# Patient Record
Sex: Female | Born: 1970
Health system: Southern US, Community
[De-identification: ages and names within clinical notes are randomized; demographics above are authoritative.]

## PROBLEM LIST (undated history)

## (undated) DIAGNOSIS — F329 Major depressive disorder, single episode, unspecified: Secondary | ICD-10-CM

## (undated) DIAGNOSIS — M1990 Unspecified osteoarthritis, unspecified site: Secondary | ICD-10-CM

## (undated) DIAGNOSIS — I959 Hypotension, unspecified: Secondary | ICD-10-CM

## (undated) DIAGNOSIS — M797 Fibromyalgia: Secondary | ICD-10-CM

## (undated) DIAGNOSIS — G43909 Migraine, unspecified, not intractable, without status migrainosus: Secondary | ICD-10-CM

## (undated) DIAGNOSIS — F32A Depression, unspecified: Secondary | ICD-10-CM

## (undated) DIAGNOSIS — D259 Leiomyoma of uterus, unspecified: Secondary | ICD-10-CM

## (undated) DIAGNOSIS — K589 Irritable bowel syndrome without diarrhea: Secondary | ICD-10-CM

## (undated) DIAGNOSIS — J351 Hypertrophy of tonsils: Secondary | ICD-10-CM

## (undated) DIAGNOSIS — J45909 Unspecified asthma, uncomplicated: Secondary | ICD-10-CM

## (undated) DIAGNOSIS — F419 Anxiety disorder, unspecified: Secondary | ICD-10-CM

## (undated) DIAGNOSIS — F319 Bipolar disorder, unspecified: Secondary | ICD-10-CM

## (undated) DIAGNOSIS — D649 Anemia, unspecified: Secondary | ICD-10-CM

## (undated) DIAGNOSIS — F4310 Post-traumatic stress disorder, unspecified: Secondary | ICD-10-CM

## (undated) DIAGNOSIS — G4730 Sleep apnea, unspecified: Secondary | ICD-10-CM

## (undated) DIAGNOSIS — K219 Gastro-esophageal reflux disease without esophagitis: Secondary | ICD-10-CM

## (undated) DIAGNOSIS — M109 Gout, unspecified: Secondary | ICD-10-CM

## (undated) HISTORY — DX: Fibromyalgia: M79.7

## (undated) HISTORY — PX: ENDOMETRIAL ABLATION: SHX621

## (undated) HISTORY — PX: TUBAL LIGATION: SHX77

## (undated) HISTORY — DX: Gout, unspecified: M10.9

## (undated) HISTORY — DX: Unspecified osteoarthritis, unspecified site: M19.90

## (undated) HISTORY — DX: Unspecified asthma, uncomplicated: J45.909

---

## 2000-12-05 ENCOUNTER — Other Ambulatory Visit: Admission: RE | Admit: 2000-12-05 | Discharge: 2000-12-05 | Payer: Self-pay | Admitting: Obstetrics and Gynecology

## 2001-02-05 ENCOUNTER — Ambulatory Visit (HOSPITAL_COMMUNITY): Admission: RE | Admit: 2001-02-05 | Discharge: 2001-02-05 | Payer: Self-pay | Admitting: Obstetrics and Gynecology

## 2001-02-05 ENCOUNTER — Encounter: Payer: Self-pay | Admitting: Obstetrics and Gynecology

## 2001-03-01 ENCOUNTER — Ambulatory Visit (HOSPITAL_COMMUNITY): Admission: RE | Admit: 2001-03-01 | Discharge: 2001-03-01 | Payer: Self-pay | Admitting: Obstetrics and Gynecology

## 2001-04-06 ENCOUNTER — Ambulatory Visit (HOSPITAL_COMMUNITY): Admission: AD | Admit: 2001-04-06 | Discharge: 2001-04-06 | Payer: Self-pay | Admitting: Obstetrics and Gynecology

## 2001-04-19 ENCOUNTER — Ambulatory Visit (HOSPITAL_COMMUNITY): Admission: AD | Admit: 2001-04-19 | Discharge: 2001-04-19 | Payer: Self-pay | Admitting: Obstetrics and Gynecology

## 2001-04-27 ENCOUNTER — Inpatient Hospital Stay (HOSPITAL_COMMUNITY): Admission: RE | Admit: 2001-04-27 | Discharge: 2001-04-29 | Payer: Self-pay | Admitting: Obstetrics and Gynecology

## 2001-06-11 ENCOUNTER — Ambulatory Visit (HOSPITAL_COMMUNITY): Admission: RE | Admit: 2001-06-11 | Discharge: 2001-06-11 | Payer: Self-pay | Admitting: Obstetrics and Gynecology

## 2001-08-28 ENCOUNTER — Encounter (HOSPITAL_COMMUNITY): Admission: RE | Admit: 2001-08-28 | Discharge: 2001-08-28 | Payer: Self-pay | Admitting: Preventative Medicine

## 2001-08-28 ENCOUNTER — Encounter (HOSPITAL_COMMUNITY): Admission: RE | Admit: 2001-08-28 | Discharge: 2001-09-27 | Payer: Self-pay | Admitting: Preventative Medicine

## 2001-11-29 ENCOUNTER — Inpatient Hospital Stay (HOSPITAL_COMMUNITY): Admission: EM | Admit: 2001-11-29 | Discharge: 2001-12-07 | Payer: Self-pay | Admitting: Psychiatry

## 2005-07-13 ENCOUNTER — Ambulatory Visit (HOSPITAL_COMMUNITY): Admission: RE | Admit: 2005-07-13 | Discharge: 2005-07-13 | Payer: Self-pay | Admitting: Obstetrics and Gynecology

## 2005-09-09 ENCOUNTER — Ambulatory Visit (HOSPITAL_COMMUNITY): Admission: RE | Admit: 2005-09-09 | Discharge: 2005-09-09 | Payer: Self-pay | Admitting: Obstetrics and Gynecology

## 2009-04-30 ENCOUNTER — Other Ambulatory Visit: Admission: RE | Admit: 2009-04-30 | Discharge: 2009-04-30 | Payer: Self-pay | Admitting: Obstetrics and Gynecology

## 2010-12-03 NOTE — Op Note (Signed)
[facility]  Patient:    [name], [name] Visit Number: [id number] MRN: [id number]          Service Type: OBS Location: 4 A417 01 Attending Physician:  Tilda Burrow Dictated by:   Christin Bach, M.D. Proc. Date: 04/27/01 Admit Date:  04/27/2001 Discharge Date: 04/29/2001                             Operative Report  DELIVERY NOTE  DATE OF DELIVERY:  9:15 p.m.  DESCRIPTION OF PROCEDURE:  Ms. Ord progressed nicely reaching completely dilated at approximately 9 p.m.  A brief second stage of less than 15 minutes resulted in spontaneous vertex vaginal delivery.  The infant started second stage in the right occipitoposterior position and rotated spontaneously to right occipitoanterior as the baby descending with the patient pushing with excellent effort while placing legs in the McRoberts flexed position.  She delivered over an intact perineum a healthy female infant, Apgars 8/8, -1 for color and tone at 5 minutes.  The baby required a little extra oxygen due to transient cyanosis and decreased tone.  The baby responded to tactile stimulation with adequate response.  Mother and infant did well.  Placenta delivered easily.  Tomasa Blase presentation.  Cord blood gases were obtained and are pending at the time of this dictation. Dictated by:   Christin Bach, M.D. Attending Physician:  Tilda Burrow DD:  04/27/01 TD:  04/29/01 Job: [id number]

## 2010-12-03 NOTE — Discharge Summary (Signed)
[facility]  Patient:    [name], [name] Visit Number: [id number] MRN: [id number]          Service Type: OBS Location: 4 A417 01 Attending Physician:  Tilda Burrow Dictated by:   Christin Bach, M.D. Admit Date:  04/27/2001 Discharge Date: 04/29/2001                             Discharge Summary  ADMITTING DIAGNOSIS:  Pregnancy [redacted] weeks gestation, latent phase labor.  DISCHARGE DIAGNOSIS:  Pregnancy 38 weeks, delivered.  PROCEDURE:  Spontaneous vertex vaginal delivery.  Intact perineum, 7 pounds 0.2 ounce female infant, Apgars 7 and 9.  DISCHARGE MEDICATIONS: 1. Motrin 800 mg 1 p.o. q.8h. as needed for pain. 2. Future contraception.  PLAN:  Postpartum tubal ligation planned.  HISTORY OF PRESENT ILLNESS:  This 40 year old female gravida 4, para 2, AB 1, due July 03, 2001, was admitted on April 27, 2001, with regular uterine contractions of mild nature every 3 minutes.  Cervix was 2 cm 20% soft, posterior, easily stretchable.  The patient was admitted for labor management and Pitocin augmentation of labor.  HOSPITAL COURSE:  The patient was admitted and progressed nicely through labor reaching completely dilated at shortly before 9 a.m. and delivered at 9:15 a.m. over an intact perineum, delivered a 7 pound 0.2 ounce baby with little effort.  The patient had only been in the hospital for only 3 hours.  She was admitted at 6 p.m.  POSTPARTUM COURSE:  Uneventful with patient staying 2 days, breast feeding. Was discharged for follow up in 4 weeks for postpartum tubal ligation. Dictated by:   Christin Bach, M.D. Attending Physician:  Tilda Burrow DD:  06/03/01 TD:  06/03/01 Job: [id number]

## 2010-12-03 NOTE — Op Note (Signed)
[facility]  Patient:    [name], [name] Visit Number: [id number] MRN: [id number]          Service Type: OBS Location: 4 A417 01 Attending Physician:  Tilda Burrow Dictated by:   Christin Bach, M.D. Admit Date:  04/27/2001 Discharge Date: 04/29/2001                             Operative Report  PREOPERATIVE DIAGNOSES:  Elective sterilization.  POSTOPERATIVE DIAGNOSES:  Elective sterilization.  PROCEDURE:  Laparoscopic tubal sterilization with Falope rings.  SURGEON:  Christin Bach, M.D.  ASSISTANT:  None.  ANESTHESIA:  General.  COMPLICATIONS:  None.  FINDINGS:  Normal-appearing tubes and ovaries bilaterally.  Video camera used for procedure to allow visualization by students in the room.  INDICATION:  Elective permanent sterilization.  DETAILS OF PROCEDURE:  The patient was taken to the operating room, prepped and draped for a combined abdominal and vaginal procedure, with Hulka tenaculum attached to the cervix for uterine manipulation. Bladder in-and-out catheterization. An infraumbilical, 1 cm vertical incision, as well as a transverse suprapubic 1 cm incision. Veress needle was used to introduce pneumoperitoneum through the umbilical incision with the pneumoperitoneum easily introduced under 10 mmHg of pressure. Introduction of the Veress needle was done, carefully elevating the abdominal wall and orienting the needle toward the pelvis.  The laparoscopic trocar was then carefully introduced into the abdomen using a similar technique, and the laparoscope was used to visualize normal pelvic anatomy with no evidence of bleeding or trauma. The suprapubic trocar was introduced under direct visualization, and then attention was directed to the left fallopian tube, which was identified up to its fimbriated end, elevated and a mid-segment loop of the tube was drawn up into the Falope ring applier, Marcaine 0.25% applied to the  surface of the tube and the Falope ring applied, inspected, and found to be in satisfactory position. The opposite tube was then treated in a similar fashion. The mesosalpinx beneath the Falope ring on each side was then infiltrated with approximately 3 cc of Marcaine 0.25%, using a transabdominal approach with a 22-gauge spinal needle.  Then, the laparoscopic equipment was removed after instilling 200 cc of saline into the abdomen and deflating the abdomen. Subcuticular 4-0 Dexon was used to close the skin incisions and Steri-Strips was placed on the skin surface. Sponge and needle counts were correct. The patient tolerated the procedure well, was awakened, and went to the recovery room in good condition. Dictated by:   Christin Bach, M.D. Attending Physician:  Tilda Burrow DD:  06/11/01 TD:  06/11/01 Job: [id number]

## 2010-12-03 NOTE — H&P (Signed)
[facility]  Patient:    [name], [name] Visit Number: [id number] MRN: [id number]          Service Type: OBS Location: 4 A417 01 Attending Physician:  Tilda Burrow Dictated by:   Christin Bach, M.D. Admit Date:  04/27/2001 Discharge Date: 04/29/2001                           History and Physical  PREOPERATIVE DIAGNOSES:  Elective sterilization.  HISTORY OF PRESENT ILLNESS:  This 40 year old female gravida 4, para 3, AB1 recently status post vaginal delivery April 27, 2001 is admitted at this time for elective permanent sterilization by Falope ring application.  The patient understands the failure rates of the requested procedure is 1 in 100. The patient has been abstinent since delivery.  Confirms again her desire for permanent sterilization.  She has signed appropriate medicaid sterilization forms May 02, 2001 and affirms that her attitude remains unchanged. Technical aspects of the procedure have been reviewed with 1 in 100 failure rate quoted.  PAST MEDICAL HISTORY:  History of HPV, history of HSV (not currently active), asthma in the past.  PAST SURGICAL HISTORY:  Negative.  ALLERGIES:  PENICILLIN causes rash.  PHYSICAL EXAMINATION  VITAL SIGNS:  Height 5 feet 9 inches, weight 195 pounds, blood pressure 100/60.  HEENT:  Pupils are equal, round and reactive.  Extraocular movements are intact.  NECK:  Supple.  Trachea midline.  CHEST:  Clear to auscultation.  ABDOMEN:  Nontender.  PELVIC:  External genitalia:  Multiparous, well supported.  Vaginal examination normal, good length, normal postpartum lochia.  Atrophic vaginal tissues as the patient is breast-feeding.  Cervix:  Multiparous.  Uterus: Mobile, deep in pelvis, involuting nicely, upper limits normal size for postpartum state.  Adnexa:  Negative for masses.  LABORATORIES:  Wet prep negative.  GC, chlamydia pending.  ASSESSMENT:  Desire for elective permanent  sterilization.  PLAN:  Falope ring application June 11, 2001. Dictated by:   Christin Bach, M.D. Attending Physician:  Tilda Burrow DD:  06/05/01 TD:  06/05/01 Job: [id number]

## 2010-12-03 NOTE — Op Note (Signed)
[name], [name]           ACCOUNT NO.:  [id number]   MEDICAL RECORD NO.:  [id number]          PATIENT TYPE:  AMB   LOCATION:  DAY                           FACILITY:  [facility]   PHYSICIAN:  Tilda Burrow, M.D. DATE OF BIRTH:  January 25, 1971   DATE OF PROCEDURE:  09/09/2005  DATE OF DISCHARGE:                                 OPERATIVE REPORT   PREOPERATIVE DIAGNOSIS:  Menometrorrhagia.   POSTOPERATIVE DIAGNOSIS:  Menometrorrhagia.   PROCEDURE:  Hysteroscopy, endometrial ablation.   SURGEON:  Tilda Burrow, M.D.   ASSISTANT:  None.   ANESTHESIA:  General with laryngeal mask airway.   COMPLICATIONS:  None.   FINDINGS:  Retroverted uterus sounding to 9 cm. Thin atrophic endometrial  cavity.   DETAILS OF PROCEDURE:  The patient was taken to the operating room, prepped  and draped for a vaginal procedure. In and out catheterization of the  bladder removed 100+ cc of urine. Anterior cervical lip was grasped with a  single-toothed tenaculum. Cervix sounded in the retroverted position to 9  cm, dilated to 25 [place]. The hysteroscope was then introduced, visualizing  the tubal ostial as photographed in photos 1 and 2. The contours of the  uterine cavity were quite smooth with no tissue warranting curettage. We  then proceeded with endometrial ablation. The ablation consisted of placing  the balloon at a depth of 9 cm, inflating it to approximately 15 cc of fluid  volume. The uterus had soft, boggy texture and had to have several  continuations of fluid, as it filled initially about 10 cc of fluid volume  and then gradually would soften. At this time, we were able to stabilize her  to approximately 150 mmHg and proceed with endometrial ablation. The 8-  minute thermal ablation sequence was completed after the water reached the  appropriate 87 degrees Fahrenheit temperature. At the completion of the  procedure, the amount of fluid was completely recovered. Paracervical block  had been applied at the start of the procedure with Marcaine  with epinephrine. The patient allowed to go to the recovery room in good  condition. Sponge and needle counts correct.   Discharge medications include Tylox x20 tablets and Motrin 800 mg x20  tablets.      Tilda Burrow, M.D.  Electronically Signed     JVF/MEDQ  D:  09/09/2005  T:  09/09/2005  Job:  [id number]   cc:   [facility]

## 2010-12-03 NOTE — H&P (Signed)
NAMEKRISTENE, [name]           ACCOUNT NO.:  [id number]   MEDICAL RECORD NO.:  [id number]          PATIENT TYPE:  AMB   LOCATION:  DAY                           FACILITY:  [facility]   PHYSICIAN:  Tilda Burrow, M.D. DATE OF BIRTH:  09/10/1970   DATE OF ADMISSION:  09/09/2005  DATE OF DISCHARGE:  LH                                HISTORY & PHYSICAL   ADMISSION DIAGNOSIS:  Menorrhagia requesting endometrial ablation.   HISTORY OF PRESENT ILLNESS:  This 40 year old female is admitted at this  time for endometrial ablation.  [name] has been followed through our office  and requested endometrial ablation due to heavy menses which she finds  debilitating.  She has been seen in our office complaining of irregular  cycles.  She is status post tubal ligation and she requires oral  contraceptives to control menses.  She wishes to get off these oral  contraceptives due to perception of increased breast discomfort while on  oral contraceptives.  She bleeds up to 16 days with tampons and pads both  required when off oral contraceptives.  She has a history of small uterine  fibroids in the past.   PAST MEDICAL HISTORY:  Anxiety disorder and bipolar type 2, managed by Dr.  Omelia Blackwater.  She currently feels normal.   MEDICATIONS:  1.  Geodon.  2.  Xanax 3 mg per day.  3.  Lamictal 200 mg per day.  4.  Zonegran.   SOCIAL HISTORY:  She is not working.  She is in the school system learning  to speak Spanish.  She has reviewed the endometrial ablation technique using  video tapes and brochures.  Vaginal ultrasound has been performed in our  office showing a 9.2 cm long uterus with a 9 mm, thick, endometrial stripe  during the follicular phase.   PHYSICAL EXAMINATION:  PELVIC:  She has left adnexal ovaries that are 3.3 x  2.7 cm.  Right adnexa with smaller ovaries with no masses.  There is no  gross fibroid deformity of the uterus.   IMPRESSION:  Menorrhagia warranting endometrial ablation.   PLAN:  Hysteroscopy with endometrial ablation to be performed on February  23, at noon.      Tilda Burrow, M.D.  Electronically Signed     JVF/MEDQ  D:  09/06/2005  T:  09/06/2005  Job:  [id number]   cc:   [facility]   Omelia Blackwater, M.D.

## 2010-12-03 NOTE — Discharge Summary (Signed)
[facility]  Patient:    [name], [name] Visit Number: [id number] MRN: [id number]          Service Type: PSY Location: 300 0302 01 Attending Physician:  Jeanice Lim Dictated by:   Reymundo Poll Dub Mikes, M.D. Admit Date:  11/29/2001 Discharge Date: 12/07/2001                             Discharge Summary  CHIEF COMPLAINT AND PRESENT ILLNESS:  This was the first admission to [facility] for this 40 year old female voluntarily admitted.  Two children by her husband.  Separated from the husband 10 months prior to this admission.  No support.  She found out that he has been supporting another child at least 1-2 years.  History of depression with suicidal attempts.  Not treated.  On Effexor since Nov 15, 2001.  Tolerating okay but not effective enough.  Homicidal thoughts over the husband.  Decreased concentration, cannot stop thinking about him, increased tearfulness.  Denies suicidal ideation.  PAST PSYCHIATRIC HISTORY:  Medications by her OB.  Celexa while pregnant. Prozac up to 40 mg, did good, Effexor, worked, could not start.  ALCOHOL/DRUG HISTORY:  Denies the use or abuse of any substances.  MEDICAL HISTORY:  The patient has bouts of asthma, active herpes II.  MEDICATIONS:  Prenatal vitamins, Effexor XR 75 mg daily.  PHYSICAL EXAMINATION:  Performed and failed to show any acute findings.  MENTAL STATUS EXAMINATION:  Healthy, African-American female, fully alert. Speech normal, no pressure, articulates spontaneous.  Mood depressed, angry. Affect depressed, angry.  Homicidal ideation towards her husband.  No suicidal ideation.  No auditory or visual hallucinations.  Cognition well-preserved.  ADMISSION DIAGNOSES: Axis I:    Major depression, recurrent, severe. Axis II:   No diagnosis. Axis III:  1. Breast-feeding mother.            2. Herpes II. Axis IV:   Moderate. Axis V:    Global Assessment of Functioning upon  admission 30; highest Global            Assessment of Functioning in the last year 66-70.  LABORATORY DATA:  CBC was within normal limits.  Blood chemistries were within normal limits.  Thyroid profile was within normal limits.  Drug screen was negative for substances of abuse.  HOSPITAL COURSE:  She was admitted and started intensive individual and group psychotherapy.  She was kept on Effexor 75 mg per day and Effexor was increased to 112.5 mg per day.  She was given Ambien for sleep.  She started working in intensive individual and group psychotherapy.  She was working on the grief of losing this relationship, worked on self and self-esteem, on her cognitive distortions, dealing with the feelings toward the husband. Everytime she thought about the situation with her husband, she became very upset.  Tried to get in touch with the husband but he was not available. Slowly, she started accepting the fact that the relationship was pretty much over, started grieving the loss.  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  By Dec 06, 2001, she had decided that she had to let go of the husband, suddenly realized that she had to move on.  On Dec 07, 2001, she claimed that she had done a lot of work and grown up, matured.  While she was in the hospital, she did some grief work, Pharmacologist, challenged her cognitive distortions.  No suicidal  ideation.  No homicidal ideation.  Willing and motivated to pursue outpatient treatment.  DISCHARGE DIAGNOSES: Axis I:    Major depression, recurrent. Axis II:   No diagnosis. Axis III:  1. Breast-feeding mother.            2. Herpes II. Axis IV:   Moderate. Axis V:    Global Assessment of Functioning upon discharge 55-60.  DISCHARGE MEDICATIONS: 1. Effexor XR 112.5 mg daily. 2. Valtrex 500 mg every 12 hours. 3. Ativan 0.5 mg every six hours as needed for anxiety. 4. Ambien as needed for sleep.  FOLLOW-UP:  Dr. Milford Cage and Dr. ________ in the _______  clinic. Dictated by:   Reymundo Poll Dub Mikes, M.D. Attending Physician:  Jeanice Lim DD:  01/09/02 TD:  01/10/02 Job: [id number]

## 2011-12-20 ENCOUNTER — Other Ambulatory Visit: Payer: Self-pay | Admitting: Obstetrics and Gynecology

## 2011-12-20 DIAGNOSIS — Z139 Encounter for screening, unspecified: Secondary | ICD-10-CM

## 2012-01-03 ENCOUNTER — Ambulatory Visit (HOSPITAL_COMMUNITY)
Admission: RE | Admit: 2012-01-03 | Discharge: 2012-01-03 | Disposition: A | Discharge status: Home / Self Care | Payer: Medicare Other | Source: Ambulatory Visit | Attending: Obstetrics and Gynecology | Admitting: Obstetrics and Gynecology

## 2012-01-03 DIAGNOSIS — Z1231 Encounter for screening mammogram for malignant neoplasm of breast: Secondary | ICD-10-CM | POA: Insufficient documentation

## 2012-01-03 DIAGNOSIS — Z139 Encounter for screening, unspecified: Secondary | ICD-10-CM

## 2012-11-26 ENCOUNTER — Other Ambulatory Visit: Payer: Self-pay | Admitting: Obstetrics and Gynecology

## 2012-11-26 DIAGNOSIS — Z139 Encounter for screening, unspecified: Secondary | ICD-10-CM

## 2013-01-07 ENCOUNTER — Ambulatory Visit (HOSPITAL_COMMUNITY)
Admission: RE | Admit: 2013-01-07 | Discharge: 2013-01-07 | Disposition: A | Discharge status: Home / Self Care | Payer: Medicare Other | Source: Ambulatory Visit | Attending: Obstetrics and Gynecology | Admitting: Obstetrics and Gynecology

## 2013-01-07 DIAGNOSIS — Z1231 Encounter for screening mammogram for malignant neoplasm of breast: Secondary | ICD-10-CM | POA: Insufficient documentation

## 2013-01-07 DIAGNOSIS — Z139 Encounter for screening, unspecified: Secondary | ICD-10-CM

## 2013-10-05 ENCOUNTER — Emergency Department (HOSPITAL_COMMUNITY)
Admission: EM | Admit: 2013-10-05 | Discharge: 2013-10-06 | Disposition: A | Discharge status: Other Acute Inpatient Hospital | Payer: Medicare Other | Attending: Emergency Medicine | Admitting: Emergency Medicine

## 2013-10-05 ENCOUNTER — Encounter (HOSPITAL_COMMUNITY): Payer: Self-pay | Admitting: Emergency Medicine

## 2013-10-05 DIAGNOSIS — Z8719 Personal history of other diseases of the digestive system: Secondary | ICD-10-CM | POA: Insufficient documentation

## 2013-10-05 DIAGNOSIS — R443 Hallucinations, unspecified: Secondary | ICD-10-CM | POA: Insufficient documentation

## 2013-10-05 DIAGNOSIS — F911 Conduct disorder, childhood-onset type: Secondary | ICD-10-CM | POA: Insufficient documentation

## 2013-10-05 DIAGNOSIS — F329 Major depressive disorder, single episode, unspecified: Secondary | ICD-10-CM

## 2013-10-05 DIAGNOSIS — R4589 Other symptoms and signs involving emotional state: Secondary | ICD-10-CM | POA: Insufficient documentation

## 2013-10-05 DIAGNOSIS — F32A Depression, unspecified: Secondary | ICD-10-CM

## 2013-10-05 DIAGNOSIS — F411 Generalized anxiety disorder: Secondary | ICD-10-CM | POA: Insufficient documentation

## 2013-10-05 DIAGNOSIS — H5316 Psychophysical visual disturbances: Secondary | ICD-10-CM | POA: Insufficient documentation

## 2013-10-05 DIAGNOSIS — Z79899 Other long term (current) drug therapy: Secondary | ICD-10-CM | POA: Insufficient documentation

## 2013-10-05 DIAGNOSIS — R0602 Shortness of breath: Secondary | ICD-10-CM | POA: Insufficient documentation

## 2013-10-05 DIAGNOSIS — IMO0002 Reserved for concepts with insufficient information to code with codable children: Secondary | ICD-10-CM | POA: Insufficient documentation

## 2013-10-05 DIAGNOSIS — Z3202 Encounter for pregnancy test, result negative: Secondary | ICD-10-CM | POA: Insufficient documentation

## 2013-10-05 DIAGNOSIS — G43909 Migraine, unspecified, not intractable, without status migrainosus: Secondary | ICD-10-CM | POA: Insufficient documentation

## 2013-10-05 DIAGNOSIS — F430 Acute stress reaction: Secondary | ICD-10-CM | POA: Insufficient documentation

## 2013-10-05 DIAGNOSIS — Z880 Allergy status to penicillin: Secondary | ICD-10-CM | POA: Insufficient documentation

## 2013-10-05 DIAGNOSIS — F319 Bipolar disorder, unspecified: Secondary | ICD-10-CM | POA: Insufficient documentation

## 2013-10-05 DIAGNOSIS — Z8742 Personal history of other diseases of the female genital tract: Secondary | ICD-10-CM | POA: Insufficient documentation

## 2013-10-05 DIAGNOSIS — G479 Sleep disorder, unspecified: Secondary | ICD-10-CM | POA: Insufficient documentation

## 2013-10-05 DIAGNOSIS — Z862 Personal history of diseases of the blood and blood-forming organs and certain disorders involving the immune mechanism: Secondary | ICD-10-CM | POA: Insufficient documentation

## 2013-10-05 HISTORY — DX: Depression, unspecified: F32.A

## 2013-10-05 HISTORY — DX: Anemia, unspecified: D64.9

## 2013-10-05 HISTORY — DX: Hypotension, unspecified: I95.9

## 2013-10-05 HISTORY — DX: Irritable bowel syndrome, unspecified: K58.9

## 2013-10-05 HISTORY — DX: Migraine, unspecified, not intractable, without status migrainosus: G43.909

## 2013-10-05 HISTORY — DX: Anxiety disorder, unspecified: F41.9

## 2013-10-05 HISTORY — DX: Bipolar disorder, unspecified: F31.9

## 2013-10-05 HISTORY — DX: Major depressive disorder, single episode, unspecified: F32.9

## 2013-10-05 HISTORY — DX: Leiomyoma of uterus, unspecified: D25.9

## 2013-10-05 LAB — CBC WITH DIFFERENTIAL/PLATELET
Basophils Absolute: 0 10*3/uL (ref 0.0–0.1)
Basophils Relative: 0 % (ref 0–1)
Eosinophils Absolute: 0 10*3/uL (ref 0.0–0.7)
Eosinophils Relative: 1 % (ref 0–5)
HEMATOCRIT: 43.3 % (ref 36.0–46.0)
Hemoglobin: 14.4 g/dL (ref 12.0–15.0)
LYMPHS PCT: 36 % (ref 12–46)
Lymphs Abs: 2.4 10*3/uL (ref 0.7–4.0)
MCH: 30.7 pg (ref 26.0–34.0)
MCHC: 33.3 g/dL (ref 30.0–36.0)
MCV: 92.3 fL (ref 78.0–100.0)
MONO ABS: 0.5 10*3/uL (ref 0.1–1.0)
MONOS PCT: 7 % (ref 3–12)
NEUTROS ABS: 3.7 10*3/uL (ref 1.7–7.7)
Neutrophils Relative %: 56 % (ref 43–77)
Platelets: 177 10*3/uL (ref 150–400)
RBC: 4.69 MIL/uL (ref 3.87–5.11)
RDW: 14.3 % (ref 11.5–15.5)
WBC: 6.6 10*3/uL (ref 4.0–10.5)

## 2013-10-05 LAB — PREGNANCY, URINE: Preg Test, Ur: NEGATIVE

## 2013-10-05 LAB — RAPID URINE DRUG SCREEN, HOSP PERFORMED
AMPHETAMINES: NOT DETECTED
BARBITURATES: NOT DETECTED
BENZODIAZEPINES: POSITIVE — AB
Cocaine: NOT DETECTED
Opiates: NOT DETECTED
Tetrahydrocannabinol: NOT DETECTED

## 2013-10-05 LAB — BASIC METABOLIC PANEL
BUN: 11 mg/dL (ref 6–23)
CHLORIDE: 106 meq/L (ref 96–112)
CO2: 25 meq/L (ref 19–32)
Calcium: 9.3 mg/dL (ref 8.4–10.5)
Creatinine, Ser: 0.99 mg/dL (ref 0.50–1.10)
GFR calc Af Amer: 81 mL/min — ABNORMAL LOW (ref >90)
GFR calc non Af Amer: 70 mL/min — ABNORMAL LOW (ref >90)
GLUCOSE: 93 mg/dL (ref 70–99)
Potassium: 4.3 mEq/L (ref 3.7–5.3)
Sodium: 141 mEq/L (ref 137–147)

## 2013-10-05 LAB — ETHANOL: Alcohol, Ethyl (B): <11 mg/dL (ref 0–11)

## 2013-10-05 MED ORDER — LORAZEPAM 1 MG PO TABS
1.0000 mg | ORAL_TABLET | Freq: Once | ORAL | Status: AC
  Administered 2013-10-05: 1 mg via ORAL
  Filled 2013-10-05: qty 1

## 2013-10-05 NOTE — ED Notes (Signed)
Mother taking all pt belongings home including a ring, clothes, and purse.

## 2013-10-05 NOTE — ED Notes (Signed)
Pt complaining of anxiety. Dr. Dina Rich made aware.

## 2013-10-05 NOTE — ED Notes (Signed)
Pt has decreased anxiety since ativan. Pt requesting regular PM medication. MD made aware

## 2013-10-05 NOTE — ED Provider Notes (Addendum)
CSN: [id number]     Arrival date & time 10/05/13  1501 History  This chart was scribed for Jordan Hacker, MD by Zettie Pho, ED Scribe. This patient was seen in room APA16A/APA16A and the patient's care was started at 4:52 PM.    Chief Complaint  Patient presents with  . V70.1   The history is provided by the patient. No language interpreter was used.   HPI Comments: [name] is a 43 y.o. Female with a history of bipolar disorder, depression, anxiety, migraines who presents to the Emergency Department requesting a psychiatric evaluation for a "nervous breakdown" secondary to environmental stressors (i.e. Financial, children, etc.). Patient states that she has felt angry and frustrated lately, especially at her children, but denies suicidal or homicidal ideations. She reports that she recently had an episode of mania that she states lasted about 2 weeks. Patient also reports that she will intermittently not sleep for several days at a time, then sleep excessively. She reports some associated auditory and visual hallucinations that she states only presents during times of sleep deprivation and are paranoid in nature. She reports some associated shortness of breath secondary to her anxiety. She reports that she has not been able to follow up with her psychiatrist (Dr. Bernita Raisin) due to complications with her health insurance. Patient reports that she takes Zonisamide, Alprazolam, zolpridem tartrate, and Prestique daily, which she states she has been compliant with. She denies any other drug or alcohol use.  Patient also has a history of IBS, hypotension, anemia, and uterine fibroid.  Past Medical History  Diagnosis Date  . IBS (irritable bowel syndrome)   . Bipolar disorder   . Depression   . Anxiety   . Migraine   . Hypotension   . Anemia   . Uterine fibroid    Past Surgical History  Procedure Laterality Date  . Endometrial ablation    . Tubal ligation     No family  history on file. History  Substance Use Topics  . Smoking status: Never Smoker   . Smokeless tobacco: Not on file  . Alcohol Use: No   OB History   Grav Para Term Preterm Abortions TAB SAB Ect Mult Living                 Review of Systems  Constitutional: Negative for fever.  Respiratory: Negative for cough, chest tightness and shortness of breath.   Cardiovascular: Negative for chest pain.  Gastrointestinal: Negative for nausea, vomiting and abdominal pain.  Genitourinary: Negative for dysuria.  Musculoskeletal: Negative for back pain.  Skin: Negative for wound.  Neurological: Negative for headaches.  Psychiatric/Behavioral: Positive for hallucinations, sleep disturbance and agitation. Negative for suicidal ideas and confusion. The patient is nervous/anxious.   All other systems reviewed and are negative.      Allergies  Penicillins and Sweet potato  Home Medications   Current Outpatient Rx  Name  Route  Sig  Dispense  Refill  . ABILIFY MAINTENA 400 MG SUSR   Intramuscular   Inject 400 mg into the muscle every 21 ( twenty-one) days.         Marland Kitchen alprazolam (XANAX) 2 MG tablet   Oral   Take 2 mg by mouth 3 (three) times daily as needed.         Marland Kitchen amantadine (SYMMETREL) 100 MG capsule   Oral   Take 100 mg by mouth daily.         . fluticasone (FLONASE) 50  MCG/ACT nasal spray   Each Nare   Place 1-2 sprays into both nostrils daily as needed.         Marland Kitchen HYDROcodone-acetaminophen (NORCO/VICODIN) 5-325 MG per tablet   Oral   Take 1 tablet by mouth every 8 (eight) hours as needed. For pain         . omeprazole (PRILOSEC) 20 MG capsule   Oral   Take 20 mg by mouth 2 (two) times daily.         Marland Kitchen PRISTIQ 50 MG 24 hr tablet   Oral   Take 50 mg by mouth daily.         . propranolol (INDERAL) 40 MG tablet   Oral   Take 40 mg by mouth 2 (two) times daily.         . SUMAtriptan (IMITREX) 50 MG tablet   Oral   Take 50 mg by mouth as needed.          . traZODone (DESYREL) 50 MG tablet   Oral   Take 50-100 mg by mouth at bedtime.         . ziprasidone (GEODON) 80 MG capsule   Oral   Take 80 mg by mouth 2 (two) times daily.         Marland Kitchen zolpidem (AMBIEN) 10 MG tablet   Oral   Take 10 mg by mouth at bedtime as needed. For sleep         . zonisamide (ZONEGRAN) 100 MG capsule   Oral   Take 100 mg by mouth 3 (three) times daily.           Triage Vitals: BP 127/63  Pulse 86  Temp(Src) 97.7 F (36.5 C) (Oral)  Resp 20  SpO2 100%  Physical Exam  Nursing note and vitals reviewed. Constitutional: She is oriented to person, place, and time. No distress.  Anxious, tearful  HENT:  Head: Normocephalic and atraumatic.  Eyes: Pupils are equal, round, and reactive to light.  Neck: Neck supple.  Cardiovascular: Normal rate, regular rhythm and normal heart sounds.   No murmur heard. Pulmonary/Chest: Effort normal and breath sounds normal. No respiratory distress. She has no wheezes.  Abdominal: Soft. Bowel sounds are normal. There is no tenderness. There is no rebound.  Neurological: She is alert and oriented to person, place, and time.  Skin: Skin is warm and dry.  Psychiatric:  Flat affect, appropriate insight    ED Course  Procedures (including critical care time)  DIAGNOSTIC STUDIES: Oxygen Saturation is 100% on room air, normal by my interpretation.    COORDINATION OF CARE: 5:00 PM- Ordered blood labs (CBC, BMP, ethanol) and UA. Discussed treatment plan with patient at bedside and patient verbalized agreement.     Labs Review Labs Reviewed  BASIC METABOLIC PANEL - Abnormal; Notable for the following:    GFR calc non Af Amer 70 (*)    GFR calc Af Amer 81 (*)    All other components within normal limits  URINE RAPID DRUG SCREEN (HOSP PERFORMED) - Abnormal; Notable for the following:    Benzodiazepines POSITIVE (*)    All other components within normal limits  CBC WITH DIFFERENTIAL  ETHANOL  PREGNANCY, URINE    Imaging Review No results found.   EKG Interpretation None      MDM   Final diagnoses:  None   Patient presents with increasing agitation, restlessness, sleeplessness. Patient reports that she feels like she did when she required hospitalization several years ago  for bipolar disorder. She's not able to see her outpatient psychiatrist. She's tearful on exam. She appears to have insight. No active HI or SI at this time. Requesting further evaluation. Will send for screening psych labs. TTS to evaluate.  I personally performed the services described in this documentation, which was scribed in my presence. The recorded information has been reviewed and is accurate.     Jordan Hacker, MD 10/05/13 2012  Patient meets inpatient criteria.  Awaiting placement.  Jordan Hacker, MD 10/05/13 2115
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
## 2013-10-05 NOTE — BH Assessment (Signed)
Received a call for tele-assessment. Spoke with Dr. Dina Rich who stated that pt has a history of bipolar and a recent manic episode. Pt reported she has not been sleeping. Pt is asking for help. No SI/HI reported. Pt reported that the last time she was like this she needed to be hospitalized. Pt has a primary psychiatrist but is no longer able to see him due to her insurance coverage. Tele-assessment will be initiated.

## 2013-10-05 NOTE — ED Notes (Signed)
Telepsych completed.  

## 2013-10-05 NOTE — ED Notes (Signed)
Pt presents to er for further evaluation of "nervous breakdown", pt states that she has had to change doctors, had a change in medications. Feels as if she is losing her support system, has "alot on her" due to finances, dealing with her children, when asked about any thoughts of SI or HI, pt's reply is "I feel anger and have been having a lot of anger and frustration, but would never hurt anyone or myself" pt tearful in triage, admits to problems with her sleep, states that she will have auditory hallucinations when she does not sleep for days at a time,

## 2013-10-06 MED ORDER — ZIPRASIDONE HCL 80 MG PO CAPS
80.0000 mg | ORAL_CAPSULE | Freq: Three times a day (TID) | ORAL | Status: DC
  Filled 2013-10-06 (×5): qty 1

## 2013-10-06 MED ORDER — ZONISAMIDE 100 MG PO CAPS
100.0000 mg | ORAL_CAPSULE | Freq: Three times a day (TID) | ORAL | Status: DC
  Administered 2013-10-06: 100 mg via ORAL
  Filled 2013-10-06 (×8): qty 1

## 2013-10-06 MED ORDER — VENLAFAXINE HCL ER 75 MG PO CP24
ORAL_CAPSULE | ORAL | Status: AC
  Filled 2013-10-06: qty 1

## 2013-10-06 MED ORDER — ZONISAMIDE 100 MG PO CAPS
100.0000 mg | ORAL_CAPSULE | Freq: Once | ORAL | Status: DC
  Filled 2013-10-06: qty 1

## 2013-10-06 MED ORDER — AMANTADINE HCL 100 MG PO CAPS
ORAL_CAPSULE | ORAL | Status: AC
  Filled 2013-10-06: qty 1

## 2013-10-06 MED ORDER — ZIPRASIDONE HCL 80 MG PO CAPS
80.0000 mg | ORAL_CAPSULE | Freq: Two times a day (BID) | ORAL | Status: DC
  Administered 2013-10-06 (×2): 80 mg via ORAL
  Filled 2013-10-06 (×6): qty 1

## 2013-10-06 MED ORDER — ZIPRASIDONE MESYLATE 20 MG IM SOLR: 80.0000 mg | Freq: Two times a day (BID) | INTRAMUSCULAR | Status: DC

## 2013-10-06 MED ORDER — ZIPRASIDONE HCL 80 MG PO CAPS
ORAL_CAPSULE | ORAL | Status: AC
  Filled 2013-10-06: qty 1

## 2013-10-06 MED ORDER — ZOLPIDEM TARTRATE 5 MG PO TABS
10.0000 mg | ORAL_TABLET | Freq: Every evening | ORAL | Status: DC | PRN
  Administered 2013-10-06: 10 mg via ORAL
  Filled 2013-10-06: qty 2

## 2013-10-06 MED ORDER — AMANTADINE HCL 100 MG PO CAPS
100.0000 mg | ORAL_CAPSULE | Freq: Every day | ORAL | Status: DC
  Administered 2013-10-06: 100 mg via ORAL
  Filled 2013-10-06 (×3): qty 1

## 2013-10-06 MED ORDER — VENLAFAXINE HCL ER 75 MG PO CP24
75.0000 mg | ORAL_CAPSULE | Freq: Every day | ORAL | Status: DC
  Filled 2013-10-06 (×2): qty 1

## 2013-10-06 NOTE — ED Notes (Addendum)
Patient allowed to visit with family prior transfer to [facility].

## 2013-10-06 NOTE — Progress Notes (Signed)
MHT initiated psychiatric placement at the following hospitals with bed availability:  1)[facility]-faxed referral 2)[facility]-faxed referral 3)[facility]-faxed referral 4)[facility]-faxed referral 5)[facility]-faxed referral 6)[facility]-faxed referral 7)[facility]-faxed referral.  Has been reviewed with Dr.Chafeur.  If under IVC, callback in AM with bed placement.  Wyvonnia Dusky, MHT/NS

## 2013-10-06 NOTE — ED Notes (Signed)
Received call from [facility], states patient is accepted at [facility] pending IVC paperwork. Per [facility], [facility] requires patient to be IVC.

## 2013-10-06 NOTE — ED Notes (Signed)
Patient medications received from [facility]. Patient now sleeping at this time. Will administer medications when patient awakens.

## 2013-10-06 NOTE — ED Notes (Signed)
Completed IVC paperwork, faxed to clerk of court and to [facility].

## 2013-10-06 NOTE — ED Notes (Signed)
Patient requesting night time medication. Checked with Dr. Dina Rich regarding placing orders. MD stated would place orders for patient's medication as soon as possible. Patient notified and complaining of increasing anxiety, MD made aware.

## 2013-10-06 NOTE — ED Notes (Signed)
Patient left ED at this time with [facility]. No distress upon departure.

## 2013-10-06 NOTE — ED Notes (Signed)
Patient sleeping in bed. Equal chest rise and fall noted.

## 2013-10-06 NOTE — ED Notes (Signed)
Report given to Myrna Blazer, Therapist, sports at [facility] to transport patient to facility.

## 2013-10-06 NOTE — Progress Notes (Signed)
Placed follow-up call to [facility] regarding pt status, spoke with RN, states that pt has not been run by MD as of yet but will call TTS back once reviewed.   Rick Duff Disposition MHT

## 2013-10-06 NOTE — Progress Notes (Signed)
Per Vicente Serene pt has been accepted to [facility] by Dr. Ruthell Rummage pending IVC.  Report number is [phone].  Information was given to pt's nurse Tiffany at [place].   Rick Duff Disposition MHT

## 2013-11-07 ENCOUNTER — Encounter (HOSPITAL_COMMUNITY): Payer: Self-pay | Admitting: Psychiatry

## 2013-11-07 ENCOUNTER — Ambulatory Visit (INDEPENDENT_AMBULATORY_CARE_PROVIDER_SITE_OTHER): Payer: Medicare Other | Admitting: Psychiatry

## 2013-11-07 VITALS — BP 124/80 | Ht 68.0 in | Wt 219.0 lb

## 2013-11-07 DIAGNOSIS — F3160 Bipolar disorder, current episode mixed, unspecified: Secondary | ICD-10-CM

## 2013-11-07 DIAGNOSIS — F3162 Bipolar disorder, current episode mixed, moderate: Secondary | ICD-10-CM

## 2013-11-07 DIAGNOSIS — F4310 Post-traumatic stress disorder, unspecified: Secondary | ICD-10-CM

## 2013-11-07 MED ORDER — ALPRAZOLAM 2 MG PO TABS: 2.0000 mg | ORAL_TABLET | Freq: Three times a day (TID) | ORAL | Status: DC | PRN

## 2013-11-07 MED ORDER — ZOLPIDEM TARTRATE 10 MG PO TABS: 10.0000 mg | ORAL_TABLET | Freq: Every evening | ORAL | Status: DC | PRN

## 2013-11-07 MED ORDER — OXCARBAZEPINE 300 MG PO TABS: 300.0000 mg | ORAL_TABLET | Freq: Two times a day (BID) | ORAL | Status: DC

## 2013-11-07 MED ORDER — PRAZOSIN HCL 5 MG PO CAPS: 5.0000 mg | ORAL_CAPSULE | Freq: Every day | ORAL | Status: DC

## 2013-11-07 MED ORDER — AMANTADINE HCL 100 MG PO CAPS: 100.0000 mg | ORAL_CAPSULE | Freq: Every day | ORAL | Status: DC

## 2013-11-07 MED ORDER — ZIPRASIDONE HCL 80 MG PO CAPS: 80.0000 mg | ORAL_CAPSULE | Freq: Every day | ORAL | Status: DC
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
## 2013-11-11 ENCOUNTER — Ambulatory Visit (HOSPITAL_COMMUNITY): Payer: Medicare Other | Admitting: Psychology

## 2013-11-11 ENCOUNTER — Telehealth (HOSPITAL_COMMUNITY): Payer: Self-pay | Admitting: *Deleted

## 2013-11-11 NOTE — Telephone Encounter (Signed)
Cut minipress back to 1 mg, limit use of ambien and xanax

## 2013-11-13 ENCOUNTER — Other Ambulatory Visit (HOSPITAL_COMMUNITY): Payer: Self-pay | Admitting: Psychiatry

## 2013-11-13 ENCOUNTER — Telehealth (HOSPITAL_COMMUNITY): Payer: Self-pay | Admitting: *Deleted

## 2013-11-13 MED ORDER — PRAZOSIN HCL 1 MG PO CAPS: 1.0000 mg | ORAL_CAPSULE | Freq: Every day | ORAL | Status: DC

## 2013-11-13 NOTE — Telephone Encounter (Signed)
Reminded to cut down Prazosin to 1 mg at night

## 2013-11-25 ENCOUNTER — Ambulatory Visit (HOSPITAL_COMMUNITY): Payer: Self-pay | Admitting: Psychology

## 2013-12-05 ENCOUNTER — Ambulatory Visit (HOSPITAL_COMMUNITY): Payer: Self-pay | Admitting: Psychiatry

## 2013-12-10 ENCOUNTER — Ambulatory Visit (HOSPITAL_COMMUNITY): Payer: Self-pay | Admitting: Psychiatry

## 2013-12-26 ENCOUNTER — Other Ambulatory Visit (HOSPITAL_COMMUNITY): Payer: Self-pay | Admitting: Family Medicine

## 2013-12-26 DIAGNOSIS — Z1231 Encounter for screening mammogram for malignant neoplasm of breast: Secondary | ICD-10-CM

## 2014-01-09 ENCOUNTER — Ambulatory Visit (HOSPITAL_COMMUNITY)
Admission: RE | Admit: 2014-01-09 | Discharge: 2014-01-09 | Disposition: A | Discharge status: Home / Self Care | Payer: Medicare Other | Source: Ambulatory Visit | Attending: Family Medicine | Admitting: Family Medicine

## 2014-01-09 DIAGNOSIS — Z1231 Encounter for screening mammogram for malignant neoplasm of breast: Secondary | ICD-10-CM

## 2014-02-19 ENCOUNTER — Other Ambulatory Visit (HOSPITAL_COMMUNITY): Payer: Self-pay | Admitting: Psychiatry

## 2014-03-03 ENCOUNTER — Telehealth (HOSPITAL_COMMUNITY): Payer: Self-pay | Admitting: *Deleted

## 2014-03-03 NOTE — Telephone Encounter (Signed)
agree

## 2014-03-03 NOTE — Telephone Encounter (Signed)
lm for pt to call office to make an appt. Pt pharmacy [facility] is requesting medications. number provided. Called pharmacy to inform them pt need an appt before meds (Prazosin 1 mg) can be refilled due to her not being seen since 11-07-13 and canceling and no showing her last two appts. Pharmacy agreed.

## 2014-12-10 ENCOUNTER — Other Ambulatory Visit (HOSPITAL_COMMUNITY): Payer: Self-pay | Admitting: Family Medicine

## 2014-12-10 DIAGNOSIS — Z1231 Encounter for screening mammogram for malignant neoplasm of breast: Secondary | ICD-10-CM

## 2015-01-12 ENCOUNTER — Other Ambulatory Visit (HOSPITAL_COMMUNITY): Payer: Self-pay | Admitting: Family Medicine

## 2015-01-12 ENCOUNTER — Ambulatory Visit (HOSPITAL_COMMUNITY)
Admission: RE | Admit: 2015-01-12 | Discharge: 2015-01-12 | Disposition: A | Discharge status: Home / Self Care | Payer: Medicare Other | Source: Ambulatory Visit | Attending: Family Medicine | Admitting: Family Medicine

## 2015-01-12 DIAGNOSIS — Z1231 Encounter for screening mammogram for malignant neoplasm of breast: Secondary | ICD-10-CM

## 2015-10-21 ENCOUNTER — Other Ambulatory Visit (HOSPITAL_COMMUNITY): Payer: Self-pay | Admitting: Family Medicine

## 2015-12-11 ENCOUNTER — Other Ambulatory Visit (HOSPITAL_COMMUNITY): Payer: Self-pay | Admitting: Family Medicine

## 2015-12-11 DIAGNOSIS — Z1231 Encounter for screening mammogram for malignant neoplasm of breast: Secondary | ICD-10-CM

## 2016-01-04 ENCOUNTER — Ambulatory Visit: Payer: Self-pay | Admitting: Obstetrics and Gynecology

## 2016-01-15 ENCOUNTER — Ambulatory Visit (HOSPITAL_COMMUNITY): Payer: Self-pay

## 2016-01-20 ENCOUNTER — Ambulatory Visit: Payer: Self-pay | Admitting: Obstetrics and Gynecology

## 2016-03-02 ENCOUNTER — Ambulatory Visit (HOSPITAL_COMMUNITY)
Admission: RE | Admit: 2016-03-02 | Discharge: 2016-03-02 | Disposition: A | Discharge status: Home / Self Care | Payer: Medicare Other | Source: Ambulatory Visit | Attending: Family Medicine | Admitting: Family Medicine

## 2016-03-02 DIAGNOSIS — Z1231 Encounter for screening mammogram for malignant neoplasm of breast: Secondary | ICD-10-CM | POA: Insufficient documentation

## 2016-04-25 ENCOUNTER — Ambulatory Visit: Payer: Self-pay | Admitting: Obstetrics and Gynecology

## 2016-09-05 ENCOUNTER — Ambulatory Visit (INDEPENDENT_AMBULATORY_CARE_PROVIDER_SITE_OTHER): Payer: Medicare Other | Admitting: Obstetrics and Gynecology

## 2016-09-05 ENCOUNTER — Encounter: Payer: Self-pay | Admitting: Obstetrics and Gynecology

## 2016-09-05 ENCOUNTER — Ambulatory Visit: Payer: Self-pay | Admitting: Obstetrics and Gynecology

## 2016-09-05 VITALS — BP 128/84 | HR 75 | Ht 67.5 in | Wt 210.0 lb

## 2016-09-05 DIAGNOSIS — N9489 Other specified conditions associated with female genital organs and menstrual cycle: Secondary | ICD-10-CM | POA: Insufficient documentation

## 2016-09-05 DIAGNOSIS — N644 Mastodynia: Secondary | ICD-10-CM

## 2016-09-05 DIAGNOSIS — R252 Cramp and spasm: Secondary | ICD-10-CM | POA: Diagnosis not present

## 2016-09-05 DIAGNOSIS — R635 Abnormal weight gain: Secondary | ICD-10-CM

## 2016-09-05 NOTE — Progress Notes (Signed)
Patient ID: [name], female   DOB: [birth year], 46 y.o.   MRN: [id number]    [facility] Visit  @DATE @            Patient name: [name] MRN [id number]  Date of birth: 1970-09-26  CC & HPI:   Chief Complaint  Patient presents with  . Dysmenorrhea    severe cramps; wt gain; brown dc     [name] is a 46 y.o. female presenting today for bilateral breast tenderness, which is worse at the nipple, for the last year. Pt also reports lower abdominal cramping, unusual weight gain (201 to 210 within 2 weeks), intermittent brown vaginal discharge, and notes that her breasts have gotten smaller. Pt states she has not been sexually active in 4-5 years. She states she has not had a period since her ablation.   Pt also complains of nocturia x2-3 and urgency after consuming fluids.   ROS:  ROS +b/l breast tenderness  +brown vaginal discharge  +lower abdominal cramping  +unusual weight gain  +nocturia, urgency   Pertinent History Reviewed:   Reviewed: Significant for uterine fibroids, endometrial ablation, tubal ligation  Medical         Past Medical History:  Diagnosis Date  . Anemia   . Anxiety   . Bipolar disorder ([place])   . Depression   . Hypotension   . IBS (irritable bowel syndrome)   . Migraine   . Uterine fibroid                               Surgical Hx:    Past Surgical History:  Procedure Laterality Date  . ENDOMETRIAL ABLATION    . TUBAL LIGATION     Medications: Reviewed & Updated - see associated section                       Current Outpatient Prescriptions:  .  alprazolam (XANAX) 2 MG tablet, Take 1 tablet (2 mg total) by mouth 3 (three) times daily as needed., Disp: 90 tablet, Rfl: 2 .  amantadine (SYMMETREL) 100 MG capsule, Take 1 capsule (100 mg total) by mouth at bedtime., Disp: 30 capsule, Rfl: 2 .  buPROPion (WELLBUTRIN XL) 150 MG 24 hr tablet, Take 150 mg by mouth daily., Disp: , Rfl:  .  fluticasone (FLONASE) 50  MCG/ACT nasal spray, Place 1-2 sprays into both nostrils daily as needed., Disp: , Rfl:  .  gabapentin (NEURONTIN) 300 MG capsule, Take 300 mg by mouth 3 (three) times daily., Disp: , Rfl:  .  omeprazole (PRILOSEC) 20 MG capsule, Take 20 mg by mouth 2 (two) times daily., Disp: , Rfl:  .  Oxcarbazepine (TRILEPTAL) 300 MG tablet, Take 1 tablet (300 mg total) by mouth 2 (two) times daily., Disp: 60 tablet, Rfl: 2 .  prazosin (MINIPRESS) 5 MG capsule, Take 1 capsule (5 mg total) by mouth at bedtime., Disp: 30 capsule, Rfl: 2 .  ziprasidone (GEODON) 80 MG capsule, Take 1 capsule (80 mg total) by mouth at bedtime., Disp: 30 capsule, Rfl: 2 .  zolpidem (AMBIEN) 10 MG tablet, Take 1 tablet (10 mg total) by mouth at bedtime as needed. For sleep, Disp: 30 tablet, Rfl: 2 .  HYDROcodone-acetaminophen (NORCO/VICODIN) 5-325 MG per tablet, Take 1-2 tablets by mouth every 8 (eight) hours as needed for moderate pain (migraine pain). For pain, Disp: , Rfl:  .  SUMAtriptan (IMITREX) 50 MG tablet, Take 50 mg by mouth as needed for migraine or headache. , Disp: , Rfl:  .  zonisamide (ZONEGRAN) 100 MG capsule, Take 100 mg by mouth 3 (three) times daily., Disp: , Rfl:    Social History: Reviewed -  reports that she has never smoked. She has never used smokeless tobacco.  Objective Findings:  Vitals: Blood pressure 128/84, pulse 75, height 5' 7.5" (1.715 m), weight 210 lb (95.3 kg).  Physical Examination: General appearance - alert, well appearing, and in no distress Mental status - alert, oriented to person, place, and time Breasts- even tissue, good skin tone, good tissue support  Abdomen - soft, nontender, nondistended, no masses or organomegaly Pelvic -  VULVA: normal appearing vulva with no masses, tenderness or lesions,  VAGINA: normal appearing vagina with normal color and discharge, no lesions,  CERVIX: normal appearing cervix without discharge or lesions, Cervix well supported  UTERUS: uterus is normal  size, shape, consistency, well supported, anteflexed, mild cramping to contact, not enlarged  ADNEXA: normal adnexa in size, nontender and no masses  Assessment & Plan:   A:  1. Pelvic cramping 2. Light bleeding s/p ablation  3. ? Dysmenorrhea  4 urinary urgency 5.  P:  1. Obtain transvaginal and pelvic US, f/u for results in 1 week    By signing my name below, I, Hansel Feinstein, attest that this documentation has been prepared under the direction and in the presence of [name], MD. Electronically Signed: Hansel Feinstein, ED Scribe. 09/05/16. 4:23 PM.  I personally performed the services described in this documentation, which was SCRIBED in my presence. The recorded information has been reviewed and considered accurate. It has been edited as necessary during review. [name], MD

## 2016-09-09 ENCOUNTER — Other Ambulatory Visit: Payer: Self-pay | Admitting: Obstetrics and Gynecology

## 2016-09-09 DIAGNOSIS — N9489 Other specified conditions associated with female genital organs and menstrual cycle: Secondary | ICD-10-CM

## 2016-09-12 ENCOUNTER — Ambulatory Visit (INDEPENDENT_AMBULATORY_CARE_PROVIDER_SITE_OTHER): Payer: Medicare Other

## 2016-09-12 DIAGNOSIS — R102 Pelvic and perineal pain: Secondary | ICD-10-CM | POA: Diagnosis not present

## 2016-09-12 DIAGNOSIS — N854 Malposition of uterus: Secondary | ICD-10-CM | POA: Diagnosis not present

## 2016-09-12 DIAGNOSIS — N9489 Other specified conditions associated with female genital organs and menstrual cycle: Secondary | ICD-10-CM

## 2016-09-12 NOTE — Progress Notes (Signed)
PELVIC US TA/TV: Heterogenous anteverted uterus,no measurable fibroids seen,normal ov's bilat,no free fluid,EEC 3 mm,bilat pelvic pain during ultrasound,ovaries appear mobile

## 2016-09-21 ENCOUNTER — Ambulatory Visit: Payer: Medicare Other | Admitting: Obstetrics and Gynecology

## 2016-09-28 ENCOUNTER — Ambulatory Visit: Payer: Medicare Other | Admitting: Obstetrics and Gynecology

## 2016-09-30 ENCOUNTER — Encounter: Payer: Self-pay | Admitting: Obstetrics and Gynecology

## 2016-09-30 ENCOUNTER — Ambulatory Visit (INDEPENDENT_AMBULATORY_CARE_PROVIDER_SITE_OTHER): Payer: Medicare Other | Admitting: Obstetrics and Gynecology

## 2016-09-30 VITALS — BP 120/80 | HR 80 | Wt 209.0 lb

## 2016-09-30 DIAGNOSIS — N9489 Other specified conditions associated with female genital organs and menstrual cycle: Secondary | ICD-10-CM

## 2016-09-30 MED ORDER — MEDROXYPROGESTERONE ACETATE 10 MG PO TABS: 10.0000 mg | ORAL_TABLET | Freq: Every day | ORAL | 3 refills | Status: DC

## 2016-09-30 NOTE — Progress Notes (Signed)
[facility] Visit  09/30/2016       Patient name: [name] MRN [id number]  Date of birth: 1970/12/09  CC & HPI:  [name] is a 46 y.o. female presenting today to follow up on [place] results. She was last seen on 09/05/16 for abdominal pain and dysmenorrhea.   ROS:  ROS Otherwise negative for acute change except as noted in the HPI.  Pertinent History Reviewed:   Reviewed: Significant for uterine fibroid and anemia.  Medical         Past Medical History:  Diagnosis Date  . Anemia   . Anxiety   . Bipolar disorder ([place])   . Depression   . Hypotension   . IBS (irritable bowel syndrome)   . Migraine   . Uterine fibroid                               Surgical Hx:    Past Surgical History:  Procedure Laterality Date  . ENDOMETRIAL ABLATION    . TUBAL LIGATION     Medications: Reviewed & Updated - see associated section                       Current Outpatient Prescriptions:  .  alprazolam (XANAX) 2 MG tablet, Take 1 tablet (2 mg total) by mouth 3 (three) times daily as needed., Disp: 90 tablet, Rfl: 2 .  buPROPion (WELLBUTRIN XL) 150 MG 24 hr tablet, Take 150 mg by mouth daily., Disp: , Rfl:  .  gabapentin (NEURONTIN) 300 MG capsule, Take 300 mg by mouth 3 (three) times daily., Disp: , Rfl:  .  Oxcarbazepine (TRILEPTAL) 300 MG tablet, Take 1 tablet (300 mg total) by mouth 2 (two) times daily., Disp: 60 tablet, Rfl: 2 .  prazosin (MINIPRESS) 5 MG capsule, Take 1 capsule (5 mg total) by mouth at bedtime., Disp: 30 capsule, Rfl: 2 .  zolpidem (AMBIEN) 10 MG tablet, Take 1 tablet (10 mg total) by mouth at bedtime as needed. For sleep, Disp: 30 tablet, Rfl: 2 .  amantadine (SYMMETREL) 100 MG capsule, Take 1 capsule (100 mg total) by mouth at bedtime. (Patient not taking: Reported on 09/30/2016), Disp: 30 capsule, Rfl: 2 .  fluticasone (FLONASE) 50 MCG/ACT nasal spray, Place 1-2 sprays into both nostrils daily as needed., Disp: , Rfl:  .   HYDROcodone-acetaminophen (NORCO/VICODIN) 5-325 MG per tablet, Take 1-2 tablets by mouth every 8 (eight) hours as needed for moderate pain (migraine pain). For pain, Disp: , Rfl:  .  omeprazole (PRILOSEC) 20 MG capsule, Take 20 mg by mouth 2 (two) times daily., Disp: , Rfl:    Social History: Reviewed -  reports that she has never smoked. She has never used smokeless tobacco.  Objective Findings:  Vitals: Blood pressure 120/80, pulse 80, weight 209 lb (94.8 kg).  Physical Examination: General appearance - alert, well appearing, and in no distress Mental status - alert, oriented to person, place, and time Pelvic - examination not indicated  The provider spent over 15 minutes with the visit, including pre visit review, documentation, with >than 50% spent in counseling and coordination of care.   Assessment & Plan:   A:  1. AUB s/p endometrial ablation, no evidence of endometrial thickening s/p ablation  P:  1. Provera x 14 q 3 months for 1 year  2. Follow up in 1 year or PRN  By signing my name below, I, Evelene Croon, attest that this documentation has been prepared under the direction and in the presence of [name], MD . Electronically Signed: Evelene Croon, Scribe. 09/30/2016. 1:43 PM. I personally performed the services described in this documentation, which was SCRIBED in my presence. The recorded information has been reviewed and considered accurate. It has been edited as necessary during review. [name], MD

## 2016-10-31 ENCOUNTER — Encounter: Payer: Self-pay | Admitting: Family Medicine

## 2016-10-31 ENCOUNTER — Ambulatory Visit (INDEPENDENT_AMBULATORY_CARE_PROVIDER_SITE_OTHER): Payer: Medicare Other | Admitting: Family Medicine

## 2016-10-31 DIAGNOSIS — E669 Obesity, unspecified: Secondary | ICD-10-CM

## 2016-10-31 NOTE — Progress Notes (Signed)
Medical Nutrition Therapy:  Appt start time: 1430 end time:  3016. PCP Idelle Jo, MD  Assessment:  Primary concerns today: Weight management.   Brynnleigh fell 2 months ago, and hurt her back.  Following that, she started to have extreme bilateral heel pain, for which she saw orthopedist Dr. Berline Lopes, who prescribed Naproxen 2, and told her he does not want her to exercise on her feet.  She is concerned the medication may promote weight gain. Last year her psychiatrist had her remove her scale at home b/c she had starting to obsess over her weight, weighing 6 or 7 X day.    Lilit experiences some specific cravings at times, usually sweets or carb-based.  For example, she was obsessed with cheesecake, and she gained 12 lb in a couple weeks when she was eating about 3 cheesecakes per day.  She has a history of bipolar disorder, which also affects her eating at times when she is not well controlled.  She is currently quite stable.  She lives with her two sons, ages 74 and 90, and keeps her 1-YO grandson 4-5 days a week, from 6 AM to 7 PM.    Learning Readiness: Change in progress  Usual eating pattern includes 2 meals and 2 snacks per day. Frequent foods and beverages include water, diet sweet tea, [place], chicken, sweet potatoes, rice, grits, eggs, veg's (usually canned).  Avoided foods include pork, beef, most fish & seafood.   Usual physical activity includes none currently.  24-hr recall: (Up at 8:30 AM) B ( AM)-   --- Snk ( AM)-   water L ( PM)-  water Snk ( PM)-  --- D (4:30PM)-  1 1/4c spagh with grnd [place], 1/2 c swt pot's w/ pineapple&walnuts, 1/2 c green beans, 1 slc bread, 1/2 oz cheese Snk ( PM)-  1 c decaf diet tea Typical day? Yes.    Progress Towards Goal(s):  In progress.   Nutritional Diagnosis:  NB-1.1 Food and nutrition-related knowledge deficit As related to weight management.  As evidenced by dietary pattern not conducive to effective weight management.     Intervention:  Nutrition education.  Handouts given during visit include:  AVS  Demonstrated degree of understanding via:  Teach Back  Barriers to learning/adherence to lifestyle change: Patient reports she has had this restrictive eating pattern for many years, and does not tolerate food early in the day.    Monitoring/Evaluation:  Dietary intake, exercise, and body weight in 4 week(s).

## 2016-10-31 NOTE — Patient Instructions (Addendum)
Goals: 1. Eat at least 3 REAL meals and 1-2 snacks per day.  Aim for no more than 5 hours between eating.  Eat breakfast within one hour of getting up.   - A REAL meal includes at least some protein, some starch, and vegetables and/or fruit.    - Start small with the meals you don't typically eat.   2. Obtain twice as many veg's as protein or carbohydrate foods for both lunch and dinner.  - Vegetables can be fresh, frozen, or canned.  Try to keep some on hand at all times.   3. Drink at least 48 oz of water per day.    This was VISIT ONE OF THREE ALLOWED BY [facility].    Sports Medicine referral:  [place]:  [phone].

## 2016-11-28 ENCOUNTER — Encounter: Payer: Self-pay | Admitting: Family Medicine

## 2016-11-28 ENCOUNTER — Ambulatory Visit (INDEPENDENT_AMBULATORY_CARE_PROVIDER_SITE_OTHER): Payer: Medicare Other | Admitting: Family Medicine

## 2016-11-28 DIAGNOSIS — Z7189 Other specified counseling: Secondary | ICD-10-CM | POA: Diagnosis not present

## 2016-11-28 DIAGNOSIS — E669 Obesity, unspecified: Secondary | ICD-10-CM

## 2016-11-28 NOTE — Progress Notes (Signed)
Medical Nutrition Therapy:  Appt start time: 1330 end time:  1430. PCP Idelle Jo, MD  Assessment:  Primary concerns today: Weight management.   [name] continues to have pretty severe bilateral heel pain, which limits exercise capability.  Wants to find out what treatments insurance will cover before she pursues sports med appt.  She walked 3 days in a row following last nutr appt, but foot pain was too bad after that to continue exercising at all.   Avoided foods include pork, beef, most fish & seafood (disliked); allergic to nuts; lactose-intolerant.    [name] has insomnia often, including about a week of mania each spring, when she rarely sleeps.  Usual sleep time is 11 PM to ~5 AM.    For past 2 1/2 weeks, [name] has been following a diet plan she got from a chiropractor 3 yrs ago (see below).  When I asked her perception of how it is working for her, she said she thinks it is good, despite her weight being up 3 lb since her last appt 4 wks ago.  Not sure how she evaluates this, but we talked about how this food plan can be used with the dietary goals previously suggested for her.    Had a discussion today about the importance of sleep to [name]'s overall health and to her weight management.    24-hr recall:  (Up at 6:30 AM; had not gone to bed till 4 AM b/c of family celebrating) B ( AM)-  --- Snk ( AM)-  --- L ( PM)-  --- Snk ( PM)-  ---   Martin Majestic to a cookout at 3 PM: D (3 PM)-  2 [place] burgers, 1 bun, 1 [place] hot dog, 1 bun, spoonful cheese, bbq sauce, tsp slaw, 12 oz soda Snk (8 PM)-  1 slc [place] wrap on string cheese, water Typical day? No.  For past 2 1/2 weeks, other than yesterday, has been following diet from chiropractor: B-  1/2 c dry quick-cooking oatmeal, 1 tbsp raisins, 1 tsp honey, water, SOMETIMES 4 oz fat-free milk or coffee w/  Splenda OR 2 egg whites (6 tbsp), 2 slc [place] bacon, 1 slc toast, 1 tsp low-fat marg, tomato Snk-  1 slc [place] wrap on string cheese  OR low-sugar yogurt (95 kcal), water  L-  2 slc [place] bacon, spinach, tomato, 1 sandw thin, SOMETIMES 2 chx tenders, veg's, 1/2 c potatoes, water Snk-  1 slc [place] wrap on string cheese OR low-sugar yogurt (95 kcal), water  D-  4 oz lean meat or fish, veg's, sug-free, fat-free vinaigrette, 1 sweet potato, 1/2 tsp butter, water Snk -  1/2 c cottage chse OR 1 slc [place] wrap on string cheese  Progress Towards Goal(s):  In progress.   Nutritional Diagnosis:  Some progress on  NB-1.1 Food and nutrition-related knowledge deficit As related to weight management.  As evidenced by more consistently eating throughout the day, including more "real" foods.    Intervention:  Nutrition education.  Handouts given during visit include:  AVS  Demonstrated degree of understanding via:  Teach Back  Barriers to learning/adherence to lifestyle change: Patient reports she has had this restrictive eating pattern for many years, and does not tolerate food early in the day.    Monitoring/Evaluation:  Dietary intake, exercise, and body weight in 10 week(s).  [employer] will cover only one more MNT appt per calendar year.

## 2016-11-28 NOTE — Patient Instructions (Addendum)
-   Keep a few Lactaid pills in your purse.    - Consistent sleeping and eating times are really important for managing your weight.  Do what you can to get as much sleep as possible, with consistent bedtimes and a consistent bedtime routine.  Talk to your son about NO NOISE if he remains up late at night.    Goals remain the same:  1. Eat at least 3 REAL meals and 1-2 snacks per day.  Aim for no more than 5 hours between eating.  Eat breakfast within one hour of getting up.              - A REAL meal includes at least some protein, some starch, and     vegetables and/or fruit.               - Start small with the meals you don't typically eat.   2. Obtain twice as many veg's as protein or carbohydrate foods for both lunch and dinner.             - Vegetables can be fresh, frozen, or canned.  Try to keep some on hand   at all times.   3. Drink at least 48 oz of water per day.    - Talk to the kids about what foods you need to keep on hand for you specifically.   - TASTE PREFERENCES ARE LEARNED.  This means it will get easier to choose foods you know are good for you if you are exposed to them enough.    - Try roasting or stir-frying vegetables with a small amount of olive oil instead of steaming.  Also, try adding a new vegetable to something well liked and familiar, such as soup or pizza or chili.

## 2017-01-19 ENCOUNTER — Other Ambulatory Visit (HOSPITAL_COMMUNITY): Payer: Self-pay | Admitting: Family Medicine

## 2017-01-19 DIAGNOSIS — Z1231 Encounter for screening mammogram for malignant neoplasm of breast: Secondary | ICD-10-CM

## 2017-02-06 ENCOUNTER — Ambulatory Visit: Payer: Self-pay | Admitting: Family Medicine

## 2017-03-06 ENCOUNTER — Ambulatory Visit (HOSPITAL_COMMUNITY)
Admission: RE | Admit: 2017-03-06 | Discharge: 2017-03-06 | Disposition: A | Discharge status: Home / Self Care | Payer: Medicare Other | Source: Ambulatory Visit | Attending: Family Medicine | Admitting: Family Medicine

## 2017-03-06 DIAGNOSIS — Z1231 Encounter for screening mammogram for malignant neoplasm of breast: Secondary | ICD-10-CM | POA: Insufficient documentation

## 2017-03-08 ENCOUNTER — Other Ambulatory Visit (HOSPITAL_COMMUNITY): Payer: Self-pay | Admitting: Family Medicine

## 2017-03-08 DIAGNOSIS — N6310 Unspecified lump in the right breast, unspecified quadrant: Secondary | ICD-10-CM

## 2017-03-08 DIAGNOSIS — R928 Other abnormal and inconclusive findings on diagnostic imaging of breast: Secondary | ICD-10-CM

## 2017-03-14 ENCOUNTER — Ambulatory Visit (HOSPITAL_COMMUNITY)
Admission: RE | Admit: 2017-03-14 | Discharge: 2017-03-14 | Disposition: A | Discharge status: Home / Self Care | Payer: Medicare Other | Source: Ambulatory Visit | Attending: Family Medicine | Admitting: Family Medicine

## 2017-03-14 ENCOUNTER — Encounter (HOSPITAL_COMMUNITY): Payer: Self-pay

## 2017-03-14 ENCOUNTER — Ambulatory Visit (HOSPITAL_COMMUNITY): Payer: Self-pay

## 2017-03-14 DIAGNOSIS — R928 Other abnormal and inconclusive findings on diagnostic imaging of breast: Secondary | ICD-10-CM | POA: Diagnosis not present

## 2017-03-14 DIAGNOSIS — N6310 Unspecified lump in the right breast, unspecified quadrant: Secondary | ICD-10-CM

## 2017-12-21 ENCOUNTER — Ambulatory Visit (INDEPENDENT_AMBULATORY_CARE_PROVIDER_SITE_OTHER): Payer: Medicare Other | Admitting: Otolaryngology

## 2017-12-21 DIAGNOSIS — J3501 Chronic tonsillitis: Secondary | ICD-10-CM | POA: Diagnosis not present

## 2017-12-21 DIAGNOSIS — K219 Gastro-esophageal reflux disease without esophagitis: Secondary | ICD-10-CM

## 2017-12-21 DIAGNOSIS — R490 Dysphonia: Secondary | ICD-10-CM

## 2017-12-25 ENCOUNTER — Other Ambulatory Visit: Payer: Self-pay | Admitting: Otolaryngology

## 2018-01-01 ENCOUNTER — Other Ambulatory Visit (HOSPITAL_COMMUNITY): Payer: Self-pay | Admitting: Internal Medicine

## 2018-01-01 DIAGNOSIS — Z1231 Encounter for screening mammogram for malignant neoplasm of breast: Secondary | ICD-10-CM

## 2018-02-07 ENCOUNTER — Encounter (HOSPITAL_BASED_OUTPATIENT_CLINIC_OR_DEPARTMENT_OTHER): Payer: Self-pay | Admitting: *Deleted

## 2018-02-07 ENCOUNTER — Other Ambulatory Visit: Payer: Self-pay

## 2018-02-13 ENCOUNTER — Ambulatory Visit (HOSPITAL_BASED_OUTPATIENT_CLINIC_OR_DEPARTMENT_OTHER)
Admission: RE | Admit: 2018-02-13 | Discharge: 2018-02-13 | Disposition: A | Discharge status: Home / Self Care | Payer: Medicare Other | Source: Ambulatory Visit | Attending: Otolaryngology | Admitting: Otolaryngology

## 2018-02-13 ENCOUNTER — Encounter (HOSPITAL_BASED_OUTPATIENT_CLINIC_OR_DEPARTMENT_OTHER)
Admission: RE | Disposition: A | Discharge status: Home / Self Care | Payer: Self-pay | Source: Ambulatory Visit | Attending: Otolaryngology

## 2018-02-13 ENCOUNTER — Other Ambulatory Visit: Payer: Self-pay

## 2018-02-13 ENCOUNTER — Ambulatory Visit (HOSPITAL_BASED_OUTPATIENT_CLINIC_OR_DEPARTMENT_OTHER): Payer: Medicare Other | Admitting: Anesthesiology

## 2018-02-13 DIAGNOSIS — J45909 Unspecified asthma, uncomplicated: Secondary | ICD-10-CM | POA: Diagnosis not present

## 2018-02-13 DIAGNOSIS — K219 Gastro-esophageal reflux disease without esophagitis: Secondary | ICD-10-CM | POA: Diagnosis not present

## 2018-02-13 DIAGNOSIS — M810 Age-related osteoporosis without current pathological fracture: Secondary | ICD-10-CM | POA: Diagnosis not present

## 2018-02-13 DIAGNOSIS — J3503 Chronic tonsillitis and adenoiditis: Secondary | ICD-10-CM

## 2018-02-13 DIAGNOSIS — J029 Acute pharyngitis, unspecified: Secondary | ICD-10-CM | POA: Insufficient documentation

## 2018-02-13 DIAGNOSIS — Z79899 Other long term (current) drug therapy: Secondary | ICD-10-CM | POA: Insufficient documentation

## 2018-02-13 DIAGNOSIS — F329 Major depressive disorder, single episode, unspecified: Secondary | ICD-10-CM | POA: Insufficient documentation

## 2018-02-13 DIAGNOSIS — J351 Hypertrophy of tonsils: Secondary | ICD-10-CM | POA: Insufficient documentation

## 2018-02-13 DIAGNOSIS — F419 Anxiety disorder, unspecified: Secondary | ICD-10-CM | POA: Insufficient documentation

## 2018-02-13 DIAGNOSIS — J3501 Chronic tonsillitis: Secondary | ICD-10-CM | POA: Diagnosis present

## 2018-02-13 DIAGNOSIS — J353 Hypertrophy of tonsils with hypertrophy of adenoids: Secondary | ICD-10-CM

## 2018-02-13 HISTORY — DX: Hypertrophy of tonsils: J35.1

## 2018-02-13 HISTORY — DX: Post-traumatic stress disorder, unspecified: F43.10

## 2018-02-13 HISTORY — PX: TONSILLECTOMY AND ADENOIDECTOMY: SHX28

## 2018-02-13 HISTORY — DX: Sleep apnea, unspecified: G47.30

## 2018-02-13 HISTORY — DX: Gastro-esophageal reflux disease without esophagitis: K21.9

## 2018-02-13 SURGERY — TONSILLECTOMY AND ADENOIDECTOMY
Anesthesia: General | Site: Throat

## 2018-02-13 MED ORDER — LACTATED RINGERS IV SOLN
INTRAVENOUS | Status: DC
  Administered 2018-02-13 (×2): via INTRAVENOUS

## 2018-02-13 MED ORDER — FENTANYL CITRATE (PF) 100 MCG/2ML IJ SOLN
INTRAMUSCULAR | Status: AC
  Filled 2018-02-13: qty 2

## 2018-02-13 MED ORDER — DEXAMETHASONE SODIUM PHOSPHATE 4 MG/ML IJ SOLN
INTRAMUSCULAR | Status: DC | PRN
  Administered 2018-02-13: 10 mg via INTRAVENOUS

## 2018-02-13 MED ORDER — ROCURONIUM BROMIDE 10 MG/ML (PF) SYRINGE
PREFILLED_SYRINGE | INTRAVENOUS | Status: AC
  Filled 2018-02-13: qty 10

## 2018-02-13 MED ORDER — FENTANYL CITRATE (PF) 100 MCG/2ML IJ SOLN
50.0000 ug | INTRAMUSCULAR | Status: DC | PRN
  Administered 2018-02-13: 50 ug via INTRAVENOUS
  Administered 2018-02-13: 100 ug via INTRAVENOUS

## 2018-02-13 MED ORDER — ONDANSETRON HCL 4 MG/2ML IJ SOLN
INTRAMUSCULAR | Status: AC
  Filled 2018-02-13: qty 2

## 2018-02-13 MED ORDER — OXYCODONE HCL 5 MG PO TABS: 5.0000 mg | ORAL_TABLET | Freq: Once | ORAL | Status: DC | PRN

## 2018-02-13 MED ORDER — SODIUM CHLORIDE 0.9 % IR SOLN
Status: DC | PRN
  Administered 2018-02-13: 1

## 2018-02-13 MED ORDER — ONDANSETRON HCL 4 MG/2ML IJ SOLN
INTRAMUSCULAR | Status: DC | PRN
  Administered 2018-02-13: 4 mg via INTRAVENOUS

## 2018-02-13 MED ORDER — ONDANSETRON HCL 4 MG/2ML IJ SOLN: 4.0000 mg | Freq: Four times a day (QID) | INTRAMUSCULAR | Status: DC | PRN

## 2018-02-13 MED ORDER — OXYCODONE HCL 5 MG/5ML PO SOLN: 5.0000 mg | Freq: Once | ORAL | Status: DC | PRN

## 2018-02-13 MED ORDER — DEXAMETHASONE SODIUM PHOSPHATE 10 MG/ML IJ SOLN
INTRAMUSCULAR | Status: AC
  Filled 2018-02-13: qty 1

## 2018-02-13 MED ORDER — SUGAMMADEX SODIUM 200 MG/2ML IV SOLN
INTRAVENOUS | Status: AC
  Filled 2018-02-13: qty 2

## 2018-02-13 MED ORDER — FENTANYL CITRATE (PF) 100 MCG/2ML IJ SOLN
25.0000 ug | INTRAMUSCULAR | Status: DC | PRN
  Administered 2018-02-13: 50 ug via INTRAVENOUS

## 2018-02-13 MED ORDER — PROPOFOL 10 MG/ML IV BOLUS
INTRAVENOUS | Status: AC
  Filled 2018-02-13: qty 40

## 2018-02-13 MED ORDER — SUCCINYLCHOLINE CHLORIDE 20 MG/ML IJ SOLN
INTRAMUSCULAR | Status: DC | PRN
  Administered 2018-02-13: 100 mg via INTRAVENOUS

## 2018-02-13 MED ORDER — MIDAZOLAM HCL 2 MG/2ML IJ SOLN: 1.0000 mg | INTRAMUSCULAR | Status: DC | PRN

## 2018-02-13 MED ORDER — AZITHROMYCIN 200 MG/5ML PO SUSR: 500.0000 mg | Freq: Every day | ORAL | 0 refills | Status: AC

## 2018-02-13 MED ORDER — SCOPOLAMINE 1 MG/3DAYS TD PT72: 1.0000 | MEDICATED_PATCH | Freq: Once | TRANSDERMAL | Status: DC | PRN

## 2018-02-13 MED ORDER — LIDOCAINE HCL (CARDIAC) PF 100 MG/5ML IV SOSY
PREFILLED_SYRINGE | INTRAVENOUS | Status: DC | PRN
  Administered 2018-02-13: 100 mg via INTRAVENOUS

## 2018-02-13 MED ORDER — LIDOCAINE HCL (CARDIAC) PF 100 MG/5ML IV SOSY
PREFILLED_SYRINGE | INTRAVENOUS | Status: AC
  Filled 2018-02-13: qty 5

## 2018-02-13 MED ORDER — OXYCODONE HCL 5 MG/5ML PO SOLN: 5.0000 mg | ORAL | 0 refills | Status: DC | PRN

## 2018-02-13 MED ORDER — PROPOFOL 10 MG/ML IV BOLUS
INTRAVENOUS | Status: DC | PRN
  Administered 2018-02-13: 200 mg via INTRAVENOUS

## 2018-02-13 SURGICAL SUPPLY — 34 items
BANDAGE COBAN STERILE 2 (GAUZE/BANDAGES/DRESSINGS) IMPLANT
CANISTER SUCT 1200ML W/VALVE (MISCELLANEOUS) ×4 IMPLANT
CATH ROBINSON RED A/P 10FR (CATHETERS) IMPLANT
CATH ROBINSON RED A/P 14FR (CATHETERS) ×3 IMPLANT
COAGULATOR SUCT 6 FR SWTCH (ELECTROSURGICAL)
COAGULATOR SUCT SWTCH 10FR 6 (ELECTROSURGICAL) IMPLANT
COVER BACK TABLE 60X90IN (DRAPES) ×4 IMPLANT
COVER MAYO STAND STRL (DRAPES) ×4 IMPLANT
ELECT REM PT RETURN 9FT ADLT (ELECTROSURGICAL) ×4
ELECT REM PT RETURN 9FT PED (ELECTROSURGICAL)
ELECTRODE REM PT RETRN 9FT PED (ELECTROSURGICAL) IMPLANT
ELECTRODE REM PT RTRN 9FT ADLT (ELECTROSURGICAL) ×1 IMPLANT
GAUZE SPONGE 4X4 12PLY STRL LF (GAUZE/BANDAGES/DRESSINGS) ×4 IMPLANT
GLOVE BIO SURGEON STRL SZ7.5 (GLOVE) ×4 IMPLANT
GLOVE BIOGEL PI IND STRL 7.0 (GLOVE) ×1 IMPLANT
GLOVE BIOGEL PI INDICATOR 7.0 (GLOVE) ×2
GLOVE ECLIPSE 6.5 STRL STRAW (GLOVE) ×3 IMPLANT
GOWN STRL REUS W/ TWL LRG LVL3 (GOWN DISPOSABLE) ×4 IMPLANT
GOWN STRL REUS W/TWL LRG LVL3 (GOWN DISPOSABLE) ×8
IV NS 500ML (IV SOLUTION) ×4
IV NS 500ML BAXH (IV SOLUTION) ×2 IMPLANT
MARKER SKIN DUAL TIP RULER LAB (MISCELLANEOUS) IMPLANT
NS IRRIG 1000ML POUR BTL (IV SOLUTION) ×3 IMPLANT
SHEET MEDIUM DRAPE 40X70 STRL (DRAPES) ×4 IMPLANT
SOLUTION BUTLER CLEAR DIP (MISCELLANEOUS) ×4 IMPLANT
SPONGE TONSIL TAPE 1 RFD (DISPOSABLE) IMPLANT
SPONGE TONSIL TAPE 1.25 RFD (DISPOSABLE) IMPLANT
SYR BULB 3OZ (MISCELLANEOUS) IMPLANT
TOWEL GREEN STERILE FF (TOWEL DISPOSABLE) ×4 IMPLANT
TUBE CONNECTING 20'X1/4 (TUBING) ×1
TUBE CONNECTING 20X1/4 (TUBING) ×3 IMPLANT
TUBE SALEM SUMP 12R W/ARV (TUBING) IMPLANT
TUBE SALEM SUMP 16 FR W/ARV (TUBING) ×3 IMPLANT
WAND COBLATOR 70 EVAC XTRA (SURGICAL WAND) IMPLANT

## 2018-02-13 NOTE — Anesthesia Preprocedure Evaluation (Signed)
Anesthesia Evaluation  Patient identified by MRN, date of birth, ID band Patient awake    Reviewed: Allergy & Precautions, H&P , NPO status , Patient's Chart, lab work & pertinent test results  Airway Mallampati: II   Neck ROM: full    Dental   Pulmonary sleep apnea ,    breath sounds clear to auscultation       Cardiovascular negative cardio ROS   Rhythm:regular Rate:Normal     Neuro/Psych  Headaches, PSYCHIATRIC DISORDERS Anxiety Depression Bipolar Disorder    GI/Hepatic GERD  ,  Endo/Other  obese  Renal/GU      Musculoskeletal   Abdominal   Peds  Hematology   Anesthesia Other Findings   Reproductive/Obstetrics                             Anesthesia Physical Anesthesia Plan  ASA: II  Anesthesia Plan: General   Post-op Pain Management:    Induction: Intravenous  PONV Risk Score and Plan: 3 and Ondansetron, Dexamethasone, Midazolam and Treatment may vary due to age or medical condition  Airway Management Planned: Oral ETT  Additional Equipment:   Intra-op Plan:   Post-operative Plan: Extubation in OR  Informed Consent: I have reviewed the patients History and Physical, chart, labs and discussed the procedure including the risks, benefits and alternatives for the proposed anesthesia with the patient or authorized representative who has indicated his/her understanding and acceptance.     Plan Discussed with: CRNA, Anesthesiologist and Surgeon  Anesthesia Plan Comments:         Anesthesia Quick Evaluation

## 2018-02-13 NOTE — Transfer of Care (Signed)
Immediate Anesthesia Transfer of Care Note  Patient: [name]  Procedure(s) Performed: TONSILLECTOMY AND ADENOIDECTOMY (N/A Throat)  Patient Location: PACU  Anesthesia Type:General  Level of Consciousness: awake, alert  and oriented  Airway & Oxygen Therapy: Patient Spontanous Breathing and Patient connected to face mask oxygen  Post-op Assessment: Report given to RN and Post -op Vital signs reviewed and stable  Post vital signs: Reviewed and stable  Last Vitals:  Vitals Value Taken Time  BP    Temp    Pulse 90 02/13/2018  8:34 AM  Resp 20 02/13/2018  8:34 AM  SpO2 99 % 02/13/2018  8:34 AM  Vitals shown include unvalidated device data.  Last Pain:  Vitals:   02/13/18 0646  TempSrc: Oral         Complications: No apparent anesthesia complications

## 2018-02-13 NOTE — Op Note (Signed)
DATE OF PROCEDURE:  02/13/2018                              OPERATIVE REPORT  SURGEON:  Leta Baptist, MD  PREOPERATIVE DIAGNOSES: 1. Tonsillar hypertrophy. 2. Chronic tonsillitis and pharyngitis  POSTOPERATIVE DIAGNOSES: 1. Adenotonsillar hypertrophy. 2. Chronic tonsillitis and pharyngitis  PROCEDURE PERFORMED:  Adenotonsillectomy.  ANESTHESIA:  General endotracheal tube anesthesia.  COMPLICATIONS:  None.  ESTIMATED BLOOD LOSS:  Minimal.  INDICATION FOR PROCEDURE:  [name] is a 47 y.o. female with a history of chronic tonsillitis/pharyngitis and halitosis.  According to the patient, she has been experiencing chronic throat discomfort with halitosis for several years. The patient continues to be symptomatic despite medical treatments. On examination, the patient was noted to have bilateral cryptic tonsils, with numerous tonsilloliths. Based on the above findings, the decision was made for the patient to undergo the adenotonsillectomy procedure. Likelihood of success in reducing symptoms was also discussed.  The risks, benefits, alternatives, and details of the procedure were discussed with the patient.  Questions were invited and answered.  Informed consent was obtained.  DESCRIPTION:  The patient was taken to the operating room and placed supine on the operating table.  General endotracheal tube anesthesia was administered by the anesthesiologist.  The patient was positioned and prepped and draped in a standard fashion for adenotonsillectomy.  A Crowe-Davis mouth gag was inserted into the oral cavity for exposure. 2+ cryptic tonsils were noted bilaterally.  No bifidity was noted.  Indirect mirror examination of the nasopharynx revealed mild adenoid hypertrophy. The adenoid was ablated with the Coblator device. Hemostasis was achieved with the Coblator device.  The right tonsil was then grasped with a straight Allis clamp and retracted medially.  It was resected free from the underlying  pharyngeal constrictor muscles with the Coblator device.  The same procedure was repeated on the left side without exception.  The surgical sites were copiously irrigated.  The mouth gag was removed.  The care of the patient was turned over to the anesthesiologist.  The patient was awakened from anesthesia without difficulty.  The patient was extubated and transferred to the recovery room in good condition.  OPERATIVE FINDINGS:  Adenotonsillar hypertrophy.  SPECIMEN:  Bilateral tonsils  FOLLOWUP CARE:  The patient will be discharged home once awake and alert.  She will be placed on azithromycin for 3 days, and oxycodone 5-63ml po q 4 hours for postop pain control.   The patient will follow up in my office in approximately 2 weeks.  Ronie Fleeger W Artemis Loyal 02/13/2018 8:37 AM

## 2018-02-13 NOTE — Discharge Instructions (Addendum)
[facility] Postoperative Instructions for Tonsillectomy & Adenoidectomy (T&A) Activity Restrict activity at home for the first two days, resting as much as possible. Light indoor activity is best. You may usually return to school or work within a week but void strenuous activity and sports for two weeks. Sleep with your head elevated on 2-3 pillows for 3-4 days to help decrease swelling. Diet Due to tissue swelling and throat discomfort, you may have little desire to drink for several days. However fluids are very important to prevent dehydration. You will find that non-acidic juices, soups, popsicles, Jell-O, custard, puddings, and any soft or mashed foods taken in small quantities can be swallowed fairly easily. Try to increase your fluid and food intake as the discomfort subsides. It is recommended that a child receive 1-1/2 quarts of fluid in a 24-hour period. Adult require twice this amount.  Discomfort Your sore throat may be relieved by applying an ice collar to your neck and/or by taking Tylenol. You may experience an earache, which is due to referred pain from the throat. Referred ear pain is commonly felt at night when trying to rest.  Bleeding                        Although rare, there is risk of having some bleeding during the first 2 weeks after having a T&A. This usually happens between days 7-10 postoperatively. If you or your child should have any bleeding, try to remain calm. We recommend sitting up quietly in a chair and gently spitting out the blood into a bowl. For adults, gargling gently with ice water may help. If the bleeding does not stop after a short time (5 minutes), is more than 1 teaspoonful, or if you become worried, please call our office at [phone] or go directly to the nearest hospital emergency room. Do not eat or drink anything prior to going to the hospital as you may need to be taken to the operating room in order to control the bleeding. GENERAL  CONSIDERATIONS 1. Brush your teeth regularly. Avoid mouthwashes and gargles for three weeks. You may gargle gently with warm salt-water as necessary or spray with Chloraseptic. You may make salt-water by placing 2 teaspoons of table salt into a quart of fresh water. Warm the salt-water in a microwave to a luke warm temperature.  2. Avoid exposure to colds and upper respiratory infections if possible.  3. If you look into a mirror or into your child's mouth, you will see white-gray patches in the back of the throat. This is normal after having a T&A and is like a scab that forms on the skin after an abrasion. It will disappear once the back of the throat heals completely. However, it may cause a noticeable odor; this too will disappear with time. Again, warm salt-water gargles may be used to help keep the throat clean and promote healing.  4. You may notice a temporary change in voice quality, such as a higher pitched voice or a nasal sound, until healing is complete. This may last for 1-2 weeks and should resolve.  5. Do not take or give you child any medications that we have not prescribed or recommended.  6. Snoring may occur, especially at night, for the first week after a T&A. It is due to swelling of the soft palate and will usually resolve.  Please call our office at [phone] if you have any questions.       Post Anesthesia Home Care Instructions  Activity: Get plenty of rest for the remainder of the day. A responsible individual must stay with you for 24 hours following the procedure.  For the next 24 hours, DO NOT: -Drive a car -Operate machinery -Drink alcoholic beverages -Take any medication unless instructed by your physician -Make any legal decisions or sign important papers.  Meals: Start with liquid foods such as gelatin or soup. Progress to regular foods as tolerated. Avoid greasy, spicy, heavy foods. If nausea and/or vomiting occur, drink only clear liquids until the nausea  and/or vomiting subsides. Call your physician if vomiting continues.  Special Instructions/Symptoms: Your throat may feel dry or sore from the anesthesia or the breathing tube placed in your throat during surgery. If this causes discomfort, gargle with warm salt water. The discomfort should disappear within 24 hours.  If you had a scopolamine patch placed behind your ear for the management of post- operative nausea and/or vomiting:  1. The medication in the patch is effective for 72 hours, after which it should be removed.  Wrap patch in a tissue and discard in the trash. Wash hands thoroughly with soap and water. 2. You may remove the patch earlier than 72 hours if you experience unpleasant side effects which may include dry mouth, dizziness or visual disturbances. 3. Avoid touching the patch. Wash your hands with soap and water after contact with the patch.     

## 2018-02-13 NOTE — H&P (Signed)
[id number] tonsillitis, tonsil stones  HPI: The patient is a 47 y/o female who presents today with complaints of recurrent tonsillitis, tonsil stones, and hoarseness. The patient is seen in consultation requested by [facility]. The patient has noted issues with her tonsils for most of her life. She continues to have frequent sore throat as an adult with multiple tonsil stones. The stones cause halitosis and throat pain. The patient also complains of hoarseness for the past several years. She has reflux symptoms and recently started taking omeprazole 20 mg BID. She clears her throat often with occasional cough. The patient has no history of tobacco use. Previous ENT surgery is denied.   The patient's review of systems (constitutional, eyes, ENT, cardiovascular, respiratory, GI, musculoskeletal, skin, neurologic, psychiatric, endocrine, hematologic, allergic) is noted in the ROS questionnaire.  It is reviewed with the patient.   Family health history: Diabetes, heart disease, hearing loss, problems with anesthesia.   Major events: Endometrial ablation, tubal ligation.   Ongoing medical problems: Asthma, reflux, ulcers, nausea, night sweats, arthritis, osteoporosis, headache, migraine, depression, anxiety, anemia, allergies, autoimmune disorder.   Social history: The patient is single.  She denies the use of tobacco, alcohol, or illegal drugs.  Exam: General: Communicates without difficulty, well nourished, no acute distress. Head: Normocephalic, no evidence injury, no tenderness, facial buttresses intact without stepoff. Eyes: PERRL, EOMI.  No scleral icterus, conjunctivae clear. Ears: External auditory canals clear bilaterally.  There is no edema or erythema.  Tympanic membrane is within normal limits bilaterally. Nose: Normal skin and external support.  Anterior rhinoscopy reveals healthy pink mucosa over the septum and turbinates.  No lesions or polyps were seen. Oral cavity: Lips  without lesions, oral mucosa moist, no masses or lesions seen. Tonsils 1+, cryptic. Indirect  mirror laryngoscopy could not be tolerated. Pharynx: Clear, no erythema. Neck: Supple, full range of motion, no lymphadenopathy, no masses palpable. Salivary: Parotid and submandibular glands without mass. Neuro:  CN 2-12 grossly intact. Gait normal. Vestibular: No nystagmus at any point of gaze.   Procedure:  Flexible Fiberoptic Laryngoscopy -- Risks, benefits, and alternatives of flexible endoscopy were explained to the patient.  Specific mention was made of the risk of throat numbness with difficulty swallowing, possible bleeding from the nose and mouth, and pain from the procedure.  The patient gave oral consent to proceed.  The nasal cavities were decongested and anesthetised with a combination of oxymetazoline and 4% lidocaine solution.  The flexible scope was inserted into the right nasal cavity and advanced towards the nasopharynx.  Visualized mucosa over the turbinates and septum were normal.  The nasopharynx was clear.  Oropharyngeal walls were symmetric and mobile without lesion, mass, or edema.  Hypopharynx was also without  lesion or edema.  Larynx was mobile without lesions.  No lesions or asymmetry in the supraglottic larynx.  Arytenoid mucosa was edematous with slight erythema.  True vocal folds were pale yellow and edematous but without mass or lesion.  Base of tongue was within normal limits. The patient tolerated the procedure well.   Assessment  1. The patient's hoarseness is likely a result of her laryngopharyngeal reflux.  Moderate posterior laryngeal edema is noted today.  No other suspicious mass or lesion is noted on today's fiberoptic laryngoscopy exam. 2. History of recurrent tonsillitis with frequent tonsilloliths and associated halitosis.   Plan 1. The physical exam and laryngoscopy findings are reviewed with the patient.  2. Information on laryngopharyngeal reflux is discussed with  the patient. Behavior  modifications that could improve the symptoms are also discussed..  3. Continue with omeprazole bid 4. The treatment options include continuing conservative observation versus tonsillectomy.   The risks, benefits, alternatives, and details of the procedure are reviewed with the patient.  Questions are invited and answered.  5. The patient is interested in proceeding with the procedure.  We will schedule the procedure in accordance with the family schedule.

## 2018-02-13 NOTE — Anesthesia Procedure Notes (Signed)
Procedure Name: Intubation Performed by: Verita Lamb, CRNA Pre-anesthesia Checklist: Patient identified, Emergency Drugs available, Suction available, Patient being monitored and Timeout performed Patient Re-evaluated:Patient Re-evaluated prior to induction Oxygen Delivery Method: Circle system utilized Preoxygenation: Pre-oxygenation with 100% oxygen Induction Type: IV induction and Rapid sequence Ventilation: Mask ventilation without difficulty Laryngoscope Size: Mac and 3 Grade View: Grade I Tube type: Oral Tube size: 6.5 mm Number of attempts: 1 Airway Equipment and Method: Stylet Placement Confirmation: ETT inserted through vocal cords under direct vision,  positive ETCO2,  CO2 detector and breath sounds checked- equal and bilateral Secured at: 22 cm Tube secured with: Tape Dental Injury: Teeth and Oropharynx as per pre-operative assessment

## 2018-02-13 NOTE — Anesthesia Postprocedure Evaluation (Signed)
Anesthesia Post Note  Patient: [name]  Procedure(s) Performed: TONSILLECTOMY AND ADENOIDECTOMY (N/A Throat)     Patient location during evaluation: PACU Anesthesia Type: General Level of consciousness: awake and alert Pain management: pain level controlled Vital Signs Assessment: post-procedure vital signs reviewed and stable Respiratory status: spontaneous breathing, nonlabored ventilation, respiratory function stable and patient connected to nasal cannula oxygen Cardiovascular status: blood pressure returned to baseline and stable Postop Assessment: no apparent nausea or vomiting Anesthetic complications: no    Last Vitals:  Vitals:   02/13/18 1000 02/13/18 1030  BP: 120/87 130/90  Pulse: 69 63  Resp: 14 16  Temp:  (!) 36.3 C  SpO2: 99% 99%    Last Pain:  Vitals:   02/13/18 1030  TempSrc:   PainSc: 5                  Devera Englander S

## 2018-02-14 ENCOUNTER — Encounter (HOSPITAL_BASED_OUTPATIENT_CLINIC_OR_DEPARTMENT_OTHER): Payer: Self-pay | Admitting: Otolaryngology

## 2018-02-26 ENCOUNTER — Ambulatory Visit (INDEPENDENT_AMBULATORY_CARE_PROVIDER_SITE_OTHER): Payer: Medicare Other | Admitting: Otolaryngology

## 2018-03-16 ENCOUNTER — Ambulatory Visit (HOSPITAL_COMMUNITY): Payer: Self-pay

## 2018-03-21 ENCOUNTER — Other Ambulatory Visit (HOSPITAL_COMMUNITY): Payer: Self-pay | Admitting: Internal Medicine

## 2018-03-21 DIAGNOSIS — Z1231 Encounter for screening mammogram for malignant neoplasm of breast: Secondary | ICD-10-CM

## 2018-03-28 ENCOUNTER — Ambulatory Visit (HOSPITAL_COMMUNITY): Payer: Self-pay

## 2018-04-06 ENCOUNTER — Ambulatory Visit (HOSPITAL_COMMUNITY)
Admission: RE | Admit: 2018-04-06 | Discharge: 2018-04-06 | Disposition: A | Discharge status: Home / Self Care | Payer: Medicare Other | Source: Ambulatory Visit | Attending: Internal Medicine | Admitting: Internal Medicine

## 2018-04-06 DIAGNOSIS — Z1231 Encounter for screening mammogram for malignant neoplasm of breast: Secondary | ICD-10-CM | POA: Insufficient documentation

## 2019-03-29 ENCOUNTER — Other Ambulatory Visit: Payer: Self-pay | Admitting: Obstetrics and Gynecology

## 2019-03-29 ENCOUNTER — Encounter: Payer: Self-pay | Admitting: Obstetrics and Gynecology

## 2019-03-29 ENCOUNTER — Ambulatory Visit (INDEPENDENT_AMBULATORY_CARE_PROVIDER_SITE_OTHER): Payer: Medicare Other | Admitting: Obstetrics and Gynecology

## 2019-03-29 ENCOUNTER — Other Ambulatory Visit: Payer: Self-pay

## 2019-03-29 VITALS — BP 121/75 | HR 72 | Ht 67.5 in | Wt 236.2 lb

## 2019-03-29 DIAGNOSIS — N939 Abnormal uterine and vaginal bleeding, unspecified: Secondary | ICD-10-CM

## 2019-03-29 NOTE — Progress Notes (Signed)
Patient ID: [name], female   DOB: [birth year], 48 y.o.   MRN: [id number]   [facility] Visit  @DATE @            Patient name: [name] MRN [id number]  Date of birth: 09/15/1970  CC & HPI:  [name] is a 48 y.o. female presenting today for vaginal bleeding w/ cramping and clots. Had light spotting 2 years ago after ablation. Gets cramps if she is around her daughter or other women who are on their cycle. For the past 2 months has had some bleeding, either light pink or old dark blod is starting to feel frustrated and talked to therapist about it and was told to f/u with GYN. Tried megace but was scared about side effects, since having breast issues. No fhx of breast cancer but has personal abnormalities with mammo testing.  ROS:  ROS +vaginal clots +cramping +abnormal mammo testing -fever All systems are negative except as noted in the HPI and PMH.   Pertinent History Reviewed:   Reviewed: Significant for  Medical         Past Medical History:  Diagnosis Date  . Anemia   . Anxiety   . Arthritis    orthopedic  . Bipolar disorder ([place])   . Depression   . Fibromyalgia   . GERD (gastroesophageal reflux disease)   . Hypotension   . IBS (irritable bowel syndrome)   . Migraine   . PTSD (post-traumatic stress disorder)    on prazosin  . Sleep apnea   . Tonsillar hypertrophy   . Uterine fibroid                               Surgical Hx:    Past Surgical History:  Procedure Laterality Date  . ENDOMETRIAL ABLATION    . TONSILLECTOMY AND ADENOIDECTOMY N/A 02/13/2018   Procedure: TONSILLECTOMY AND ADENOIDECTOMY;  Surgeon: Leta Baptist, MD;  Location: [facility];  Service: ENT;  Laterality: N/A;  . TUBAL LIGATION     Medications: Reviewed & Updated - see associated section                       Current Outpatient Medications:  .  alprazolam (XANAX) 2 MG tablet, Take 1 tablet (2 mg total) by mouth 3 (three) times daily as needed.,  Disp: 90 tablet, Rfl: 2 .  Ascorbic Acid (VITAMIN C PO), Take by mouth daily., Disp: , Rfl:  .  gabapentin (NEURONTIN) 300 MG capsule, Take 600 mg by mouth at bedtime. , Disp: , Rfl:  .  hydrOXYzine (ATARAX/VISTARIL) 50 MG tablet, Take 50 mg by mouth 2 (two) times daily., Disp: , Rfl:  .  Multiple Vitamins-Minerals (ZINC PO), Take by mouth daily., Disp: , Rfl:  .  omeprazole (PRILOSEC) 20 MG capsule, Take 20 mg by mouth 2 (two) times daily., Disp: , Rfl:  .  Oxcarbazepine (TRILEPTAL) 300 MG tablet, Take 1 tablet (300 mg total) by mouth 2 (two) times daily. (Patient taking differently: Take 600 mg by mouth 2 (two) times daily. ), Disp: 60 tablet, Rfl: 2 .  PREGABALIN PO, Take by mouth every morning., Disp: , Rfl:  .  spironolactone (ALDACTONE) 25 MG tablet, Take 25 mg by mouth as needed. , Disp: , Rfl:    Social History: Reviewed -  reports that she has never smoked. She has never used smokeless tobacco.  Objective Findings:  Vitals: Blood pressure 121/75, pulse 72, height 5' 7.5" (1.715 m), weight 236 lb 3.2 oz (107.1 kg).  PHYSICAL EXAMINATION General appearance - alert, well appearing, and in no distress Mental status - alert, oriented to person, place, and time, normal mood, behavior, speech, dress, motor activity, and thought processes, affect appropriate to mood  PELVIC Patient given informed consent, signed copy in the chart, time out was performed. Appropriate time out taken. . The patient was placed in the lithotomy position and the cervix brought into view with sterile speculum.  Portio of cervix cleansed x 2 with betadine swabs.  A tenaculum was placed in the anterior lip of the cervix.  The uterus was sounded for depth of 6 cm. A pipelle was introduced to into the uterus, suction created,  and scanty endometrial sample was obtained. All equipment was removed and accounted for.  The patient tolerated the procedure well.   Patient given post procedure instructions. The patient will  return in 2 weeks for results.  Assessment & Plan:   A:  1.  AUB status post endometrial ablation 2. Endo bx done  P:  1. Call with results    By signing my name below, I, Samul Dada, attest that this documentation has been prepared under the direction and in the presence of [name], MD. Electronically Signed: [place]. 03/29/19. 11:34 AM.  I personally performed the services described in this documentation, which was SCRIBED in my presence. The recorded information has been reviewed and considered accurate. It has been edited as necessary during review. [name], MD

## 2019-04-03 ENCOUNTER — Telehealth: Payer: Self-pay | Admitting: *Deleted

## 2019-04-03 NOTE — Telephone Encounter (Signed)
Called patient with biopsy results.  MB full. Unable to leave message.

## 2019-04-04 ENCOUNTER — Telehealth: Payer: Self-pay | Admitting: *Deleted

## 2019-04-04 NOTE — Telephone Encounter (Signed)
Patient informed biopsy was benign.  Verbalized gratitude but is still concerned and wants to know why she is still cramping and bleeding every 2 weeks.  Wants to know what she needs to do now. Please advise.

## 2019-04-05 ENCOUNTER — Ambulatory Visit: Payer: Medicare Other | Admitting: Obstetrics and Gynecology

## 2019-04-22 ENCOUNTER — Telehealth: Payer: Self-pay | Admitting: *Deleted

## 2019-04-22 NOTE — Telephone Encounter (Signed)
Pt continues to have cramping and spotting. Advised she needs an appt. Pt voiced understanding and call was  transferred to front desk for appt. [name]

## 2019-04-22 NOTE — Telephone Encounter (Signed)
Patient states she had a biopsy done with Dr Glo Herring that was negative however, she is still experiencing cramping and spotting and would like to discuss any next steps.

## 2019-05-03 ENCOUNTER — Telehealth: Payer: Self-pay | Admitting: Obstetrics and Gynecology

## 2019-05-03 NOTE — Telephone Encounter (Signed)

## 2019-05-03 NOTE — Telephone Encounter (Signed)
I suspect it's time to suppress with Megace 40 mg daily and see if this bleeding can be controlled. I will send in a Rx if pt is interested.

## 2019-05-06 ENCOUNTER — Ambulatory Visit (INDEPENDENT_AMBULATORY_CARE_PROVIDER_SITE_OTHER): Payer: Medicare Other | Admitting: Obstetrics and Gynecology

## 2019-05-06 ENCOUNTER — Encounter: Payer: Self-pay | Admitting: Obstetrics and Gynecology

## 2019-05-06 ENCOUNTER — Other Ambulatory Visit: Payer: Self-pay

## 2019-05-06 VITALS — BP 114/76 | HR 70 | Ht 67.5 in | Wt 237.2 lb

## 2019-05-06 DIAGNOSIS — N9489 Other specified conditions associated with female genital organs and menstrual cycle: Secondary | ICD-10-CM | POA: Diagnosis not present

## 2019-05-06 MED ORDER — NORETHINDRONE 0.35 MG PO TABS: 1.0000 | ORAL_TABLET | Freq: Every day | ORAL | 11 refills | Status: DC

## 2019-05-06 NOTE — Progress Notes (Signed)
Patient ID: [name], female   DOB: 1971/04/24, 48 y.o.   MRN: [id number]    [facility] Visit  @DATE @            Patient name: [name] MRN [id number]  Date of birth: 06-06-1971  CC & HPI:  [name] is a 48 y.o. female presenting today for f/u for AUB s/p ablation. Now has to wear medical alert necklace due to falls. Doesn't want megace, is concerned anout side effects affecting her breast due to hx  abnormal benign mammograms. She has a mammo scheduled soon.   ROS:  ROS +AUB  +cramping -fever -nausea -vomiting  Pertinent History Reviewed:   Reviewed: Significant for seizures,  Medical         Past Medical History:  Diagnosis Date  . Anemia   . Anxiety   . Arthritis    orthopedic  . Bipolar disorder ([place])   . Depression   . Fibromyalgia   . GERD (gastroesophageal reflux disease)   . Hypotension   . IBS (irritable bowel syndrome)   . Migraine   . PTSD (post-traumatic stress disorder)    on prazosin  . Sleep apnea   . Tonsillar hypertrophy   . Uterine fibroid                               Surgical Hx:    Past Surgical History:  Procedure Laterality Date  . ENDOMETRIAL ABLATION    . TONSILLECTOMY AND ADENOIDECTOMY N/A 02/13/2018   Procedure: TONSILLECTOMY AND ADENOIDECTOMY;  Surgeon: Leta Baptist, MD;  Location: [facility];  Service: ENT;  Laterality: N/A;  . TUBAL LIGATION     Medications: Reviewed & Updated - see associated section                       Current Outpatient Medications:  .  alprazolam (XANAX) 2 MG tablet, Take 1 tablet (2 mg total) by mouth 3 (three) times daily as needed., Disp: 90 tablet, Rfl: 2 .  Ascorbic Acid (VITAMIN C PO), Take by mouth daily., Disp: , Rfl:  .  B Complex-C-E-Zn (B COMPLEX-C-E-ZINC) tablet, Take 1 tablet by mouth daily., Disp: , Rfl:  .  gabapentin (NEURONTIN) 300 MG capsule, Take 600 mg by mouth at bedtime. , Disp: , Rfl:  .  hydrOXYzine (ATARAX/VISTARIL) 50 MG tablet, Take  50 mg by mouth 2 (two) times daily., Disp: , Rfl:  .  omeprazole (PRILOSEC) 20 MG capsule, Take 20 mg by mouth 2 (two) times daily., Disp: , Rfl:  .  Oxcarbazepine (TRILEPTAL) 300 MG tablet, Take 1 tablet (300 mg total) by mouth 2 (two) times daily. (Patient taking differently: Take 600 mg by mouth 2 (two) times daily. ), Disp: 60 tablet, Rfl: 2 .  PREGABALIN PO, Take by mouth every morning., Disp: , Rfl:  .  spironolactone (ALDACTONE) 25 MG tablet, Take 25 mg by mouth as needed. , Disp: , Rfl:  .  vitamin B-12 (CYANOCOBALAMIN) 100 MCG tablet, Take 100 mcg by mouth daily., Disp: , Rfl:  .  Multiple Vitamins-Minerals (ZINC PO), Take by mouth daily., Disp: , Rfl:    Social History: Reviewed -  reports that she has never smoked. She has never used smokeless tobacco.  Objective Findings:  Vitals: Height 5' 7.5" (1.715 m), weight 237 lb 3.2 oz (107.6 kg), last menstrual period 05/01/2019.  PHYSICAL EXAMINATION General  appearance - alert, well appearing, and in no distress Mental status - alert, oriented to person, place, and time, normal mood, behavior, speech, dress, motor activity, and thought processes, affect appropriate to mood  PELVIC Deferred  Assessment & Plan:   A:  1. AUB 2. Abdominal pain 3. Hx of abnormal mammo testing 4.   P:  Rx Northendrone )  1. T mg daily 2. Bleeding on northendrone quit for 5 days the re-continue medication    By signing my name below, I, Samul Dada, attest that this documentation has been prepared under the direction and in the presence of [name], MD. Electronically Signed: [place]. 05/06/19. 4:14 PM.  I personally performed the services described in this documentation, which was SCRIBED in my presence. The recorded information has been reviewed and considered accurate. It has been edited as necessary during review. [name], MD

## 2019-06-04 ENCOUNTER — Other Ambulatory Visit (HOSPITAL_COMMUNITY): Payer: Self-pay | Admitting: Internal Medicine

## 2019-06-04 DIAGNOSIS — N644 Mastodynia: Secondary | ICD-10-CM

## 2019-06-06 ENCOUNTER — Telehealth: Payer: Self-pay | Admitting: Obstetrics and Gynecology

## 2019-06-06 NOTE — Telephone Encounter (Signed)

## 2019-06-07 ENCOUNTER — Ambulatory Visit: Payer: Medicare Other | Admitting: Obstetrics and Gynecology

## 2019-07-18 ENCOUNTER — Telehealth: Payer: Self-pay | Admitting: *Deleted

## 2019-07-18 NOTE — Telephone Encounter (Signed)
Patient left message had visit with PCP and they recommended follow up with GYN. Patient is having bleeding issues and cramping. Patient would like to talk about ablation with Dr. Glo Herring.

## 2019-07-22 DIAGNOSIS — I959 Hypotension, unspecified: Secondary | ICD-10-CM | POA: Diagnosis not present

## 2019-07-22 DIAGNOSIS — Z880 Allergy status to penicillin: Secondary | ICD-10-CM | POA: Diagnosis not present

## 2019-07-22 DIAGNOSIS — R58 Hemorrhage, not elsewhere classified: Secondary | ICD-10-CM | POA: Diagnosis not present

## 2019-07-22 DIAGNOSIS — W268XXA Contact with other sharp object(s), not elsewhere classified, initial encounter: Secondary | ICD-10-CM | POA: Diagnosis not present

## 2019-07-22 DIAGNOSIS — I10 Essential (primary) hypertension: Secondary | ICD-10-CM | POA: Diagnosis not present

## 2019-07-22 DIAGNOSIS — S91311A Laceration without foreign body, right foot, initial encounter: Secondary | ICD-10-CM | POA: Diagnosis not present

## 2019-07-22 DIAGNOSIS — Z79899 Other long term (current) drug therapy: Secondary | ICD-10-CM | POA: Diagnosis not present

## 2019-07-22 DIAGNOSIS — W19XXXA Unspecified fall, initial encounter: Secondary | ICD-10-CM | POA: Diagnosis not present

## 2019-07-22 DIAGNOSIS — S81811A Laceration without foreign body, right lower leg, initial encounter: Secondary | ICD-10-CM | POA: Diagnosis not present

## 2019-07-22 DIAGNOSIS — R52 Pain, unspecified: Secondary | ICD-10-CM | POA: Diagnosis not present

## 2019-07-22 DIAGNOSIS — S81812A Laceration without foreign body, left lower leg, initial encounter: Secondary | ICD-10-CM | POA: Diagnosis not present

## 2019-07-24 DIAGNOSIS — S81811D Laceration without foreign body, right lower leg, subsequent encounter: Secondary | ICD-10-CM | POA: Diagnosis not present

## 2019-07-24 DIAGNOSIS — X58XXXD Exposure to other specified factors, subsequent encounter: Secondary | ICD-10-CM | POA: Diagnosis not present

## 2019-07-24 DIAGNOSIS — S81812D Laceration without foreign body, left lower leg, subsequent encounter: Secondary | ICD-10-CM | POA: Diagnosis not present

## 2019-08-02 DIAGNOSIS — M542 Cervicalgia: Secondary | ICD-10-CM | POA: Diagnosis not present

## 2019-08-02 DIAGNOSIS — S91019A Laceration without foreign body, unspecified ankle, initial encounter: Secondary | ICD-10-CM | POA: Diagnosis not present

## 2019-08-02 DIAGNOSIS — Z789 Other specified health status: Secondary | ICD-10-CM | POA: Diagnosis not present

## 2019-08-02 DIAGNOSIS — G4700 Insomnia, unspecified: Secondary | ICD-10-CM | POA: Diagnosis not present

## 2019-08-02 DIAGNOSIS — Z299 Encounter for prophylactic measures, unspecified: Secondary | ICD-10-CM | POA: Diagnosis not present

## 2019-08-06 DIAGNOSIS — Z299 Encounter for prophylactic measures, unspecified: Secondary | ICD-10-CM | POA: Diagnosis not present

## 2019-08-06 DIAGNOSIS — Z789 Other specified health status: Secondary | ICD-10-CM | POA: Diagnosis not present

## 2019-08-06 DIAGNOSIS — Z4889 Encounter for other specified surgical aftercare: Secondary | ICD-10-CM | POA: Diagnosis not present

## 2019-08-06 DIAGNOSIS — Z713 Dietary counseling and surveillance: Secondary | ICD-10-CM | POA: Diagnosis not present

## 2019-09-24 DIAGNOSIS — M791 Myalgia, unspecified site: Secondary | ICD-10-CM | POA: Diagnosis not present

## 2019-09-24 DIAGNOSIS — M542 Cervicalgia: Secondary | ICD-10-CM | POA: Diagnosis not present

## 2019-09-24 DIAGNOSIS — G43111 Migraine with aura, intractable, with status migrainosus: Secondary | ICD-10-CM | POA: Diagnosis not present

## 2019-09-24 DIAGNOSIS — G43719 Chronic migraine without aura, intractable, without status migrainosus: Secondary | ICD-10-CM | POA: Diagnosis not present

## 2019-10-02 ENCOUNTER — Telehealth: Payer: Self-pay | Admitting: Obstetrics and Gynecology

## 2019-10-02 NOTE — Telephone Encounter (Signed)

## 2019-10-03 ENCOUNTER — Ambulatory Visit: Payer: Medicare Other | Admitting: Obstetrics and Gynecology

## 2019-10-14 DIAGNOSIS — Z299 Encounter for prophylactic measures, unspecified: Secondary | ICD-10-CM | POA: Diagnosis not present

## 2019-10-14 DIAGNOSIS — R7309 Other abnormal glucose: Secondary | ICD-10-CM | POA: Diagnosis not present

## 2019-10-28 DIAGNOSIS — M797 Fibromyalgia: Secondary | ICD-10-CM | POA: Diagnosis not present

## 2019-10-28 DIAGNOSIS — Z299 Encounter for prophylactic measures, unspecified: Secondary | ICD-10-CM | POA: Diagnosis not present

## 2019-10-28 DIAGNOSIS — Z789 Other specified health status: Secondary | ICD-10-CM | POA: Diagnosis not present

## 2019-10-28 DIAGNOSIS — M7050 Other bursitis of knee, unspecified knee: Secondary | ICD-10-CM | POA: Diagnosis not present

## 2019-10-28 DIAGNOSIS — M62838 Other muscle spasm: Secondary | ICD-10-CM | POA: Diagnosis not present

## 2019-10-28 DIAGNOSIS — M25549 Pain in joints of unspecified hand: Secondary | ICD-10-CM | POA: Diagnosis not present

## 2019-10-28 DIAGNOSIS — E559 Vitamin D deficiency, unspecified: Secondary | ICD-10-CM | POA: Diagnosis not present

## 2019-11-11 DIAGNOSIS — M25561 Pain in right knee: Secondary | ICD-10-CM | POA: Diagnosis not present

## 2019-11-11 DIAGNOSIS — M791 Myalgia, unspecified site: Secondary | ICD-10-CM | POA: Diagnosis not present

## 2019-11-11 DIAGNOSIS — Z299 Encounter for prophylactic measures, unspecified: Secondary | ICD-10-CM | POA: Diagnosis not present

## 2019-11-11 DIAGNOSIS — M7050 Other bursitis of knee, unspecified knee: Secondary | ICD-10-CM | POA: Diagnosis not present

## 2019-11-11 DIAGNOSIS — Z789 Other specified health status: Secondary | ICD-10-CM | POA: Diagnosis not present

## 2019-11-22 DIAGNOSIS — M797 Fibromyalgia: Secondary | ICD-10-CM | POA: Diagnosis not present

## 2019-11-22 DIAGNOSIS — R5383 Other fatigue: Secondary | ICD-10-CM | POA: Diagnosis not present

## 2019-11-22 DIAGNOSIS — Z299 Encounter for prophylactic measures, unspecified: Secondary | ICD-10-CM | POA: Diagnosis not present

## 2019-11-22 DIAGNOSIS — Z789 Other specified health status: Secondary | ICD-10-CM | POA: Diagnosis not present

## 2019-11-22 DIAGNOSIS — M25551 Pain in right hip: Secondary | ICD-10-CM | POA: Diagnosis not present

## 2019-11-22 DIAGNOSIS — G4700 Insomnia, unspecified: Secondary | ICD-10-CM | POA: Diagnosis not present

## 2019-11-22 DIAGNOSIS — M461 Sacroiliitis, not elsewhere classified: Secondary | ICD-10-CM | POA: Diagnosis not present

## 2019-11-30 ENCOUNTER — Emergency Department (HOSPITAL_COMMUNITY): Payer: Medicare Other

## 2019-11-30 ENCOUNTER — Encounter (HOSPITAL_COMMUNITY): Payer: Self-pay

## 2019-11-30 ENCOUNTER — Emergency Department (HOSPITAL_COMMUNITY)
Admission: EM | Admit: 2019-11-30 | Discharge: 2019-11-30 | Disposition: A | Discharge status: Home / Self Care | Payer: Medicare Other | Attending: Emergency Medicine | Admitting: Emergency Medicine

## 2019-11-30 ENCOUNTER — Other Ambulatory Visit: Payer: Self-pay

## 2019-11-30 DIAGNOSIS — K449 Diaphragmatic hernia without obstruction or gangrene: Secondary | ICD-10-CM | POA: Insufficient documentation

## 2019-11-30 DIAGNOSIS — R1030 Lower abdominal pain, unspecified: Secondary | ICD-10-CM | POA: Diagnosis not present

## 2019-11-30 DIAGNOSIS — Z79899 Other long term (current) drug therapy: Secondary | ICD-10-CM | POA: Insufficient documentation

## 2019-11-30 DIAGNOSIS — N83209 Unspecified ovarian cyst, unspecified side: Secondary | ICD-10-CM | POA: Diagnosis not present

## 2019-11-30 DIAGNOSIS — K8020 Calculus of gallbladder without cholecystitis without obstruction: Secondary | ICD-10-CM | POA: Diagnosis not present

## 2019-11-30 DIAGNOSIS — R52 Pain, unspecified: Secondary | ICD-10-CM

## 2019-11-30 DIAGNOSIS — R1031 Right lower quadrant pain: Secondary | ICD-10-CM | POA: Diagnosis not present

## 2019-11-30 LAB — URINALYSIS, ROUTINE W REFLEX MICROSCOPIC
Bilirubin Urine: NEGATIVE
Glucose, UA: 150 mg/dL — AB
Hgb urine dipstick: NEGATIVE
Ketones, ur: NEGATIVE mg/dL
Leukocytes,Ua: NEGATIVE
Nitrite: NEGATIVE
Protein, ur: NEGATIVE mg/dL
Specific Gravity, Urine: 1.03 (ref 1.005–1.030)
pH: 5 (ref 5.0–8.0)

## 2019-11-30 LAB — LIPASE, BLOOD: Lipase: 16 U/L (ref 11–51)

## 2019-11-30 LAB — COMPREHENSIVE METABOLIC PANEL
ALT: 20 U/L (ref 0–44)
AST: 17 U/L (ref 15–41)
Albumin: 3.5 g/dL (ref 3.5–5.0)
Alkaline Phosphatase: 88 U/L (ref 38–126)
Anion gap: 9 (ref 5–15)
BUN: 17 mg/dL (ref 6–20)
CO2: 22 mmol/L (ref 22–32)
Calcium: 9.1 mg/dL (ref 8.9–10.3)
Chloride: 104 mmol/L (ref 98–111)
Creatinine, Ser: 0.87 mg/dL (ref 0.44–1.00)
GFR calc Af Amer: >60 mL/min (ref >60)
GFR calc non Af Amer: >60 mL/min (ref >60)
Glucose, Bld: 201 mg/dL — ABNORMAL HIGH (ref 70–99)
Potassium: 3.5 mmol/L (ref 3.5–5.1)
Sodium: 135 mmol/L (ref 135–145)
Total Bilirubin: 0.3 mg/dL (ref 0.3–1.2)
Total Protein: 7.4 g/dL (ref 6.5–8.1)

## 2019-11-30 LAB — CBC
HCT: 41 % (ref 36.0–46.0)
Hemoglobin: 12.7 g/dL (ref 12.0–15.0)
MCH: 27.9 pg (ref 26.0–34.0)
MCHC: 31 g/dL (ref 30.0–36.0)
MCV: 90.1 fL (ref 80.0–100.0)
Platelets: 270 10*3/uL (ref 150–400)
RBC: 4.55 MIL/uL (ref 3.87–5.11)
RDW: 14.9 % (ref 11.5–15.5)
WBC: 11.8 10*3/uL — ABNORMAL HIGH (ref 4.0–10.5)
nRBC: 0 % (ref 0.0–0.2)

## 2019-11-30 MED ORDER — ONDANSETRON HCL 4 MG/2ML IJ SOLN
4.0000 mg | Freq: Once | INTRAMUSCULAR | Status: AC
  Administered 2019-11-30: 4 mg via INTRAVENOUS
  Filled 2019-11-30: qty 2

## 2019-11-30 MED ORDER — MORPHINE SULFATE (PF) 4 MG/ML IV SOLN
4.0000 mg | Freq: Once | INTRAVENOUS | Status: AC
  Administered 2019-11-30: 4 mg via INTRAVENOUS
  Filled 2019-11-30: qty 1

## 2019-11-30 MED ORDER — SODIUM CHLORIDE 0.9 % IV BOLUS
1000.0000 mL | Freq: Once | INTRAVENOUS | Status: AC
  Administered 2019-11-30: 1000 mL via INTRAVENOUS

## 2019-11-30 MED ORDER — IOHEXOL 300 MG/ML  SOLN
100.0000 mL | Freq: Once | INTRAMUSCULAR | Status: AC | PRN
  Administered 2019-11-30: 100 mL via INTRAVENOUS

## 2019-11-30 NOTE — ED Triage Notes (Signed)
Pt to er, pt states that she has been having some supra pubic pain for the past week, states that motrin makes it a little better, states that a hot pack makes it worse.  Pt states went to urgent care and they attempted a pelvic and she couldn't tolerate secondary to the pain.  Pt reports pressure with urination.  Pt denies frequency.  Denies discharge.  States that it hurts more to lay down and more to move.

## 2019-11-30 NOTE — ED Notes (Signed)
Pt to ultrasound

## 2019-11-30 NOTE — ED Provider Notes (Signed)
[facility] Provider Note   CSN: [id number] Arrival date & time: 11/30/19  1611     History Chief Complaint  Patient presents with  . Abdominal Pain    [name] is a 49 y.o. female.  [name] is a 49 y.o. female with a history of myalgia, anemia, migraines, bipolar disorder, depression and anxiety, who presents to the emergency department for evaluation of left lower abdominal pain.  She reports this pain has been intermittent past week.  She states that pain seems to be worse with certain position changes and if she coughs or strains to have a bowel movement.  She reports it does not localize to 1 side.  She states that she went to urgent care today due to worsening of the pain, they tried to do a pelvic exam but due to pain she could not tolerate laying flat for the exam.  She reports pressure with urination but no dysuria or hematuria.  No fevers or chills.  Denies constipation, diarrhea or vomiting.  No diarrhea.  Denies any upper abdominal pain.  Does report she has had some nausea especially today.  Denies any vaginal discharge or bleeding.  Reports that several years ago she had a uterine ablation does not have regular periods since then just some occasional spotting.  Denies any vaginal discharge.  She has not been sexually active in about 10 years, denies concern for STD infection.  No associated chest pain or shortness of breath.        Past Medical History:  Diagnosis Date  . Anemia   . Anxiety   . Arthritis    orthopedic  . Bipolar disorder ([place])   . Depression   . Fibromyalgia   . GERD (gastroesophageal reflux disease)   . Hypotension   . IBS (irritable bowel syndrome)   . Migraine   . PTSD (post-traumatic stress disorder)    on prazosin  . Sleep apnea   . Tonsillar hypertrophy   . Uterine fibroid     Patient Active Problem List   Diagnosis Date Noted  . Uterine cramping 09/05/2016  . Bipolar I disorder, most recent  episode (or current) mixed, moderate 11/07/2013    Past Surgical History:  Procedure Laterality Date  . ENDOMETRIAL ABLATION    . TONSILLECTOMY AND ADENOIDECTOMY N/A 02/13/2018   Procedure: TONSILLECTOMY AND ADENOIDECTOMY;  Surgeon: Leta Baptist, MD;  Location: [facility];  Service: ENT;  Laterality: N/A;  . TUBAL LIGATION       OB History    Gravida  4   Para  3   Term  3   Preterm      AB  1   Living  3     SAB      TAB      Ectopic      Multiple      Live Births  3           Family History  Problem Relation Age of Onset  . Depression Mother   . Hypertension Mother   . Hyperlipidemia Mother   . Other Mother        bone degeneration  . Depression Brother   . Alcohol abuse Brother   . Alcohol abuse Brother   . Hypertension Father   . Hyperlipidemia Father   . Heart attack Father   . Diabetes Father   . Bipolar disorder Son   . ADD / ADHD Son   . Bipolar  disorder Son   . ADD / ADHD Son   . Asperger's syndrome Son   . Bipolar disorder Daughter     Social History   Tobacco Use  . Smoking status: Never Smoker  . Smokeless tobacco: Never Used  Substance Use Topics  . Alcohol use: No  . Drug use: No    Home Medications Prior to Admission medications   Medication Sig Start Date End Date Taking? Authorizing Provider  ALPRAZolam [name]) 0.5 MG tablet Take 0.5 mg by mouth 2 (two) times daily as needed. 06/29/19  Yes [provider]  BELSOMRA 20 MG TABS Take 1 tablet by mouth at bedtime as needed. 06/29/19  Yes [provider]  diclofenac Sodium (VOLTAREN) 1 % GEL Apply 4 g topically 4 (four) times daily.   Yes [provider]  DULoxetine (CYMBALTA) 60 MG capsule Take 60 mg by mouth daily. 06/29/19  Yes [provider]  gabapentin (NEURONTIN) 600 MG tablet Take 1 tablet by mouth daily. For migraines 06/29/19  Yes [provider]  LATUDA 40 MG TABS tablet Take 40 mg by mouth at bedtime. 06/29/19   Yes [provider]  Oxcarbazepine (TRILEPTAL) 300 MG tablet Take 1 tablet (300 mg total) by mouth 2 (two) times daily. Patient taking differently: Take 600 mg by mouth 2 (two) times daily.  11/07/13  Yes [name], MD  pregabalin (LYRICA) 75 MG capsule Take 1 capsule by mouth daily. 06/29/19  Yes [provider]  vitamin B-12 (CYANOCOBALAMIN) 100 MCG tablet Take 100 mcg by mouth daily.   Yes [provider]  alprazolam [name]) 2 MG tablet Take 1 tablet (2 mg total) by mouth 3 (three) times daily as needed. 11/07/13   [name], MD  B Complex-C-E-Zn (B COMPLEX-C-E-ZINC) tablet Take 1 tablet by mouth daily.    [provider]  norethindrone (MICRONOR) 0.35 MG tablet Take 1 tablet (0.35 mg total) by mouth daily. 05/06/19   [name], MD    Allergies    Peanut-containing drug products, Tegretol [carbamazepine], Lithium, Sweet potato, and Penicillins  Review of Systems   Review of Systems  Constitutional: Negative for chills and fever.  HENT: Negative.   Respiratory: Negative for cough and shortness of breath.   Cardiovascular: Negative for chest pain.  Gastrointestinal: Positive for abdominal pain and nausea. Negative for blood in stool, constipation, diarrhea and vomiting.  Genitourinary: Negative for dysuria, frequency, hematuria, vaginal bleeding, vaginal discharge and vaginal pain.  Musculoskeletal: Negative for arthralgias and myalgias.  Skin: Negative for color change and rash.  Neurological: Negative for dizziness, syncope and light-headedness.    Physical Exam Updated Vital Signs BP 125/84 (BP Location: Left Arm)   Pulse (!) 132   Temp 97.9 F (36.6 C) (Oral)   Resp 20   Ht 5' 7.5" (1.715 m)   Wt 108.4 kg   SpO2 97%   BMI 36.88 kg/m   Physical Exam Vitals and nursing note reviewed.  Constitutional:      General: She is not in acute distress.    Appearance: She is well-developed. She is obese. She is not diaphoretic.      Comments: Pt appears uncomfortable, but is in no acute distress  HENT:     Head: Normocephalic and atraumatic.  Eyes:     General:        Right eye: No discharge.        Left eye: No discharge.  Cardiovascular:     Rate and Rhythm:  Normal rate and regular rhythm.     Heart sounds: Normal heart sounds.  Pulmonary:     Effort: Pulmonary effort is normal. No respiratory distress.     Breath sounds: Normal breath sounds. No wheezing or rales.     Comments: Respirations equal and unlabored, patient able to speak in full sentences, lungs clear to auscultation bilaterally Abdominal:     General: Bowel sounds are normal. There is no distension.     Palpations: Abdomen is soft. There is no mass.     Tenderness: There is abdominal tenderness in the right lower quadrant, suprapubic area and left lower quadrant. There is no guarding.     Comments: Tenderness to palpation across the lower abdomen without guarding or peritoneal signs, abdomen otherwise nontender, bowel sounds present throughout, no CVA tenderness  Genitourinary:    Comments: Deferred by patient Musculoskeletal:        General: No deformity.     Cervical back: Neck supple.  Skin:    General: Skin is warm and dry.     Capillary Refill: Capillary refill takes less than 2 seconds.  Neurological:     Mental Status: She is alert.     Coordination: Coordination normal.     Comments: Speech is clear, able to follow commands Moves extremities without ataxia, coordination intact  Psychiatric:        Mood and Affect: Mood normal.        Behavior: Behavior normal.     ED Results / Procedures / Treatments   Labs (all labs ordered are listed, but only abnormal results are displayed) Labs Reviewed  COMPREHENSIVE METABOLIC PANEL - Abnormal; Notable for the following components:      Result Value   Glucose, Bld 201 (*)    All other components within normal limits  CBC - Abnormal; Notable for the following components:   WBC 11.8  (*)    All other components within normal limits  URINALYSIS, ROUTINE W REFLEX MICROSCOPIC - Abnormal; Notable for the following components:   Glucose, UA 150 (*)    All other components within normal limits  LIPASE, BLOOD    EKG None  Radiology CT ABDOMEN PELVIS W CONTRAST  Result Date: 11/30/2019 CLINICAL DATA:  Lower abdominal pain for 1 week EXAM: CT ABDOMEN AND PELVIS WITH CONTRAST TECHNIQUE: Multidetector CT imaging of the abdomen and pelvis was performed using the standard protocol following bolus administration of intravenous contrast. CONTRAST:  14mL OMNIPAQUE IOHEXOL 300 MG/ML  SOLN COMPARISON:  None. FINDINGS: Lower chest: No acute abnormality. Hepatobiliary: Gallbladder is decompressed with multiple gallstones within. The liver is within normal limits. Pancreas: Unremarkable. No pancreatic ductal dilatation or surrounding inflammatory changes. Spleen: Normal in size without focal abnormality. Adrenals/Urinary Tract: Adrenal glands are unremarkable. Kidneys demonstrate a normal enhancement pattern bilaterally. No renal calculi are seen. Normal excretion of contrast is noted. Bladder is decompressed. Stomach/Bowel: The appendix is within normal limits. No obstructive or inflammatory changes of the colon are seen. Small bowel and stomach are unremarkable with the exception of a small sliding-type hiatal hernia. Vascular/Lymphatic: No significant vascular findings are present. No enlarged abdominal or pelvic lymph nodes. Reproductive: Uterus and bilateral adnexa are unremarkable. Ovarian cystic changes noted without complicating factors. Other: No abdominal wall hernia or abnormality. No abdominopelvic ascites. Musculoskeletal: No acute or significant osseous findings. IMPRESSION: Sliding-type hiatal hernia. Cholelithiasis without complicating factors. Ovarian cystic changes. Electronically Signed   By: Inez Catalina M.D.   On: 11/30/2019 18:16   US PELVIC  COMPLETE W TRANSVAGINAL AND TORSION  R/O  Result Date: 11/30/2019 CLINICAL DATA:  Pain and pressure EXAM: TRANSABDOMINAL AND TRANSVAGINAL ULTRASOUND OF PELVIS DOPPLER ULTRASOUND OF OVARIES TECHNIQUE: Both transabdominal and transvaginal ultrasound examinations of the pelvis were performed. Transabdominal technique was performed for global imaging of the pelvis including uterus, ovaries, adnexal regions, and pelvic cul-de-sac. It was necessary to proceed with endovaginal exam following the transabdominal exam to visualize the . Color and duplex Doppler ultrasound was utilized to evaluate blood flow to the ovaries. COMPARISON:  None. FINDINGS: Uterus Measurements: 9.3 x 5.0 x 3.2 cm = volume: 80 mL. No fibroids or other mass visualized. Endometrium Thickness: 2.2 mm. No focal abnormality visualized. A small amount of fluid in the endometrial canal. Right ovary Measurements: 3.0 x 1.6 x 2.2 cm = volume: 5.6 mL. Normal appearance/no adnexal mass. Left ovary Measurements: 3.2 x 1.6 x 2.5 cm = volume: 6.7 mL. Normal appearance/no adnexal mass. Pulsed Doppler evaluation of both ovaries demonstrates normal low-resistance arterial and venous waveforms. Other findings No abnormal free fluid. IMPRESSION: Small amount of fluid in the endometrial canal. Otherwise normal examination. Electronically Signed   By: Prudencio Pair M.D.   On: 11/30/2019 20:48    Procedures Procedures (including critical care time)  Medications Ordered in ED Medications  ondansetron (ZOFRAN) injection 4 mg (4 mg Intravenous Given 11/30/19 1737)  sodium chloride 0.9 % bolus 1,000 mL (0 mLs Intravenous Stopped 11/30/19 2215)  morphine 4 MG/ML injection 4 mg (4 mg Intravenous Given 11/30/19 1737)  iohexol (OMNIPAQUE) 300 MG/ML solution 100 mL (100 mLs Intravenous Contrast Given 11/30/19 1747)    ED Course  I have reviewed the triage vital signs and the nursing notes.  Pertinent labs & imaging results that were available during my care of the patient were reviewed by me and  considered in my medical decision making (see chart for details).    MDM Rules/Calculators/A&P                      Patient presents to the ED with complaints of abdominal pain. Patient nontoxic appearing, in no apparent distress, vitals WNL.  Patient became tachycardic during initial evaluation while she was having IV placed when she had a vasovagal reaction, without loss of consciousness.  Otherwise has not had any episodes of tachycardia and vitals have been normal however.  On exam patient tender to palpation across the lower abdoman, no peritoneal signs. Will evaluate with labs and CT. Analgesics, anti-emetics, and fluids administered.   ER work-up reviewed:  CBC: Mild leukocytosis, normal hemoglobin CMP: Glucose of 201, no other significant electrolyte derangements, normal renal and liver function Lipase: WNL UA: No hematuria or signs of infection Preg test: N/A, previous uterine ablation Imaging: CT shows possible cystic changes of the ovaries, also shows sliding hiatal hernia and gallstones without other signs of gallbladder disease.  Given lower abdominal pain with cystic changes in the ovaries you get pelvic ultrasound to rule out ovarian torsion.  Patient has not been sexually active in over 10 years and I have low suspicion for infection or PID.  Pelvic ultrasound without obvious evidence of ovarian cyst, no evidence of torsion and normal blood flow to bilateral ovaries, no uterine fibroids or other acute abnormalities, trace amount of fluid noted in the endometrial canal.  On repeat abdominal exam patient remains without peritoneal signs, doubt cholecystitis, pancreatitis, diverticulitis, appendicitis, bowel obstruction/perforation, PID, or ectopic pregnancy. Patient tolerating PO in the emergency department. Will discharge home  with supportive measures. I discussed results, treatment plan, need for PCP follow-up, and return precautions with the patient. Provided opportunity for  questions, patient confirmed understanding and is in agreement with plan.     Final Clinical Impression(s) / ED Diagnoses Final diagnoses:  Lower abdominal pain  Hiatal hernia  Gallstones  Cystic disease of ovaries    Rx / DC Orders ED Discharge Orders    None       Janet Berlin 12/01/19 1458    Davonna Belling, MD 12/01/19 2336

## 2019-11-30 NOTE — Discharge Instructions (Signed)
Your work-up today has overall been reassuring.  Your CT scan shows some cystic changes of your ovaries that may be contributing to your pain, we also saw that you have some gallstones and a hiatal hernia.  It also looks like you may have some stool backed up in your colon, you can try using MiraLAX every day or every other day to keep your bowels moving more regularly.  Please follow-up with your PCP, OB/GYN and GI doctor.  Return if you have new or worsening abdominal pain, fevers or any other new or concerning symptoms.

## 2019-12-04 DIAGNOSIS — Z299 Encounter for prophylactic measures, unspecified: Secondary | ICD-10-CM | POA: Diagnosis not present

## 2019-12-04 DIAGNOSIS — R109 Unspecified abdominal pain: Secondary | ICD-10-CM | POA: Diagnosis not present

## 2019-12-09 DIAGNOSIS — M62838 Other muscle spasm: Secondary | ICD-10-CM | POA: Diagnosis not present

## 2019-12-09 DIAGNOSIS — M25561 Pain in right knee: Secondary | ICD-10-CM | POA: Diagnosis not present

## 2019-12-09 DIAGNOSIS — M7050 Other bursitis of knee, unspecified knee: Secondary | ICD-10-CM | POA: Diagnosis not present

## 2019-12-09 DIAGNOSIS — Z299 Encounter for prophylactic measures, unspecified: Secondary | ICD-10-CM | POA: Diagnosis not present

## 2019-12-09 DIAGNOSIS — Z789 Other specified health status: Secondary | ICD-10-CM | POA: Diagnosis not present

## 2019-12-09 DIAGNOSIS — M461 Sacroiliitis, not elsewhere classified: Secondary | ICD-10-CM | POA: Diagnosis not present

## 2019-12-11 ENCOUNTER — Encounter: Payer: Self-pay | Admitting: Internal Medicine

## 2019-12-11 DIAGNOSIS — K219 Gastro-esophageal reflux disease without esophagitis: Secondary | ICD-10-CM | POA: Diagnosis not present

## 2019-12-11 DIAGNOSIS — E11649 Type 2 diabetes mellitus with hypoglycemia without coma: Secondary | ICD-10-CM | POA: Diagnosis not present

## 2019-12-11 DIAGNOSIS — Z299 Encounter for prophylactic measures, unspecified: Secondary | ICD-10-CM | POA: Diagnosis not present

## 2020-01-15 DIAGNOSIS — E11649 Type 2 diabetes mellitus with hypoglycemia without coma: Secondary | ICD-10-CM | POA: Diagnosis not present

## 2020-01-21 NOTE — Progress Notes (Deleted)
Referring Provider: Monico Blitz, MD Primary Care Physician:  Monico Blitz, MD Primary Gastroenterologist:  Dr. Abbey Chatters (Dr. Gala Romney following for now)  No chief complaint on file.   HPI:   [name] is a 49 y.o. female presenting today at the request of Monico Blitz, MD for abdominal pain and GERD.  Patient was seen in the emergency room on 11/30/2019 for lower abdominal pain that had been intermittent for the past week.  Worse with certain positions or when coughing or straining to have a BM.  Associated nausea.  Reported pressure when urinating but no dysuria or hematuria.  No upper abdominal pain.  ER work-up revealed mild leukocytosis, normal hemoglobin, renal function and liver function within normal limits, lipase within normal limits, UA negative.  CT with possible cystic changes of the ovaries, small sliding hiatal hernia, and gallstones without other signs of gallbladder disease.  Follow-up pelvic ultrasound without obvious evidence of ovarian cyst, no evidence of torsion and normal blood flow to bilateral ovaries, no uterine fibroids or other acute abnormalities.  Today:   Needs first ever colonoscopy. - ASA II  Past Medical History:  Diagnosis Date  . Anemia   . Anxiety   . Arthritis    orthopedic  . Bipolar disorder ([place])   . Depression   . Fibromyalgia   . GERD (gastroesophageal reflux disease)   . Hypotension   . IBS (irritable bowel syndrome)   . Migraine   . PTSD (post-traumatic stress disorder)    on prazosin  . Sleep apnea   . Tonsillar hypertrophy   . Uterine fibroid     Past Surgical History:  Procedure Laterality Date  . ENDOMETRIAL ABLATION    . TONSILLECTOMY AND ADENOIDECTOMY N/A 02/13/2018   Procedure: TONSILLECTOMY AND ADENOIDECTOMY;  Surgeon: Leta Baptist, MD;  Location: [facility];  Service: ENT;  Laterality: N/A;  . TUBAL LIGATION      Current Outpatient Medications  Medication Sig Dispense Refill  . ALPRAZolam (XANAX)  0.5 MG tablet Take 0.5 mg by mouth 2 (two) times daily as needed.    Marland Kitchen alprazolam (XANAX) 2 MG tablet Take 1 tablet (2 mg total) by mouth 3 (three) times daily as needed. 90 tablet 2  . B Complex-C-E-Zn (B COMPLEX-C-E-ZINC) tablet Take 1 tablet by mouth daily.    . BELSOMRA 20 MG TABS Take 1 tablet by mouth at bedtime as needed.    . diclofenac Sodium (VOLTAREN) 1 % GEL Apply 4 g topically 4 (four) times daily.    . DULoxetine (CYMBALTA) 60 MG capsule Take 60 mg by mouth daily.    Marland Kitchen gabapentin (NEURONTIN) 600 MG tablet Take 1 tablet by mouth daily. For migraines    . LATUDA 40 MG TABS tablet Take 40 mg by mouth at bedtime.    . norethindrone (MICRONOR) 0.35 MG tablet Take 1 tablet (0.35 mg total) by mouth daily. 1 Package 11  . Oxcarbazepine (TRILEPTAL) 300 MG tablet Take 1 tablet (300 mg total) by mouth 2 (two) times daily. (Patient taking differently: Take 600 mg by mouth 2 (two) times daily. ) 60 tablet 2  . pregabalin (LYRICA) 75 MG capsule Take 1 capsule by mouth daily.    . vitamin B-12 (CYANOCOBALAMIN) 100 MCG tablet Take 100 mcg by mouth daily.     No current facility-administered medications for this visit.    Allergies as of 01/22/2020 - Review Complete 11/30/2019  Allergen Reaction Noted  . Peanut-containing drug products Anaphylaxis 02/13/2018  .  Tegretol [carbamazepine] Shortness Of Breath and Swelling 10/05/2013  . Lithium Other (See Comments) 10/05/2013  . Sweet potato Itching 10/05/2013  . Penicillins Rash 10/05/2013    Family History  Problem Relation Age of Onset  . Depression Mother   . Hypertension Mother   . Hyperlipidemia Mother   . Other Mother        bone degeneration  . Depression Brother   . Alcohol abuse Brother   . Alcohol abuse Brother   . Hypertension Father   . Hyperlipidemia Father   . Heart attack Father   . Diabetes Father   . Bipolar disorder Son   . ADD / ADHD Son   . Bipolar disorder Son   . ADD / ADHD Son   . Asperger's syndrome Son     . Bipolar disorder Daughter     Social History   Socioeconomic History  . Marital status: Divorced    Spouse name: Not on file  . Number of children: Not on file  . Years of education: Not on file  . Highest education level: Not on file  Occupational History  . Not on file  Tobacco Use  . Smoking status: Never Smoker  . Smokeless tobacco: Never Used  Vaping Use  . Vaping Use: Never used  Substance and Sexual Activity  . Alcohol use: No  . Drug use: No  . Sexual activity: Not Currently    Birth control/protection: Surgical    Comment: tubal and ablation  Other Topics Concern  . Not on file  Social History Narrative  . Not on file   Social Determinants of Health   Financial Resource Strain:   . Difficulty of Paying Living Expenses:   Food Insecurity:   . Worried About Charity fundraiser in the Last Year:   . Arboriculturist in the Last Year:   Transportation Needs:   . Film/video editor (Medical):   Marland Kitchen Lack of Transportation (Non-Medical):   Physical Activity:   . Days of Exercise per Week:   . Minutes of Exercise per Session:   Stress:   . Feeling of Stress :   Social Connections:   . Frequency of Communication with Friends and Family:   . Frequency of Social Gatherings with Friends and Family:   . Attends Religious Services:   . Active Member of Clubs or Organizations:   . Attends Archivist Meetings:   Marland Kitchen Marital Status:   Intimate Partner Violence:   . Fear of Current or Ex-Partner:   . Emotionally Abused:   Marland Kitchen Physically Abused:   . Sexually Abused:     Review of Systems: Gen: Denies any fever, chills, fatigue, weight loss, lack of appetite.  CV: Denies chest pain, heart palpitations, peripheral edema, syncope.  Resp: Denies shortness of breath at rest or with exertion. Denies wheezing or cough.  GI: Denies dysphagia or odynophagia. Denies jaundice, hematemesis, fecal incontinence. GU : Denies urinary burning, urinary frequency, urinary  hesitancy MS: Denies joint pain, muscle weakness, cramps, or limitation of movement.  Derm: Denies rash, itching, dry skin Psych: Denies depression, anxiety, memory loss, and confusion Heme: Denies bruising, bleeding, and enlarged lymph nodes.  Physical Exam: There were no vitals taken for this visit. General:   Alert and oriented. Pleasant and cooperative. Well-nourished and well-developed.  Head:  Normocephalic and atraumatic. Eyes:  Without icterus, sclera clear and conjunctiva pink.  Ears:  Normal auditory acuity. Nose:  No deformity, discharge,  or lesions. Mouth:  No deformity or lesions, oral mucosa pink.  Neck:  Supple, without mass or thyromegaly. Lungs:  Clear to auscultation bilaterally. No wheezes, rales, or rhonchi. No distress.  Heart:  S1, S2 present without murmurs appreciated.  Abdomen:  +BS, soft, non-tender and non-distended. No HSM noted. No guarding or rebound. No masses appreciated.  Rectal:  Deferred  Msk:  Symmetrical without gross deformities. Normal posture. Pulses:  Normal pulses noted. Extremities:  Without clubbing or edema. Neurologic:  Alert and  oriented x4;  grossly normal neurologically. Skin:  Intact without significant lesions or rashes. Cervical Nodes:  No significant cervical adenopathy. Psych:  Alert and cooperative. Normal mood and affect.

## 2020-01-22 ENCOUNTER — Ambulatory Visit: Payer: Medicare Other | Admitting: Gastroenterology

## 2020-01-22 ENCOUNTER — Encounter: Payer: Self-pay | Admitting: Internal Medicine

## 2020-03-17 DIAGNOSIS — E11649 Type 2 diabetes mellitus with hypoglycemia without coma: Secondary | ICD-10-CM | POA: Diagnosis not present

## 2020-03-20 DIAGNOSIS — G4700 Insomnia, unspecified: Secondary | ICD-10-CM | POA: Diagnosis not present

## 2020-03-20 DIAGNOSIS — K219 Gastro-esophageal reflux disease without esophagitis: Secondary | ICD-10-CM | POA: Diagnosis not present

## 2020-03-20 DIAGNOSIS — Z299 Encounter for prophylactic measures, unspecified: Secondary | ICD-10-CM | POA: Diagnosis not present

## 2020-03-20 DIAGNOSIS — M2550 Pain in unspecified joint: Secondary | ICD-10-CM | POA: Diagnosis not present

## 2020-03-26 DIAGNOSIS — U071 COVID-19: Secondary | ICD-10-CM | POA: Diagnosis not present

## 2020-03-26 DIAGNOSIS — R05 Cough: Secondary | ICD-10-CM | POA: Diagnosis not present

## 2020-03-26 DIAGNOSIS — R079 Chest pain, unspecified: Secondary | ICD-10-CM | POA: Diagnosis not present

## 2020-03-29 ENCOUNTER — Emergency Department (HOSPITAL_COMMUNITY): Payer: Medicare Other

## 2020-03-29 ENCOUNTER — Emergency Department (HOSPITAL_COMMUNITY)
Admission: EM | Admit: 2020-03-29 | Discharge: 2020-03-29 | Disposition: A | Discharge status: Home / Self Care | Payer: Medicare Other | Attending: Emergency Medicine | Admitting: Emergency Medicine

## 2020-03-29 ENCOUNTER — Encounter (HOSPITAL_COMMUNITY): Payer: Self-pay | Admitting: Emergency Medicine

## 2020-03-29 ENCOUNTER — Other Ambulatory Visit: Payer: Self-pay

## 2020-03-29 DIAGNOSIS — U071 COVID-19: Secondary | ICD-10-CM | POA: Diagnosis not present

## 2020-03-29 DIAGNOSIS — Z91010 Allergy to peanuts: Secondary | ICD-10-CM | POA: Diagnosis not present

## 2020-03-29 DIAGNOSIS — R0602 Shortness of breath: Secondary | ICD-10-CM | POA: Diagnosis not present

## 2020-03-29 DIAGNOSIS — Z79899 Other long term (current) drug therapy: Secondary | ICD-10-CM | POA: Diagnosis not present

## 2020-03-29 DIAGNOSIS — J1282 Pneumonia due to coronavirus disease 2019: Secondary | ICD-10-CM | POA: Insufficient documentation

## 2020-03-29 DIAGNOSIS — Z23 Encounter for immunization: Secondary | ICD-10-CM | POA: Diagnosis not present

## 2020-03-29 DIAGNOSIS — I517 Cardiomegaly: Secondary | ICD-10-CM | POA: Diagnosis not present

## 2020-03-29 LAB — COMPREHENSIVE METABOLIC PANEL
ALT: 29 U/L (ref 0–44)
AST: 42 U/L — ABNORMAL HIGH (ref 15–41)
Albumin: 3 g/dL — ABNORMAL LOW (ref 3.5–5.0)
Alkaline Phosphatase: 90 U/L (ref 38–126)
Anion gap: 8 (ref 5–15)
BUN: 7 mg/dL (ref 6–20)
CO2: 27 mmol/L (ref 22–32)
Calcium: 8.1 mg/dL — ABNORMAL LOW (ref 8.9–10.3)
Chloride: 99 mmol/L (ref 98–111)
Creatinine, Ser: 0.86 mg/dL (ref 0.44–1.00)
GFR calc Af Amer: >60 mL/min (ref >60)
GFR calc non Af Amer: >60 mL/min (ref >60)
Glucose, Bld: 239 mg/dL — ABNORMAL HIGH (ref 70–99)
Potassium: 3.8 mmol/L (ref 3.5–5.1)
Sodium: 134 mmol/L — ABNORMAL LOW (ref 135–145)
Total Bilirubin: 0.2 mg/dL — ABNORMAL LOW (ref 0.3–1.2)
Total Protein: 6.9 g/dL (ref 6.5–8.1)

## 2020-03-29 LAB — CBC WITH DIFFERENTIAL/PLATELET
Abs Immature Granulocytes: 0.13 10*3/uL — ABNORMAL HIGH (ref 0.00–0.07)
Basophils Absolute: 0 10*3/uL (ref 0.0–0.1)
Basophils Relative: 0 %
Eosinophils Absolute: 0 10*3/uL (ref 0.0–0.5)
Eosinophils Relative: 0 %
HCT: 38.3 % (ref 36.0–46.0)
Hemoglobin: 11.7 g/dL — ABNORMAL LOW (ref 12.0–15.0)
Immature Granulocytes: 3 %
Lymphocytes Relative: 14 %
Lymphs Abs: 0.7 10*3/uL (ref 0.7–4.0)
MCH: 27.9 pg (ref 26.0–34.0)
MCHC: 30.5 g/dL (ref 30.0–36.0)
MCV: 91.2 fL (ref 80.0–100.0)
Monocytes Absolute: 0.3 10*3/uL (ref 0.1–1.0)
Monocytes Relative: 6 %
Neutro Abs: 4 10*3/uL (ref 1.7–7.7)
Neutrophils Relative %: 77 %
Platelets: 159 10*3/uL (ref 150–400)
RBC: 4.2 MIL/uL (ref 3.87–5.11)
RDW: 14.6 % (ref 11.5–15.5)
WBC: 5.1 10*3/uL (ref 4.0–10.5)
nRBC: 0 % (ref 0.0–0.2)

## 2020-03-29 MED ORDER — SODIUM CHLORIDE 0.9 % IV SOLN: INTRAVENOUS | Status: DC | PRN

## 2020-03-29 MED ORDER — SODIUM CHLORIDE 0.9 % IV SOLN: 1200.0000 mg | Freq: Once | INTRAVENOUS | Status: DC

## 2020-03-29 MED ORDER — DIPHENHYDRAMINE HCL 50 MG/ML IJ SOLN: 50.0000 mg | Freq: Once | INTRAMUSCULAR | Status: DC | PRN

## 2020-03-29 MED ORDER — ALBUTEROL SULFATE HFA 108 (90 BASE) MCG/ACT IN AERS
2.0000 | INHALATION_SPRAY | Freq: Once | RESPIRATORY_TRACT | Status: AC
  Administered 2020-03-29: 2 via RESPIRATORY_TRACT

## 2020-03-29 MED ORDER — EPINEPHRINE 0.3 MG/0.3ML IJ SOAJ: 0.3000 mg | Freq: Once | INTRAMUSCULAR | Status: DC | PRN

## 2020-03-29 MED ORDER — FAMOTIDINE IN NACL 20-0.9 MG/50ML-% IV SOLN: 20.0000 mg | Freq: Once | INTRAVENOUS | Status: DC | PRN

## 2020-03-29 MED ORDER — ALBUTEROL SULFATE HFA 108 (90 BASE) MCG/ACT IN AERS
2.0000 | INHALATION_SPRAY | Freq: Once | RESPIRATORY_TRACT | Status: DC | PRN
  Filled 2020-03-29: qty 6.7

## 2020-03-29 MED ORDER — METHYLPREDNISOLONE SODIUM SUCC 125 MG IJ SOLR: 125.0000 mg | Freq: Once | INTRAMUSCULAR | Status: DC | PRN

## 2020-03-29 MED ORDER — SODIUM CHLORIDE 0.9 % IV SOLN
1200.0000 mg | Freq: Once | INTRAVENOUS | Status: AC
  Administered 2020-03-29: 1200 mg via INTRAVENOUS
  Filled 2020-03-29: qty 10

## 2020-03-29 NOTE — ED Provider Notes (Signed)
**[name]** Jordan [name]   CSN: [id number] Arrival date & time: 03/29/20  0530     History Chief Complaint  Patient presents with  . Bernerd Pho    Jordan [name] is a 49 y.o. female.  The history is provided by the patient. No language interpreter was used.  Shortness of Breath Severity:  Moderate Onset quality:  Gradual Duration:  3 days Timing:  Constant Progression:  Worsening Chronicity:  New Context: URI   Relieved by:  Nothing Worsened by:  Nothing Ineffective treatments:  None tried Associated symptoms: wheezing    Pt tested positive for Covid on 9/9.  Pt reports increased shortness of breath and cough    Past Medical History:  Diagnosis Date  . Anemia   . Anxiety   . Arthritis    orthopedic  . Bipolar disorder ([place])   . Depression   . Fibromyalgia   . GERD (gastroesophageal reflux disease)   . Hypotension   . IBS (irritable bowel syndrome)   . Migraine   . PTSD (post-traumatic stress disorder)    on prazosin  . Sleep apnea   . Tonsillar hypertrophy   . Uterine fibroid     Patient Active Problem List   Diagnosis Date Noted  . Uterine cramping 09/05/2016  . Bipolar I disorder, most recent episode (or current) mixed, moderate 11/07/2013    Past Surgical History:  Procedure Laterality Date  . ENDOMETRIAL ABLATION    . TONSILLECTOMY AND ADENOIDECTOMY N/A 02/13/2018   Procedure: TONSILLECTOMY AND ADENOIDECTOMY;  Surgeon: Leta Baptist, MD;  Location: [facility];  Service: ENT;  Laterality: N/A;  . TUBAL LIGATION       OB History    Gravida  4   Para  3   Term  3   Preterm      AB  1   Living  3     SAB      TAB      Ectopic      Multiple      Live Births  3           Family History  Problem Relation Age of Onset  . Depression Mother   . Hypertension Mother   . Hyperlipidemia Mother   . Other Mother        bone degeneration  . Depression Brother   . Alcohol abuse Brother   .  Alcohol abuse Brother   . Hypertension Father   . Hyperlipidemia Father   . Heart attack Father   . Diabetes Father   . Bipolar disorder Son   . ADD / ADHD Son   . Bipolar disorder Son   . ADD / ADHD Son   . Asperger's syndrome Son   . Bipolar disorder Daughter     Social History   Tobacco Use  . Smoking status: Never Smoker  . Smokeless tobacco: Never Used  Vaping Use  . Vaping Use: Never used  Substance Use Topics  . Alcohol use: No  . Drug use: No    Home Medications Prior to Admission medications   Medication Sig Start Date End Date Taking? Authorizing Provider  ALPRAZolam [name]) 0.5 MG tablet Take 0.5 mg by mouth 2 (two) times daily as needed. 06/29/19   [provider]  alprazolam [name]) 2 MG tablet Take 1 tablet (2 mg total) by mouth 3 (three) times daily as needed. 11/07/13   [name], MD  B Complex-C-E-Zn (B COMPLEX-C-E-ZINC) tablet Take  1 tablet by mouth daily.    [provider]  BELSOMRA 20 MG TABS Take 1 tablet by mouth at bedtime as needed. 06/29/19   [provider]  diclofenac Sodium (VOLTAREN) 1 % GEL Apply 4 g topically 4 (four) times daily.    [provider]  DULoxetine (CYMBALTA) 60 MG capsule Take 60 mg by mouth daily. 06/29/19   [provider]  gabapentin (NEURONTIN) 600 MG tablet Take 1 tablet by mouth daily. For migraines 06/29/19   [provider]  LATUDA 40 MG TABS tablet Take 40 mg by mouth at bedtime. 06/29/19   [provider]  norethindrone (MICRONOR) 0.35 MG tablet Take 1 tablet (0.35 mg total) by mouth daily. 05/06/19   [name], MD  Oxcarbazepine (TRILEPTAL) 300 MG tablet Take 1 tablet (300 mg total) by mouth 2 (two) times daily. Patient taking differently: Take 600 mg by mouth 2 (two) times daily.  11/07/13   [name], MD  pregabalin (LYRICA) 75 MG capsule Take 1 capsule by mouth daily. 06/29/19   [provider]  vitamin B-12 (CYANOCOBALAMIN) 100  MCG tablet Take 100 mcg by mouth daily.    [provider]    Allergies    Peanut-containing drug products, Tegretol [carbamazepine], Lithium, Sweet potato, and Penicillins  Review of Systems   Review of Systems  Respiratory: Positive for shortness of breath and wheezing.   All other systems reviewed and are negative.   Physical Exam Updated Vital Signs BP 117/68 (BP Location: Right Arm)   Pulse 89   Temp 98.9 F (37.2 C) (Oral)   Resp 20   Ht 5\' 7"  (1.702 m)   Wt 106.1 kg   SpO2 94%   BMI 36.65 kg/m   Physical Exam Vitals and nursing [name] reviewed.  Constitutional:      Appearance: She is well-developed.  HENT:     Head: Normocephalic.  Cardiovascular:     Rate and Rhythm: Normal rate and regular rhythm.  Pulmonary:     Effort: Pulmonary effort is normal.     Breath sounds: Wheezing present.  Abdominal:     General: There is no distension.  Musculoskeletal:        General: Normal range of motion.     Cervical back: Normal range of motion.  Skin:    General: Skin is warm.  Neurological:     General: No focal deficit present.     Mental Status: She is alert and oriented to person, place, and time.  Psychiatric:        Mood and Affect: Mood normal.     ED Results / Procedures / Treatments   Labs (all labs ordered are listed, but only abnormal results are displayed) Labs Reviewed  CBC WITH DIFFERENTIAL/PLATELET  COMPREHENSIVE METABOLIC PANEL    EKG None  Radiology DG Chest Portable 1 View  Result Date: 03/29/2020 CLINICAL DATA:  49 year old female with recent COVID-19 diagnosed on 02/24/2020. Patient now presents with increasing shortness of breath. EXAM: PORTABLE CHEST 1 VIEW COMPARISON:  Prior chest x-ray 03/26/2020 FINDINGS: Marked interval progression of patchy airspace opacity scattered throughout the periphery and inferior aspects of both lungs. Overall, inspiratory volumes are low. Stable mild cardiomegaly. No pneumothorax. No acute  osseous abnormality. IMPRESSION: 1. Marked interval progression of bilateral patchy airspace opacities in the periphery and inferior aspect of both lungs compared to prior radiographs from 03/26/2020. Findings are concerning for progressive COVID pneumonia. 2. Low inspiratory volumes. Electronically Signed  By: Jacqulynn Cadet M.D.   On: 03/29/2020 09:11    Procedures Procedures (including critical care time)  Medications Ordered in ED Medications  casirivimab-imdevimab (REGEN-COV) 1,200 mg in sodium chloride 0.9 % 110 mL IVPB (has no administration in time range)  0.9 %  sodium chloride infusion (has no administration in time range)  diphenhydrAMINE (BENADRYL) injection 50 mg (has no administration in time range)  famotidine (PEPCID) IVPB 20 mg premix (has no administration in time range)  methylPREDNISolone sodium succinate (SOLU-MEDROL) 125 mg/2 mL injection 125 mg (has no administration in time range)  albuterol (VENTOLIN HFA) 108 (90 Base) MCG/ACT inhaler 2 puff (has no administration in time range)  EPINEPHrine (EPI-PEN) injection 0.3 mg (has no administration in time range)  albuterol (VENTOLIN HFA) 108 (90 Base) MCG/ACT inhaler 2 puff (has no administration in time range)    ED Course  I have reviewed the triage vital signs and the nursing notes.  Pertinent labs & imaging results that were available during my care of the patient were reviewed by me and considered in my medical decision making (see chart for details).    MDM Rules/Calculators/A&P                          MDM:  Pt has pneumonia on chest xray,  Pt meets criteria for mab treatment.   Pt given albuterol inhaler Pt reevaluated after treatment.  02 sats 99%.  No side effects from infusion  Final Clinical Impression(s) / ED Diagnoses Final diagnoses:  Pneumonia due to COVID-19 virus    Rx / DC Orders ED Discharge Orders    None    An After Visit Summary was printed and given to the patient.    Fransico Meadow, [place] 03/29/20 1347    Virgel Manifold, MD 03/29/20 1358

## 2020-03-29 NOTE — Discharge Instructions (Addendum)
Tylenol every 4 hours. Albuterol inhaler 2 puffs every 4 hours.

## 2020-03-29 NOTE — ED Triage Notes (Signed)
Pt reports tested positive for Covid in the ED at [facility] on aug 9.  C/O OSB with movement and lower back pain.

## 2020-03-29 NOTE — ED Notes (Signed)
Pt taken to the bathroom per wheelchair. Pt did well, however their oxygen went down when they stood up for a long period of time. Pt brought back to her room and on her stretcher. Pt applied pulse ox, pt.s oxygen level is now back to the 90'%

## 2020-03-29 NOTE — ED Notes (Signed)
Pt became sob when transferring from wheel chair to bed.  O2 sat 95% on room air.

## 2020-03-29 NOTE — ED Notes (Signed)
Signature pad in room not working   Pt gives verbal consent   Report made regarding signature pad

## 2020-03-30 DIAGNOSIS — E11649 Type 2 diabetes mellitus with hypoglycemia without coma: Secondary | ICD-10-CM | POA: Diagnosis not present

## 2020-03-30 DIAGNOSIS — U071 COVID-19: Secondary | ICD-10-CM | POA: Diagnosis not present

## 2020-03-30 DIAGNOSIS — Z743 Need for continuous supervision: Secondary | ICD-10-CM | POA: Diagnosis not present

## 2020-03-30 DIAGNOSIS — Z299 Encounter for prophylactic measures, unspecified: Secondary | ICD-10-CM | POA: Diagnosis not present

## 2020-04-02 DIAGNOSIS — E1165 Type 2 diabetes mellitus with hyperglycemia: Secondary | ICD-10-CM | POA: Diagnosis not present

## 2020-04-02 DIAGNOSIS — M2550 Pain in unspecified joint: Secondary | ICD-10-CM | POA: Diagnosis not present

## 2020-04-02 DIAGNOSIS — Z299 Encounter for prophylactic measures, unspecified: Secondary | ICD-10-CM | POA: Diagnosis not present

## 2020-04-02 DIAGNOSIS — U071 COVID-19: Secondary | ICD-10-CM | POA: Diagnosis not present

## 2020-04-23 DIAGNOSIS — M2550 Pain in unspecified joint: Secondary | ICD-10-CM | POA: Diagnosis not present

## 2020-04-23 DIAGNOSIS — M791 Myalgia, unspecified site: Secondary | ICD-10-CM | POA: Diagnosis not present

## 2020-04-23 DIAGNOSIS — J069 Acute upper respiratory infection, unspecified: Secondary | ICD-10-CM | POA: Diagnosis not present

## 2020-04-23 DIAGNOSIS — R059 Cough, unspecified: Secondary | ICD-10-CM | POA: Diagnosis not present

## 2020-04-23 DIAGNOSIS — Z299 Encounter for prophylactic measures, unspecified: Secondary | ICD-10-CM | POA: Diagnosis not present

## 2020-05-15 DIAGNOSIS — E11649 Type 2 diabetes mellitus with hypoglycemia without coma: Secondary | ICD-10-CM | POA: Diagnosis not present

## 2020-06-10 DIAGNOSIS — M25552 Pain in left hip: Secondary | ICD-10-CM | POA: Diagnosis not present

## 2020-06-10 DIAGNOSIS — M25551 Pain in right hip: Secondary | ICD-10-CM | POA: Diagnosis not present

## 2020-06-10 DIAGNOSIS — M461 Sacroiliitis, not elsewhere classified: Secondary | ICD-10-CM | POA: Diagnosis not present

## 2020-06-10 DIAGNOSIS — E11649 Type 2 diabetes mellitus with hypoglycemia without coma: Secondary | ICD-10-CM | POA: Diagnosis not present

## 2020-06-10 DIAGNOSIS — Z299 Encounter for prophylactic measures, unspecified: Secondary | ICD-10-CM | POA: Diagnosis not present

## 2020-06-15 DIAGNOSIS — Z299 Encounter for prophylactic measures, unspecified: Secondary | ICD-10-CM | POA: Diagnosis not present

## 2020-06-15 DIAGNOSIS — M1380 Other specified arthritis, unspecified site: Secondary | ICD-10-CM | POA: Diagnosis not present

## 2020-06-15 DIAGNOSIS — M25561 Pain in right knee: Secondary | ICD-10-CM | POA: Diagnosis not present

## 2020-06-15 DIAGNOSIS — M797 Fibromyalgia: Secondary | ICD-10-CM | POA: Diagnosis not present

## 2020-06-15 DIAGNOSIS — Z79899 Other long term (current) drug therapy: Secondary | ICD-10-CM | POA: Diagnosis not present

## 2020-06-16 DIAGNOSIS — E11649 Type 2 diabetes mellitus with hypoglycemia without coma: Secondary | ICD-10-CM | POA: Diagnosis not present

## 2020-06-22 DIAGNOSIS — Z299 Encounter for prophylactic measures, unspecified: Secondary | ICD-10-CM | POA: Diagnosis not present

## 2020-06-22 DIAGNOSIS — Z79899 Other long term (current) drug therapy: Secondary | ICD-10-CM | POA: Diagnosis not present

## 2020-06-22 DIAGNOSIS — M797 Fibromyalgia: Secondary | ICD-10-CM | POA: Diagnosis not present

## 2020-06-22 DIAGNOSIS — M62838 Other muscle spasm: Secondary | ICD-10-CM | POA: Diagnosis not present

## 2020-07-06 DIAGNOSIS — Z299 Encounter for prophylactic measures, unspecified: Secondary | ICD-10-CM | POA: Diagnosis not present

## 2020-07-06 DIAGNOSIS — E1165 Type 2 diabetes mellitus with hyperglycemia: Secondary | ICD-10-CM | POA: Diagnosis not present

## 2020-07-06 DIAGNOSIS — Z0000 Encounter for general adult medical examination without abnormal findings: Secondary | ICD-10-CM | POA: Diagnosis not present

## 2020-07-06 DIAGNOSIS — Z79899 Other long term (current) drug therapy: Secondary | ICD-10-CM | POA: Diagnosis not present

## 2020-07-06 DIAGNOSIS — R5383 Other fatigue: Secondary | ICD-10-CM | POA: Diagnosis not present

## 2020-07-06 DIAGNOSIS — Z7189 Other specified counseling: Secondary | ICD-10-CM | POA: Diagnosis not present

## 2020-08-06 DIAGNOSIS — M1380 Other specified arthritis, unspecified site: Secondary | ICD-10-CM | POA: Diagnosis not present

## 2020-08-06 DIAGNOSIS — Z299 Encounter for prophylactic measures, unspecified: Secondary | ICD-10-CM | POA: Diagnosis not present

## 2020-08-06 DIAGNOSIS — M2550 Pain in unspecified joint: Secondary | ICD-10-CM | POA: Diagnosis not present

## 2020-08-06 DIAGNOSIS — Z713 Dietary counseling and surveillance: Secondary | ICD-10-CM | POA: Diagnosis not present

## 2020-08-06 DIAGNOSIS — Z789 Other specified health status: Secondary | ICD-10-CM | POA: Diagnosis not present

## 2020-08-17 DIAGNOSIS — G43111 Migraine with aura, intractable, with status migrainosus: Secondary | ICD-10-CM | POA: Diagnosis not present

## 2020-08-17 DIAGNOSIS — G43719 Chronic migraine without aura, intractable, without status migrainosus: Secondary | ICD-10-CM | POA: Diagnosis not present

## 2020-08-17 DIAGNOSIS — M791 Myalgia, unspecified site: Secondary | ICD-10-CM | POA: Diagnosis not present

## 2020-08-17 DIAGNOSIS — E11649 Type 2 diabetes mellitus with hypoglycemia without coma: Secondary | ICD-10-CM | POA: Diagnosis not present

## 2020-08-17 DIAGNOSIS — M542 Cervicalgia: Secondary | ICD-10-CM | POA: Diagnosis not present

## 2020-08-31 DIAGNOSIS — M1380 Other specified arthritis, unspecified site: Secondary | ICD-10-CM | POA: Diagnosis not present

## 2020-08-31 DIAGNOSIS — M2550 Pain in unspecified joint: Secondary | ICD-10-CM | POA: Diagnosis not present

## 2020-08-31 DIAGNOSIS — R5383 Other fatigue: Secondary | ICD-10-CM | POA: Diagnosis not present

## 2020-09-10 DIAGNOSIS — E1165 Type 2 diabetes mellitus with hyperglycemia: Secondary | ICD-10-CM | POA: Diagnosis not present

## 2020-09-10 DIAGNOSIS — L820 Inflamed seborrheic keratosis: Secondary | ICD-10-CM | POA: Diagnosis not present

## 2020-09-10 DIAGNOSIS — E11649 Type 2 diabetes mellitus with hypoglycemia without coma: Secondary | ICD-10-CM | POA: Diagnosis not present

## 2020-09-10 DIAGNOSIS — L818 Other specified disorders of pigmentation: Secondary | ICD-10-CM | POA: Diagnosis not present

## 2020-09-10 DIAGNOSIS — Z299 Encounter for prophylactic measures, unspecified: Secondary | ICD-10-CM | POA: Diagnosis not present

## 2020-09-14 DIAGNOSIS — E11649 Type 2 diabetes mellitus with hypoglycemia without coma: Secondary | ICD-10-CM | POA: Diagnosis not present

## 2020-09-15 DIAGNOSIS — Z299 Encounter for prophylactic measures, unspecified: Secondary | ICD-10-CM | POA: Diagnosis not present

## 2020-09-15 DIAGNOSIS — E1165 Type 2 diabetes mellitus with hyperglycemia: Secondary | ICD-10-CM | POA: Diagnosis not present

## 2020-10-13 DIAGNOSIS — M069 Rheumatoid arthritis, unspecified: Secondary | ICD-10-CM | POA: Diagnosis not present

## 2020-10-13 DIAGNOSIS — M461 Sacroiliitis, not elsewhere classified: Secondary | ICD-10-CM | POA: Diagnosis not present

## 2020-10-13 DIAGNOSIS — E1165 Type 2 diabetes mellitus with hyperglycemia: Secondary | ICD-10-CM | POA: Diagnosis not present

## 2020-10-13 DIAGNOSIS — Z299 Encounter for prophylactic measures, unspecified: Secondary | ICD-10-CM | POA: Diagnosis not present

## 2020-11-06 DIAGNOSIS — E1165 Type 2 diabetes mellitus with hyperglycemia: Secondary | ICD-10-CM | POA: Diagnosis not present

## 2020-11-06 DIAGNOSIS — M1380 Other specified arthritis, unspecified site: Secondary | ICD-10-CM | POA: Diagnosis not present

## 2020-11-06 DIAGNOSIS — Z299 Encounter for prophylactic measures, unspecified: Secondary | ICD-10-CM | POA: Diagnosis not present

## 2020-11-06 DIAGNOSIS — R5383 Other fatigue: Secondary | ICD-10-CM | POA: Diagnosis not present

## 2020-11-14 DIAGNOSIS — E11649 Type 2 diabetes mellitus with hypoglycemia without coma: Secondary | ICD-10-CM | POA: Diagnosis not present

## 2020-11-17 DIAGNOSIS — Z299 Encounter for prophylactic measures, unspecified: Secondary | ICD-10-CM | POA: Diagnosis not present

## 2020-11-17 DIAGNOSIS — M069 Rheumatoid arthritis, unspecified: Secondary | ICD-10-CM | POA: Diagnosis not present

## 2020-11-17 DIAGNOSIS — M109 Gout, unspecified: Secondary | ICD-10-CM | POA: Diagnosis not present

## 2020-11-17 DIAGNOSIS — G8929 Other chronic pain: Secondary | ICD-10-CM | POA: Diagnosis not present

## 2020-11-27 DIAGNOSIS — Z299 Encounter for prophylactic measures, unspecified: Secondary | ICD-10-CM | POA: Diagnosis not present

## 2020-11-27 DIAGNOSIS — J069 Acute upper respiratory infection, unspecified: Secondary | ICD-10-CM | POA: Diagnosis not present

## 2020-12-04 DIAGNOSIS — E1165 Type 2 diabetes mellitus with hyperglycemia: Secondary | ICD-10-CM | POA: Diagnosis not present

## 2020-12-04 DIAGNOSIS — M069 Rheumatoid arthritis, unspecified: Secondary | ICD-10-CM | POA: Diagnosis not present

## 2020-12-04 DIAGNOSIS — J069 Acute upper respiratory infection, unspecified: Secondary | ICD-10-CM | POA: Diagnosis not present

## 2020-12-04 DIAGNOSIS — Z299 Encounter for prophylactic measures, unspecified: Secondary | ICD-10-CM | POA: Diagnosis not present

## 2020-12-14 DIAGNOSIS — E11649 Type 2 diabetes mellitus with hypoglycemia without coma: Secondary | ICD-10-CM | POA: Diagnosis not present

## 2020-12-15 DIAGNOSIS — Z79899 Other long term (current) drug therapy: Secondary | ICD-10-CM | POA: Diagnosis not present

## 2020-12-15 DIAGNOSIS — M069 Rheumatoid arthritis, unspecified: Secondary | ICD-10-CM | POA: Diagnosis not present

## 2020-12-15 DIAGNOSIS — R5383 Other fatigue: Secondary | ICD-10-CM | POA: Diagnosis not present

## 2020-12-15 DIAGNOSIS — M1380 Other specified arthritis, unspecified site: Secondary | ICD-10-CM | POA: Diagnosis not present

## 2020-12-16 DIAGNOSIS — E1165 Type 2 diabetes mellitus with hyperglycemia: Secondary | ICD-10-CM | POA: Diagnosis not present

## 2020-12-16 DIAGNOSIS — R609 Edema, unspecified: Secondary | ICD-10-CM | POA: Diagnosis not present

## 2020-12-16 DIAGNOSIS — Z299 Encounter for prophylactic measures, unspecified: Secondary | ICD-10-CM | POA: Diagnosis not present

## 2020-12-28 DIAGNOSIS — M069 Rheumatoid arthritis, unspecified: Secondary | ICD-10-CM | POA: Diagnosis not present

## 2020-12-28 DIAGNOSIS — E1165 Type 2 diabetes mellitus with hyperglycemia: Secondary | ICD-10-CM | POA: Diagnosis not present

## 2020-12-28 DIAGNOSIS — Z299 Encounter for prophylactic measures, unspecified: Secondary | ICD-10-CM | POA: Diagnosis not present

## 2021-01-13 DIAGNOSIS — E119 Type 2 diabetes mellitus without complications: Secondary | ICD-10-CM | POA: Diagnosis not present

## 2021-01-13 DIAGNOSIS — Z794 Long term (current) use of insulin: Secondary | ICD-10-CM | POA: Diagnosis not present

## 2021-01-14 ENCOUNTER — Other Ambulatory Visit: Payer: Self-pay | Admitting: Internal Medicine

## 2021-01-14 DIAGNOSIS — Z139 Encounter for screening, unspecified: Secondary | ICD-10-CM

## 2021-01-14 DIAGNOSIS — E11649 Type 2 diabetes mellitus with hypoglycemia without coma: Secondary | ICD-10-CM | POA: Diagnosis not present

## 2021-01-15 ENCOUNTER — Ambulatory Visit
Admission: RE | Admit: 2021-01-15 | Discharge: 2021-01-15 | Disposition: A | Discharge status: Home / Self Care | Payer: Medicare Other | Source: Ambulatory Visit | Attending: Internal Medicine | Admitting: Internal Medicine

## 2021-01-15 ENCOUNTER — Other Ambulatory Visit: Payer: Self-pay

## 2021-01-15 DIAGNOSIS — Z139 Encounter for screening, unspecified: Secondary | ICD-10-CM

## 2021-01-15 DIAGNOSIS — Z1231 Encounter for screening mammogram for malignant neoplasm of breast: Secondary | ICD-10-CM | POA: Diagnosis not present

## 2021-01-26 DIAGNOSIS — Z7189 Other specified counseling: Secondary | ICD-10-CM | POA: Diagnosis not present

## 2021-01-26 DIAGNOSIS — D23121 Other benign neoplasm of skin of left upper eyelid, including canthus: Secondary | ICD-10-CM | POA: Diagnosis not present

## 2021-01-26 DIAGNOSIS — Z299 Encounter for prophylactic measures, unspecified: Secondary | ICD-10-CM | POA: Diagnosis not present

## 2021-01-26 DIAGNOSIS — Z0000 Encounter for general adult medical examination without abnormal findings: Secondary | ICD-10-CM | POA: Diagnosis not present

## 2021-01-26 DIAGNOSIS — D23111 Other benign neoplasm of skin of right upper eyelid, including canthus: Secondary | ICD-10-CM | POA: Diagnosis not present

## 2021-01-26 DIAGNOSIS — G4700 Insomnia, unspecified: Secondary | ICD-10-CM | POA: Diagnosis not present

## 2021-01-29 DIAGNOSIS — Z299 Encounter for prophylactic measures, unspecified: Secondary | ICD-10-CM | POA: Diagnosis not present

## 2021-01-29 DIAGNOSIS — M542 Cervicalgia: Secondary | ICD-10-CM | POA: Diagnosis not present

## 2021-01-29 DIAGNOSIS — E7800 Pure hypercholesterolemia, unspecified: Secondary | ICD-10-CM | POA: Diagnosis not present

## 2021-01-29 DIAGNOSIS — R5383 Other fatigue: Secondary | ICD-10-CM | POA: Diagnosis not present

## 2021-01-29 DIAGNOSIS — M069 Rheumatoid arthritis, unspecified: Secondary | ICD-10-CM | POA: Diagnosis not present

## 2021-01-29 DIAGNOSIS — M791 Myalgia, unspecified site: Secondary | ICD-10-CM | POA: Diagnosis not present

## 2021-01-29 DIAGNOSIS — M25572 Pain in left ankle and joints of left foot: Secondary | ICD-10-CM | POA: Diagnosis not present

## 2021-02-12 DIAGNOSIS — Z299 Encounter for prophylactic measures, unspecified: Secondary | ICD-10-CM | POA: Diagnosis not present

## 2021-02-12 DIAGNOSIS — E1165 Type 2 diabetes mellitus with hyperglycemia: Secondary | ICD-10-CM | POA: Diagnosis not present

## 2021-02-12 DIAGNOSIS — E11649 Type 2 diabetes mellitus with hypoglycemia without coma: Secondary | ICD-10-CM | POA: Diagnosis not present

## 2021-02-26 DIAGNOSIS — Z7189 Other specified counseling: Secondary | ICD-10-CM | POA: Diagnosis not present

## 2021-02-26 DIAGNOSIS — M0609 Rheumatoid arthritis without rheumatoid factor, multiple sites: Secondary | ICD-10-CM | POA: Diagnosis not present

## 2021-02-26 DIAGNOSIS — Z299 Encounter for prophylactic measures, unspecified: Secondary | ICD-10-CM | POA: Diagnosis not present

## 2021-02-26 DIAGNOSIS — R52 Pain, unspecified: Secondary | ICD-10-CM | POA: Diagnosis not present

## 2021-02-26 DIAGNOSIS — Z789 Other specified health status: Secondary | ICD-10-CM | POA: Diagnosis not present

## 2021-02-26 DIAGNOSIS — Z0000 Encounter for general adult medical examination without abnormal findings: Secondary | ICD-10-CM | POA: Diagnosis not present

## 2021-02-26 DIAGNOSIS — Z713 Dietary counseling and surveillance: Secondary | ICD-10-CM | POA: Diagnosis not present

## 2021-03-08 DIAGNOSIS — M069 Rheumatoid arthritis, unspecified: Secondary | ICD-10-CM | POA: Diagnosis not present

## 2021-03-08 DIAGNOSIS — Z299 Encounter for prophylactic measures, unspecified: Secondary | ICD-10-CM | POA: Diagnosis not present

## 2021-03-08 DIAGNOSIS — Z713 Dietary counseling and surveillance: Secondary | ICD-10-CM | POA: Diagnosis not present

## 2021-03-17 DIAGNOSIS — E11649 Type 2 diabetes mellitus with hypoglycemia without coma: Secondary | ICD-10-CM | POA: Diagnosis not present

## 2021-03-18 DIAGNOSIS — M2550 Pain in unspecified joint: Secondary | ICD-10-CM | POA: Diagnosis not present

## 2021-03-18 DIAGNOSIS — Z79899 Other long term (current) drug therapy: Secondary | ICD-10-CM | POA: Diagnosis not present

## 2021-03-18 DIAGNOSIS — M1380 Other specified arthritis, unspecified site: Secondary | ICD-10-CM | POA: Diagnosis not present

## 2021-03-26 DIAGNOSIS — M79671 Pain in right foot: Secondary | ICD-10-CM | POA: Diagnosis not present

## 2021-03-26 DIAGNOSIS — S90452A Superficial foreign body, left great toe, initial encounter: Secondary | ICD-10-CM | POA: Diagnosis not present

## 2021-03-26 DIAGNOSIS — M1380 Other specified arthritis, unspecified site: Secondary | ICD-10-CM | POA: Diagnosis not present

## 2021-03-26 DIAGNOSIS — R600 Localized edema: Secondary | ICD-10-CM | POA: Diagnosis not present

## 2021-03-26 DIAGNOSIS — Z299 Encounter for prophylactic measures, unspecified: Secondary | ICD-10-CM | POA: Diagnosis not present

## 2021-03-26 DIAGNOSIS — Z79899 Other long term (current) drug therapy: Secondary | ICD-10-CM | POA: Diagnosis not present

## 2021-03-26 DIAGNOSIS — M79672 Pain in left foot: Secondary | ICD-10-CM | POA: Diagnosis not present

## 2021-03-26 DIAGNOSIS — M25561 Pain in right knee: Secondary | ICD-10-CM | POA: Diagnosis not present

## 2021-03-30 DIAGNOSIS — M795 Residual foreign body in soft tissue: Secondary | ICD-10-CM | POA: Diagnosis not present

## 2021-03-30 DIAGNOSIS — M79672 Pain in left foot: Secondary | ICD-10-CM | POA: Diagnosis not present

## 2021-03-30 DIAGNOSIS — S99922A Unspecified injury of left foot, initial encounter: Secondary | ICD-10-CM | POA: Diagnosis not present

## 2021-04-12 ENCOUNTER — Ambulatory Visit (INDEPENDENT_AMBULATORY_CARE_PROVIDER_SITE_OTHER): Payer: Medicare Other

## 2021-04-12 ENCOUNTER — Other Ambulatory Visit: Payer: Self-pay

## 2021-04-12 ENCOUNTER — Ambulatory Visit (INDEPENDENT_AMBULATORY_CARE_PROVIDER_SITE_OTHER): Payer: Medicare Other | Admitting: Podiatry

## 2021-04-12 DIAGNOSIS — S90852A Superficial foreign body, left foot, initial encounter: Secondary | ICD-10-CM | POA: Diagnosis not present

## 2021-04-12 DIAGNOSIS — W25XXXA Contact with sharp glass, initial encounter: Secondary | ICD-10-CM

## 2021-04-12 DIAGNOSIS — S9032XA Contusion of left foot, initial encounter: Secondary | ICD-10-CM | POA: Diagnosis not present

## 2021-04-12 DIAGNOSIS — M79672 Pain in left foot: Secondary | ICD-10-CM

## 2021-04-12 MED ORDER — DOXYCYCLINE HYCLATE 100 MG PO TABS: 100.0000 mg | ORAL_TABLET | Freq: Two times a day (BID) | ORAL | 0 refills | Status: DC

## 2021-04-14 DIAGNOSIS — E119 Type 2 diabetes mellitus without complications: Secondary | ICD-10-CM | POA: Diagnosis not present

## 2021-04-14 DIAGNOSIS — Z794 Long term (current) use of insulin: Secondary | ICD-10-CM | POA: Diagnosis not present

## 2021-04-16 NOTE — Progress Notes (Signed)
Subjective:   Patient ID: Benjie Karvonen, female   DOB: 50 y.o.   MRN: [id number]   HPI 50 year old female presents the office today for concerns possible foreign body to her left foot.  She said she stepped on a piece of glass which she did take out on her own and she is been soaking her foot.  She did follow-up with another podiatrist as well and had x-rays performed which they thought they could see some but apparently the x-ray.  She presents today for further evaluation.  She has not seen any swelling or redness or any drainage.  She has noticed some thicker skin along the area where the glass entered.  She tries to trim this down on her own.  She still gets tenderness.   Review of Systems  All other systems reviewed and are negative.  Past Medical History:  Diagnosis Date   Anemia    Anxiety    Arthritis    orthopedic   Bipolar disorder ([facility])    Depression    Fibromyalgia    GERD (gastroesophageal reflux disease)    Hypotension    IBS (irritable bowel syndrome)    Migraine    PTSD (post-traumatic stress disorder)    on prazosin   Sleep apnea    Tonsillar hypertrophy    Uterine fibroid     Past Surgical History:  Procedure Laterality Date   ENDOMETRIAL ABLATION     TONSILLECTOMY AND ADENOIDECTOMY N/A 02/13/2018   Procedure: TONSILLECTOMY AND ADENOIDECTOMY;  Surgeon: Leta Baptist, MD;  Location: [facility];  Service: ENT;  Laterality: N/A;   TUBAL LIGATION       Current Outpatient Medications:    doxycycline (VIBRA-TABS) 100 MG tablet, Take 1 tablet (100 mg total) by mouth 2 (two) times daily., Disp: 14 tablet, Rfl: 0   ALPRAZolam (XANAX) 0.5 MG tablet, Take 0.5 mg by mouth 2 (two) times daily as needed., Disp: , Rfl:    alprazolam (XANAX) 2 MG tablet, Take 1 tablet (2 mg total) by mouth 3 (three) times daily as needed., Disp: 90 tablet, Rfl: 2   B Complex-C-E-Zn (B COMPLEX-C-E-ZINC) tablet, Take 1 tablet by mouth daily., Disp: , Rfl:    BELSOMRA 20 MG  TABS, Take 1 tablet by mouth at bedtime as needed., Disp: , Rfl:    diclofenac Sodium (VOLTAREN) 1 % GEL, Apply 4 g topically 4 (four) times daily., Disp: , Rfl:    DULoxetine (CYMBALTA) 60 MG capsule, Take 60 mg by mouth daily., Disp: , Rfl:    gabapentin (NEURONTIN) 600 MG tablet, Take 1 tablet by mouth daily. For migraines, Disp: , Rfl:    LATUDA 40 MG TABS tablet, Take 40 mg by mouth at bedtime., Disp: , Rfl:    norethindrone (MICRONOR) 0.35 MG tablet, Take 1 tablet (0.35 mg total) by mouth daily., Disp: 1 Package, Rfl: 11   Oxcarbazepine (TRILEPTAL) 300 MG tablet, Take 1 tablet (300 mg total) by mouth 2 (two) times daily. (Patient taking differently: Take 600 mg by mouth 2 (two) times daily. ), Disp: 60 tablet, Rfl: 2   pregabalin (LYRICA) 75 MG capsule, Take 1 capsule by mouth daily., Disp: , Rfl:    vitamin B-12 (CYANOCOBALAMIN) 100 MCG tablet, Take 100 mcg by mouth daily., Disp: , Rfl:   Allergies  Allergen Reactions   Peanut-Containing Drug Products Anaphylaxis    Swelling    Tegretol [Carbamazepine] Shortness Of Breath and Swelling   Lithium Other (See Comments)  Bad acne    Sweet Potato Itching   Penicillins Rash          Objective:  Physical Exam  General: AAO x3, NAD  Dermatological: The plantar aspect of the left foot on the heels hyperkeratotic lesion.  Upon debridement unable to identify any puncture wound.  There is trace edema but there is no erythema or warmth.  There is no fluctuance or crepitation.  There is no malodor.  Mild tenderness to palpation.  Vascular: Dorsalis Pedis artery and Posterior Tibial artery pedal pulses are 2/4 bilateral with immedate capillary fill time. There is no pain with calf compression, swelling, warmth, erythema.   Neruologic: Grossly intact via light touch bilateral.   Musculoskeletal: Mild to palpation of the hyperkeratotic lesion.  No other areas of discomfort.  Muscular strength 5/5 in all groups tested bilateral.  Gait:  Unassisted, Nonantalgic.       Assessment:   Foreign body left foot     Plan:  -Treatment options discussed including all alternatives, risks, and complications -Etiology of symptoms were discussed -X-rays were obtained and reviewed with the patient.  Not able to identify any evidence of foreign body. -Given the continued pain, ordered ultrasound to rule out any underlying foreign body.  This was ordered for [facility].  Given the slight edema although there is no other signs of infection absolutely start doxycycline was ordered.  Offloading pads were dispensed.  Monitor closely for signs or symptoms of infection to me know immediately should any occur.  [name] DPM

## 2021-05-03 ENCOUNTER — Ambulatory Visit: Payer: Medicare Other | Admitting: Podiatry

## 2021-05-03 IMAGING — CT CT ABD-PELV W/ CM
2 of 5 series · 16 of 46 positions shown, 18 images · IV contrast (omnipaque)
Comparison: None.

CLINICAL DATA: Lower abdominal pain for 1 week

EXAM:
CT ABDOMEN AND PELVIS WITH CONTRAST
TECHNIQUE: Multidetector CT imaging of the abdomen and pelvis was performed
using the standard protocol following bolus administration of
intravenous contrast.
CONTRAST:  100mL OMNIPAQUE IOHEXOL 300 MG/ML  SOLN

[Series 2: axial st · axial · 0.75mm/px · z∈[+840,+1260]mm · 13 of 94 slices shown, 15 images]
[im 5/94  soft-tissue]
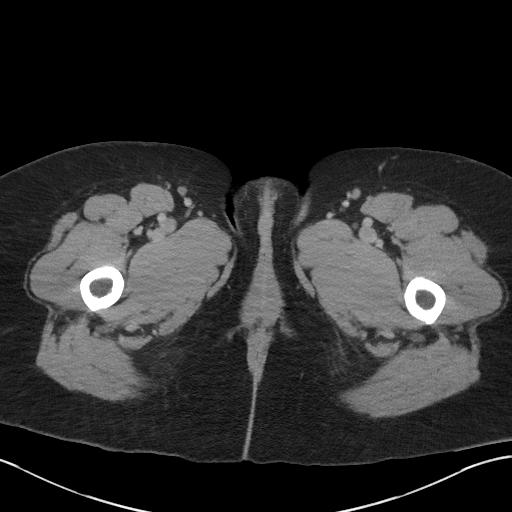
[im 5/94  bone]
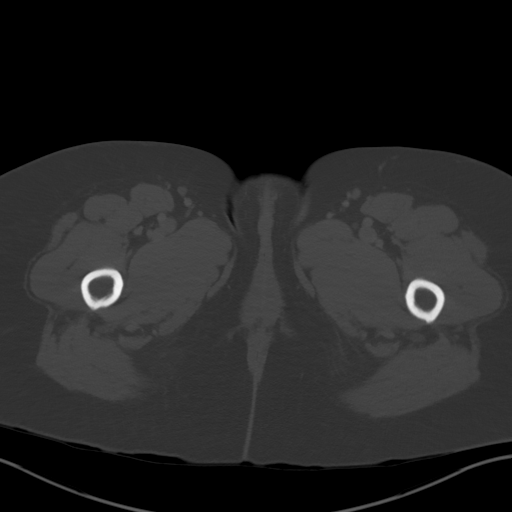
[im 15/94  soft-tissue]
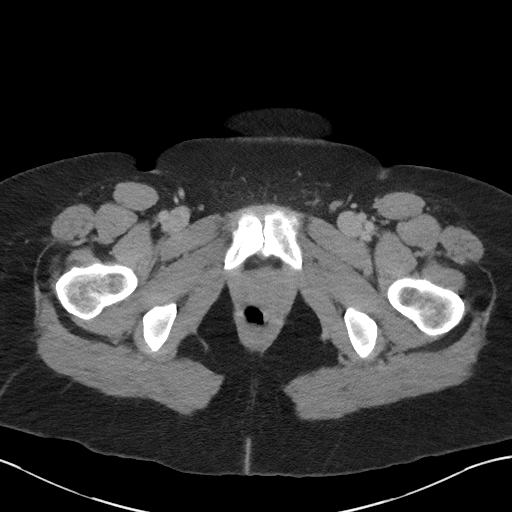
[im 20/94  soft-tissue]
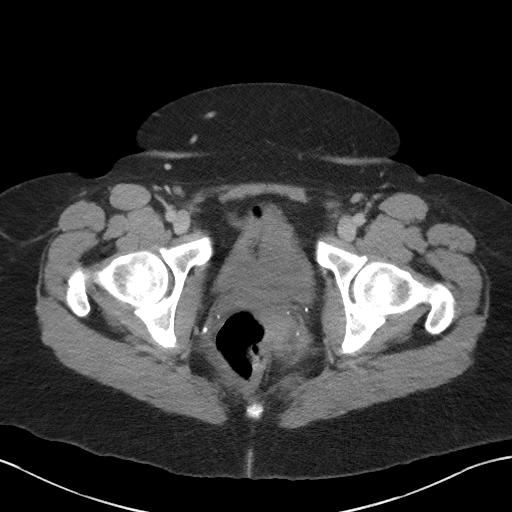
[im 25/94  soft-tissue]
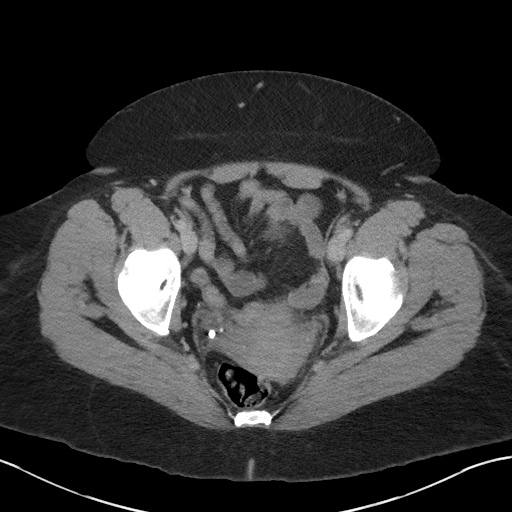
[im 35/94  soft-tissue]
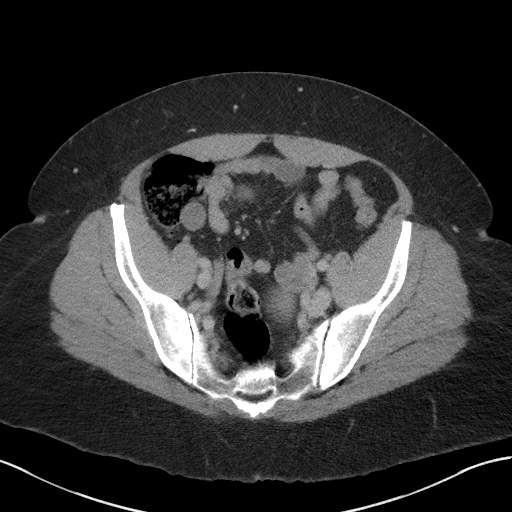
[im 40/94  soft-tissue]
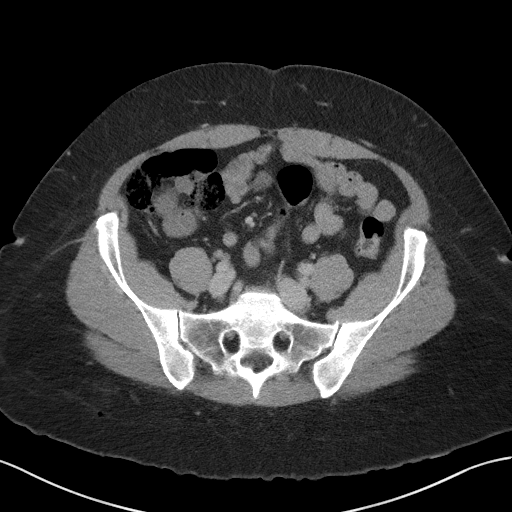
[im 49/94  soft-tissue]
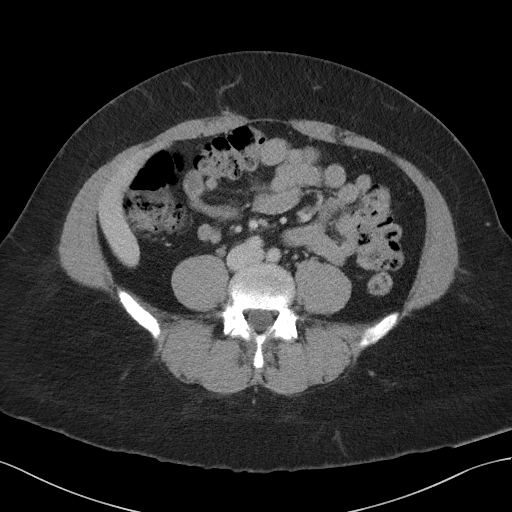
[im 54/94  soft-tissue]
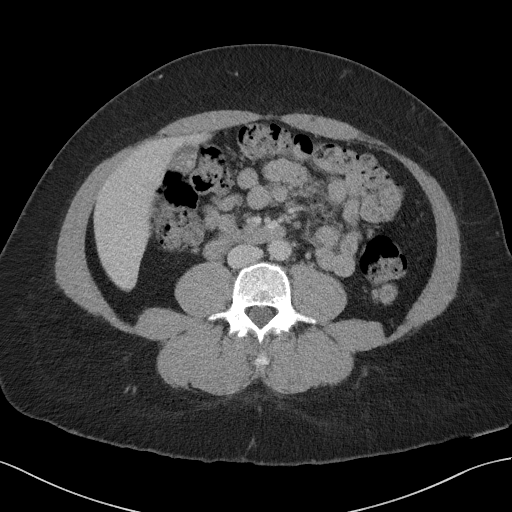
[im 59/94  soft-tissue]
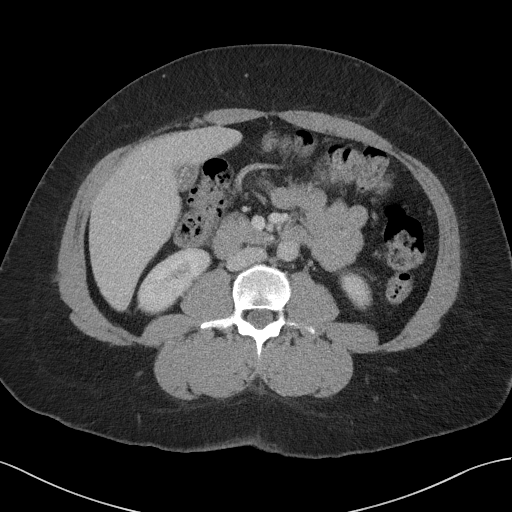
[im 59/94  bone]
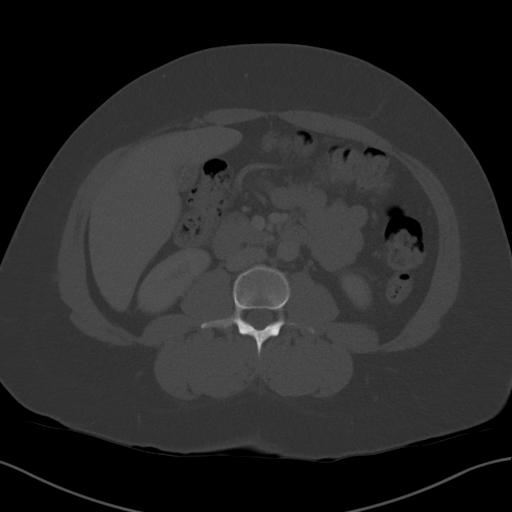
[im 69/94  soft-tissue]
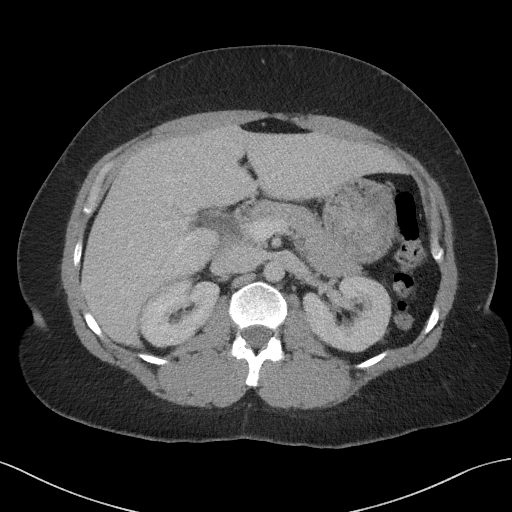
[im 74/94  soft-tissue]
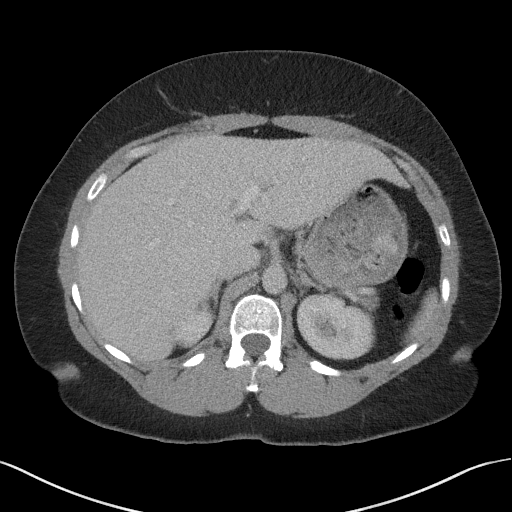
[im 79/94  soft-tissue]
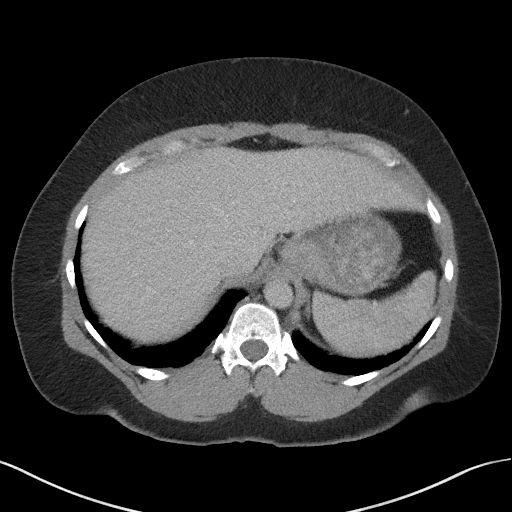
[im 89/94  soft-tissue]
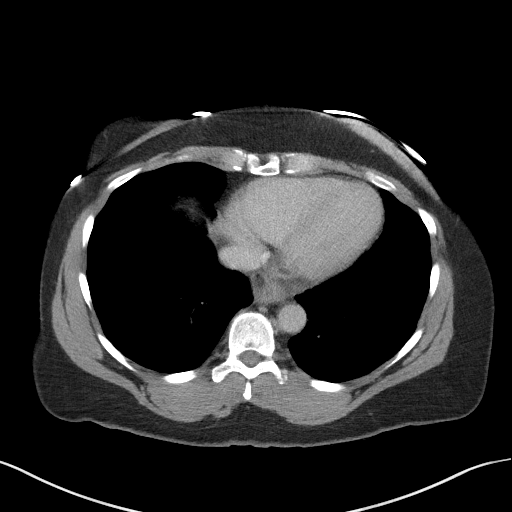

[Series 6: coronal st · coronal · 0.82mm/px · 3 of 117 slices shown]
[im 39/117  soft-tissue]
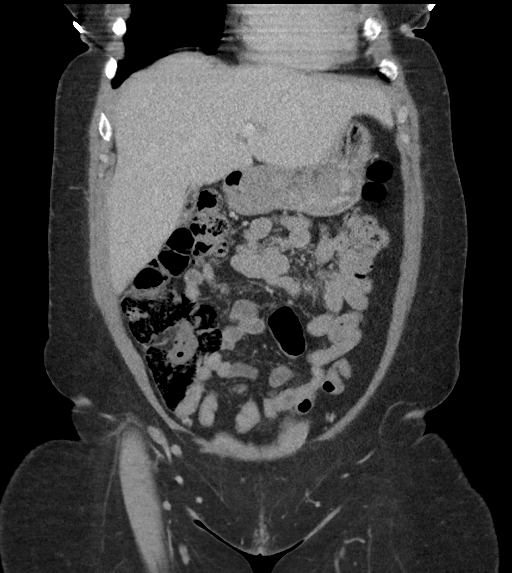
[im 52/117  soft-tissue]
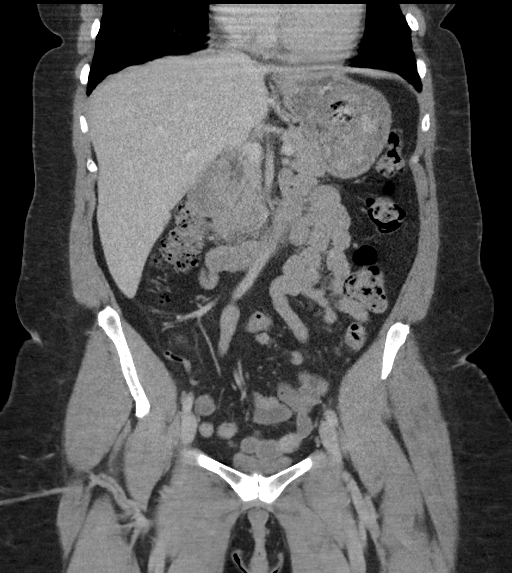
[im 65/117  soft-tissue]
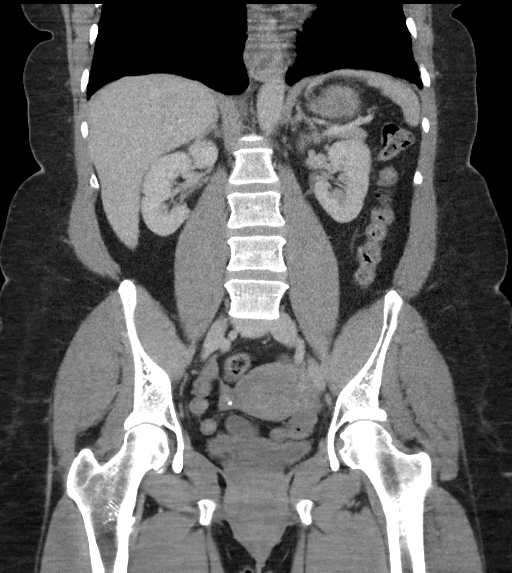

[16 of 46 positions shown; findings below may reference images not displayed]

FINDINGS: Lower chest: No acute abnormality.

Hepatobiliary: Gallbladder is decompressed with multiple gallstones
within. The liver is within normal limits.

Pancreas: Unremarkable. No pancreatic ductal dilatation or
surrounding inflammatory changes.

Spleen: Normal in size without focal abnormality.

Adrenals/Urinary Tract: Adrenal glands are unremarkable. Kidneys
demonstrate a normal enhancement pattern bilaterally. No renal
calculi are seen. Normal excretion of contrast is noted. Bladder is
decompressed.

Stomach/Bowel: The appendix is within normal limits. No obstructive
or inflammatory changes of the colon are seen. Small bowel and
stomach are unremarkable with the exception of a small sliding-type
hiatal hernia.

Vascular/Lymphatic: No significant vascular findings are present. No
enlarged abdominal or pelvic lymph nodes.

Reproductive: Uterus and bilateral adnexa are unremarkable. Ovarian
cystic changes noted without complicating factors.

Other: No abdominal wall hernia or abnormality. No abdominopelvic
ascites.

Musculoskeletal: No acute or significant osseous findings.
IMPRESSION: Sliding-type hiatal hernia.

Cholelithiasis without complicating factors.

Ovarian cystic changes.

## 2021-05-17 DIAGNOSIS — I10 Essential (primary) hypertension: Secondary | ICD-10-CM | POA: Diagnosis not present

## 2021-05-17 DIAGNOSIS — E7800 Pure hypercholesterolemia, unspecified: Secondary | ICD-10-CM | POA: Diagnosis not present

## 2021-05-26 DIAGNOSIS — M069 Rheumatoid arthritis, unspecified: Secondary | ICD-10-CM | POA: Diagnosis not present

## 2021-05-26 DIAGNOSIS — J069 Acute upper respiratory infection, unspecified: Secondary | ICD-10-CM | POA: Diagnosis not present

## 2021-05-26 DIAGNOSIS — Z789 Other specified health status: Secondary | ICD-10-CM | POA: Diagnosis not present

## 2021-05-26 DIAGNOSIS — J358 Other chronic diseases of tonsils and adenoids: Secondary | ICD-10-CM | POA: Diagnosis not present

## 2021-05-26 DIAGNOSIS — E1165 Type 2 diabetes mellitus with hyperglycemia: Secondary | ICD-10-CM | POA: Diagnosis not present

## 2021-05-26 DIAGNOSIS — Z299 Encounter for prophylactic measures, unspecified: Secondary | ICD-10-CM | POA: Diagnosis not present

## 2021-06-21 DIAGNOSIS — R5383 Other fatigue: Secondary | ICD-10-CM | POA: Diagnosis not present

## 2021-06-21 DIAGNOSIS — Z299 Encounter for prophylactic measures, unspecified: Secondary | ICD-10-CM | POA: Diagnosis not present

## 2021-06-21 DIAGNOSIS — Z789 Other specified health status: Secondary | ICD-10-CM | POA: Diagnosis not present

## 2021-06-21 DIAGNOSIS — E1165 Type 2 diabetes mellitus with hyperglycemia: Secondary | ICD-10-CM | POA: Diagnosis not present

## 2021-06-21 DIAGNOSIS — M1380 Other specified arthritis, unspecified site: Secondary | ICD-10-CM | POA: Diagnosis not present

## 2021-06-21 DIAGNOSIS — M797 Fibromyalgia: Secondary | ICD-10-CM | POA: Diagnosis not present

## 2021-06-21 DIAGNOSIS — M461 Sacroiliitis, not elsewhere classified: Secondary | ICD-10-CM | POA: Diagnosis not present

## 2021-07-02 DIAGNOSIS — M62838 Other muscle spasm: Secondary | ICD-10-CM | POA: Diagnosis not present

## 2021-07-02 DIAGNOSIS — R5383 Other fatigue: Secondary | ICD-10-CM | POA: Diagnosis not present

## 2021-07-02 DIAGNOSIS — R109 Unspecified abdominal pain: Secondary | ICD-10-CM | POA: Diagnosis not present

## 2021-07-02 DIAGNOSIS — Z299 Encounter for prophylactic measures, unspecified: Secondary | ICD-10-CM | POA: Diagnosis not present

## 2021-07-13 DIAGNOSIS — Z794 Long term (current) use of insulin: Secondary | ICD-10-CM | POA: Diagnosis not present

## 2021-07-13 DIAGNOSIS — E119 Type 2 diabetes mellitus without complications: Secondary | ICD-10-CM | POA: Diagnosis not present

## 2021-07-21 ENCOUNTER — Emergency Department (HOSPITAL_COMMUNITY)
Admission: EM | Admit: 2021-07-21 | Discharge: 2021-07-22 | Disposition: A | Discharge status: Home / Self Care | Payer: Medicare Other | Attending: Emergency Medicine | Admitting: Emergency Medicine

## 2021-07-21 ENCOUNTER — Encounter (HOSPITAL_COMMUNITY): Payer: Self-pay

## 2021-07-21 ENCOUNTER — Emergency Department (HOSPITAL_COMMUNITY): Payer: Medicare Other

## 2021-07-21 ENCOUNTER — Other Ambulatory Visit: Payer: Self-pay

## 2021-07-21 DIAGNOSIS — R509 Fever, unspecified: Secondary | ICD-10-CM | POA: Diagnosis present

## 2021-07-21 DIAGNOSIS — Z20822 Contact with and (suspected) exposure to covid-19: Secondary | ICD-10-CM | POA: Insufficient documentation

## 2021-07-21 DIAGNOSIS — R000 Tachycardia, unspecified: Secondary | ICD-10-CM | POA: Diagnosis not present

## 2021-07-21 DIAGNOSIS — J111 Influenza due to unidentified influenza virus with other respiratory manifestations: Secondary | ICD-10-CM

## 2021-07-21 DIAGNOSIS — R059 Cough, unspecified: Secondary | ICD-10-CM | POA: Diagnosis not present

## 2021-07-21 DIAGNOSIS — J069 Acute upper respiratory infection, unspecified: Secondary | ICD-10-CM | POA: Diagnosis not present

## 2021-07-21 DIAGNOSIS — Z91010 Allergy to peanuts: Secondary | ICD-10-CM | POA: Diagnosis not present

## 2021-07-21 DIAGNOSIS — Z299 Encounter for prophylactic measures, unspecified: Secondary | ICD-10-CM | POA: Diagnosis not present

## 2021-07-21 DIAGNOSIS — J101 Influenza due to other identified influenza virus with other respiratory manifestations: Secondary | ICD-10-CM | POA: Insufficient documentation

## 2021-07-21 DIAGNOSIS — M069 Rheumatoid arthritis, unspecified: Secondary | ICD-10-CM | POA: Diagnosis not present

## 2021-07-21 DIAGNOSIS — R0602 Shortness of breath: Secondary | ICD-10-CM | POA: Diagnosis not present

## 2021-07-21 LAB — RESP PANEL BY RT-PCR (FLU A&B, COVID) ARPGX2
Influenza A by PCR: POSITIVE — AB
Influenza B by PCR: NEGATIVE
SARS Coronavirus 2 by RT PCR: NEGATIVE

## 2021-07-21 MED ORDER — SODIUM CHLORIDE 0.9 % IV BOLUS
1000.0000 mL | Freq: Once | INTRAVENOUS | Status: AC
  Administered 2021-07-22: 1000 mL via INTRAVENOUS

## 2021-07-21 MED ORDER — ACETAMINOPHEN 500 MG PO TABS
1000.0000 mg | ORAL_TABLET | Freq: Once | ORAL | Status: AC
  Administered 2021-07-21: 1000 mg via ORAL
  Filled 2021-07-21: qty 2

## 2021-07-21 MED ORDER — IBUPROFEN 400 MG PO TABS
400.0000 mg | ORAL_TABLET | Freq: Once | ORAL | Status: AC
  Administered 2021-07-22: 400 mg via ORAL
  Filled 2021-07-21: qty 1

## 2021-07-21 MED ORDER — ONDANSETRON HCL 4 MG/2ML IJ SOLN
4.0000 mg | Freq: Once | INTRAMUSCULAR | Status: AC
  Administered 2021-07-22: 4 mg via INTRAVENOUS
  Filled 2021-07-21: qty 2

## 2021-07-21 NOTE — ED Provider Notes (Signed)
[facility] EMERGENCY DEPARTMENT Provider Note   CSN: [id number] Arrival date & time: 07/21/21  2208     History  Chief Complaint  Patient presents with   Flu Like Symptoms     [name] is a 51 y.o. female.  Patient presents with fever, chills, headache, congestion, sore throat and cough for 3 days.  She reports some shortness of breath that improved when she took an albuterol inhaler.  She has had nausea but no vomiting.      Home Medications Prior to Admission medications   Medication Sig Start Date End Date Taking? Authorizing Provider  oseltamivir (TAMIFLU) 75 MG capsule Take 1 capsule (75 mg total) by mouth every 12 (twelve) hours. 07/22/21  Yes Ajax Schroll, Gwenyth Allegra, MD  ALPRAZolam [name]) 0.5 MG tablet Take 0.5 mg by mouth 2 (two) times daily as needed. 06/29/19   [provider]  alprazolam [name]) 2 MG tablet Take 1 tablet (2 mg total) by mouth 3 (three) times daily as needed. 11/07/13   [name], MD  B Complex-C-E-Zn (B COMPLEX-C-E-ZINC) tablet Take 1 tablet by mouth daily.    [provider]  BELSOMRA 20 MG TABS Take 1 tablet by mouth at bedtime as needed. 06/29/19   [provider]  diclofenac Sodium (VOLTAREN) 1 % GEL Apply 4 g topically 4 (four) times daily.    [provider]  doxycycline (VIBRA-TABS) 100 MG tablet Take 1 tablet (100 mg total) by mouth 2 (two) times daily. 04/12/21   [name], DPM  DULoxetine (CYMBALTA) 60 MG capsule Take 60 mg by mouth daily. 06/29/19   [provider]  gabapentin (NEURONTIN) 600 MG tablet Take 1 tablet by mouth daily. For migraines 06/29/19   [provider]  LATUDA 40 MG TABS tablet Take 40 mg by mouth at bedtime. 06/29/19   [provider]  norethindrone (MICRONOR) 0.35 MG tablet Take 1 tablet (0.35 mg total) by mouth daily. 05/06/19   [name], MD  Oxcarbazepine (TRILEPTAL) 300 MG tablet Take 1 tablet (300 mg total) by mouth 2 (two)  times daily. Patient taking differently: Take 600 mg by mouth 2 (two) times daily.  11/07/13   [name], MD  pregabalin (LYRICA) 75 MG capsule Take 1 capsule by mouth daily. 06/29/19   [provider]  vitamin B-12 (CYANOCOBALAMIN) 100 MCG tablet Take 100 mcg by mouth daily.    [provider]      Allergies    Peanut-containing drug products, Tegretol [carbamazepine], Lithium, Sweet potato, and Penicillins    Review of Systems   Review of Systems  Physical Exam Updated Vital Signs BP 135/78 (BP Location: Left Arm)    Pulse (!) 124    Temp (!) 102.9 F (39.4 C) (Oral)    Resp 20    Ht 5\' 7"  (1.702 m)    Wt 112.9 kg    SpO2 99%    BMI 39.00 kg/m  Physical Exam Vitals and nursing note reviewed.  Constitutional:      General: She is not in acute distress.    Appearance: Normal appearance. She is well-developed.  HENT:     Head: Normocephalic and atraumatic.     Right Ear: Hearing normal.     Left Ear: Hearing normal.     Nose: Nose normal.  Eyes:     Conjunctiva/sclera: Conjunctivae normal.     Pupils: Pupils are equal, round, and reactive to light.  Cardiovascular:  Rate and Rhythm: Regular rhythm. Tachycardia present.     Heart sounds: S1 normal and S2 normal. No murmur heard.   No friction rub. No gallop.  Pulmonary:     Effort: Pulmonary effort is normal. No respiratory distress.     Breath sounds: Normal breath sounds.  Chest:     Chest wall: No tenderness.  Abdominal:     General: Bowel sounds are normal.     Palpations: Abdomen is soft.     Tenderness: There is no abdominal tenderness. There is no guarding or rebound. Negative signs include Murphy's sign and McBurney's sign.     Hernia: No hernia is present.  Musculoskeletal:        General: Normal range of motion.     Cervical back: Normal range of motion and neck supple.  Skin:    General: Skin is warm and dry.     Findings: No rash.  Neurological:     Mental Status: She is alert and  oriented to person, place, and time.     GCS: GCS eye subscore is 4. GCS verbal subscore is 5. GCS motor subscore is 6.     Cranial Nerves: No cranial nerve deficit.     Sensory: No sensory deficit.     Coordination: Coordination normal.  Psychiatric:        Speech: Speech normal.        Behavior: Behavior normal.        Thought Content: Thought content normal.    ED Results / Procedures / Treatments   Labs (all labs ordered are listed, but only abnormal results are displayed) Labs Reviewed  RESP PANEL BY RT-PCR (FLU A&B, COVID) ARPGX2 - Abnormal; Notable for the following components:      Result Value   Influenza A by PCR POSITIVE (*)    All other components within normal limits  COMPREHENSIVE METABOLIC PANEL - Abnormal; Notable for the following components:   Sodium 132 (*)    Glucose, Bld 152 (*)    Albumin 3.4 (*)    Total Bilirubin 0.2 (*)    All other components within normal limits  CBC WITH DIFFERENTIAL/PLATELET - Abnormal; Notable for the following components:   Lymphs Abs 0.3 (*)    Abs Immature Granulocytes 0.08 (*)    All other components within normal limits  LACTIC ACID, PLASMA  LACTIC ACID, PLASMA  URINALYSIS, ROUTINE W REFLEX MICROSCOPIC  POC URINE PREG, ED    EKG None  Radiology DG Chest 2 View  Result Date: 07/21/2021 CLINICAL DATA:  SOB EXAM: CHEST - 2 VIEW COMPARISON:  Chest x-ray 03/29/2020 FINDINGS: The heart and mediastinal contours are unchanged. No focal consolidation. No pulmonary edema. No pleural effusion. No pneumothorax. No acute osseous abnormality. IMPRESSION: No active cardiopulmonary disease. Electronically Signed   By: Iven Finn M.D.   On: 07/21/2021 23:08    Procedures Procedures    Medications Ordered in ED Medications  oseltamivir (TAMIFLU) capsule 75 mg (has no administration in time range)  acetaminophen (TYLENOL) tablet 1,000 mg (1,000 mg Oral Given 07/21/21 2234)  ibuprofen (ADVIL) tablet 400 mg (400 mg Oral Given 07/22/21  0050)  sodium chloride 0.9 % bolus 1,000 mL (1,000 mLs Intravenous New Bag/Given 07/22/21 0054)  ondansetron (ZOFRAN) injection 4 mg (4 mg Intravenous Given 07/22/21 0049)    ED Course/ Medical Decision Making/ A&P  Medical Decision Making  Patient with history of fibromyalgia, GERD, anxiety, hypotension, arthritis, migraines, irritable bowel syndrome presents to the emergency department with flulike symptoms.  Patient does report previous COVID infection resulting in "COVID lung".  Differential diagnosis includes recurrent COVID infection, influenza, other viral illness, bacterial pneumonia  Chest x-ray, independently reviewed and interpreted by myself, reveals no active disease.  Patient's influenza test is positive, COVID-negative.  Patient tachycardic, likely secondary to fever.  She had Tylenol prior to arrival, administered ibuprofen and IV fluids.  Patient with nausea, treated with Zofran.        Final Clinical Impression(s) / ED Diagnoses Final diagnoses:  Influenza    Rx / DC Orders ED Discharge Orders          Ordered    oseltamivir (TAMIFLU) 75 MG capsule  Every 12 hours        07/22/21 0129              [name], MD 07/22/21 [phone]

## 2021-07-21 NOTE — ED Triage Notes (Signed)
Pt arrives with c/o cough, congestion, sore throat, and headache that started about 2 days.

## 2021-07-22 LAB — CBC WITH DIFFERENTIAL/PLATELET
Abs Immature Granulocytes: 0.08 10*3/uL — ABNORMAL HIGH (ref 0.00–0.07)
Basophils Absolute: 0 10*3/uL (ref 0.0–0.1)
Basophils Relative: 0 %
Eosinophils Absolute: 0 10*3/uL (ref 0.0–0.5)
Eosinophils Relative: 0 %
HCT: 36.3 % (ref 36.0–46.0)
Hemoglobin: 12 g/dL (ref 12.0–15.0)
Immature Granulocytes: 1 %
Lymphocytes Relative: 4 %
Lymphs Abs: 0.3 10*3/uL — ABNORMAL LOW (ref 0.7–4.0)
MCH: 30 pg (ref 26.0–34.0)
MCHC: 33.1 g/dL (ref 30.0–36.0)
MCV: 90.8 fL (ref 80.0–100.0)
Monocytes Absolute: 0.8 10*3/uL (ref 0.1–1.0)
Monocytes Relative: 11 %
Neutro Abs: 6.5 10*3/uL (ref 1.7–7.7)
Neutrophils Relative %: 84 %
Platelets: 197 10*3/uL (ref 150–400)
RBC: 4 MIL/uL (ref 3.87–5.11)
RDW: 15.5 % (ref 11.5–15.5)
WBC: 7.8 10*3/uL (ref 4.0–10.5)
nRBC: 0 % (ref 0.0–0.2)

## 2021-07-22 LAB — COMPREHENSIVE METABOLIC PANEL
ALT: 18 U/L (ref 0–44)
AST: 18 U/L (ref 15–41)
Albumin: 3.4 g/dL — ABNORMAL LOW (ref 3.5–5.0)
Alkaline Phosphatase: 88 U/L (ref 38–126)
Anion gap: 8 (ref 5–15)
BUN: 14 mg/dL (ref 6–20)
CO2: 22 mmol/L (ref 22–32)
Calcium: 9.1 mg/dL (ref 8.9–10.3)
Chloride: 102 mmol/L (ref 98–111)
Creatinine, Ser: 0.98 mg/dL (ref 0.44–1.00)
GFR, Estimated: >60 mL/min (ref >60)
Glucose, Bld: 152 mg/dL — ABNORMAL HIGH (ref 70–99)
Potassium: 3.5 mmol/L (ref 3.5–5.1)
Sodium: 132 mmol/L — ABNORMAL LOW (ref 135–145)
Total Bilirubin: 0.2 mg/dL — ABNORMAL LOW (ref 0.3–1.2)
Total Protein: 7.2 g/dL (ref 6.5–8.1)

## 2021-07-22 LAB — LACTIC ACID, PLASMA: Lactic Acid, Venous: 1.6 mmol/L (ref 0.5–1.9)

## 2021-07-22 MED ORDER — OSELTAMIVIR PHOSPHATE 75 MG PO CAPS
75.0000 mg | ORAL_CAPSULE | Freq: Once | ORAL | Status: AC
  Administered 2021-07-22: 75 mg via ORAL
  Filled 2021-07-22: qty 1

## 2021-07-22 MED ORDER — ALBUTEROL SULFATE HFA 108 (90 BASE) MCG/ACT IN AERS
2.0000 | INHALATION_SPRAY | RESPIRATORY_TRACT | Status: DC | PRN
  Filled 2021-07-22: qty 6.7

## 2021-07-22 MED ORDER — OSELTAMIVIR PHOSPHATE 75 MG PO CAPS: 75.0000 mg | ORAL_CAPSULE | Freq: Two times a day (BID) | ORAL | 0 refills | Status: DC

## 2021-07-23 DIAGNOSIS — E1165 Type 2 diabetes mellitus with hyperglycemia: Secondary | ICD-10-CM | POA: Diagnosis not present

## 2021-07-23 DIAGNOSIS — Z299 Encounter for prophylactic measures, unspecified: Secondary | ICD-10-CM | POA: Diagnosis not present

## 2021-07-23 DIAGNOSIS — M069 Rheumatoid arthritis, unspecified: Secondary | ICD-10-CM | POA: Diagnosis not present

## 2021-07-23 DIAGNOSIS — Z789 Other specified health status: Secondary | ICD-10-CM | POA: Diagnosis not present

## 2021-08-13 DIAGNOSIS — L03119 Cellulitis of unspecified part of limb: Secondary | ICD-10-CM | POA: Diagnosis not present

## 2021-08-13 DIAGNOSIS — Z299 Encounter for prophylactic measures, unspecified: Secondary | ICD-10-CM | POA: Diagnosis not present

## 2021-08-13 DIAGNOSIS — E11649 Type 2 diabetes mellitus with hypoglycemia without coma: Secondary | ICD-10-CM | POA: Diagnosis not present

## 2021-08-13 DIAGNOSIS — D849 Immunodeficiency, unspecified: Secondary | ICD-10-CM | POA: Diagnosis not present

## 2021-08-16 DIAGNOSIS — Z299 Encounter for prophylactic measures, unspecified: Secondary | ICD-10-CM | POA: Diagnosis not present

## 2021-08-16 DIAGNOSIS — E1165 Type 2 diabetes mellitus with hyperglycemia: Secondary | ICD-10-CM | POA: Diagnosis not present

## 2021-08-16 DIAGNOSIS — Z2821 Immunization not carried out because of patient refusal: Secondary | ICD-10-CM | POA: Diagnosis not present

## 2021-08-16 DIAGNOSIS — Z789 Other specified health status: Secondary | ICD-10-CM | POA: Diagnosis not present

## 2021-08-16 DIAGNOSIS — J069 Acute upper respiratory infection, unspecified: Secondary | ICD-10-CM | POA: Diagnosis not present

## 2021-08-27 DIAGNOSIS — M9902 Segmental and somatic dysfunction of thoracic region: Secondary | ICD-10-CM | POA: Diagnosis not present

## 2021-08-27 DIAGNOSIS — M069 Rheumatoid arthritis, unspecified: Secondary | ICD-10-CM | POA: Diagnosis not present

## 2021-08-27 DIAGNOSIS — M62830 Muscle spasm of back: Secondary | ICD-10-CM | POA: Diagnosis not present

## 2021-08-27 DIAGNOSIS — M542 Cervicalgia: Secondary | ICD-10-CM | POA: Diagnosis not present

## 2021-08-27 DIAGNOSIS — M546 Pain in thoracic spine: Secondary | ICD-10-CM | POA: Diagnosis not present

## 2021-08-27 DIAGNOSIS — M9901 Segmental and somatic dysfunction of cervical region: Secondary | ICD-10-CM | POA: Diagnosis not present

## 2021-08-27 DIAGNOSIS — M9903 Segmental and somatic dysfunction of lumbar region: Secondary | ICD-10-CM | POA: Diagnosis not present

## 2021-08-27 DIAGNOSIS — M797 Fibromyalgia: Secondary | ICD-10-CM | POA: Diagnosis not present

## 2021-08-31 DIAGNOSIS — M62830 Muscle spasm of back: Secondary | ICD-10-CM | POA: Diagnosis not present

## 2021-08-31 DIAGNOSIS — M9901 Segmental and somatic dysfunction of cervical region: Secondary | ICD-10-CM | POA: Diagnosis not present

## 2021-08-31 DIAGNOSIS — M9902 Segmental and somatic dysfunction of thoracic region: Secondary | ICD-10-CM | POA: Diagnosis not present

## 2021-08-31 DIAGNOSIS — M797 Fibromyalgia: Secondary | ICD-10-CM | POA: Diagnosis not present

## 2021-08-31 DIAGNOSIS — M9903 Segmental and somatic dysfunction of lumbar region: Secondary | ICD-10-CM | POA: Diagnosis not present

## 2021-08-31 DIAGNOSIS — M069 Rheumatoid arthritis, unspecified: Secondary | ICD-10-CM | POA: Diagnosis not present

## 2021-08-31 DIAGNOSIS — M546 Pain in thoracic spine: Secondary | ICD-10-CM | POA: Diagnosis not present

## 2021-08-31 DIAGNOSIS — M542 Cervicalgia: Secondary | ICD-10-CM | POA: Diagnosis not present

## 2021-09-01 DIAGNOSIS — D849 Immunodeficiency, unspecified: Secondary | ICD-10-CM | POA: Diagnosis not present

## 2021-09-01 DIAGNOSIS — E7800 Pure hypercholesterolemia, unspecified: Secondary | ICD-10-CM | POA: Diagnosis not present

## 2021-09-02 DIAGNOSIS — Z789 Other specified health status: Secondary | ICD-10-CM | POA: Diagnosis not present

## 2021-09-02 DIAGNOSIS — E7800 Pure hypercholesterolemia, unspecified: Secondary | ICD-10-CM | POA: Diagnosis not present

## 2021-09-02 DIAGNOSIS — Z299 Encounter for prophylactic measures, unspecified: Secondary | ICD-10-CM | POA: Diagnosis not present

## 2021-09-02 DIAGNOSIS — M1380 Other specified arthritis, unspecified site: Secondary | ICD-10-CM | POA: Diagnosis not present

## 2021-09-02 DIAGNOSIS — E1165 Type 2 diabetes mellitus with hyperglycemia: Secondary | ICD-10-CM | POA: Diagnosis not present

## 2021-09-02 DIAGNOSIS — Z79899 Other long term (current) drug therapy: Secondary | ICD-10-CM | POA: Diagnosis not present

## 2021-09-02 DIAGNOSIS — M79643 Pain in unspecified hand: Secondary | ICD-10-CM | POA: Diagnosis not present

## 2021-09-03 DIAGNOSIS — M9902 Segmental and somatic dysfunction of thoracic region: Secondary | ICD-10-CM | POA: Diagnosis not present

## 2021-09-03 DIAGNOSIS — M62830 Muscle spasm of back: Secondary | ICD-10-CM | POA: Diagnosis not present

## 2021-09-03 DIAGNOSIS — M797 Fibromyalgia: Secondary | ICD-10-CM | POA: Diagnosis not present

## 2021-09-03 DIAGNOSIS — M9901 Segmental and somatic dysfunction of cervical region: Secondary | ICD-10-CM | POA: Diagnosis not present

## 2021-09-03 DIAGNOSIS — M542 Cervicalgia: Secondary | ICD-10-CM | POA: Diagnosis not present

## 2021-09-03 DIAGNOSIS — M9903 Segmental and somatic dysfunction of lumbar region: Secondary | ICD-10-CM | POA: Diagnosis not present

## 2021-09-03 DIAGNOSIS — M546 Pain in thoracic spine: Secondary | ICD-10-CM | POA: Diagnosis not present

## 2021-09-03 DIAGNOSIS — M069 Rheumatoid arthritis, unspecified: Secondary | ICD-10-CM | POA: Diagnosis not present

## 2021-09-07 DIAGNOSIS — M546 Pain in thoracic spine: Secondary | ICD-10-CM | POA: Diagnosis not present

## 2021-09-07 DIAGNOSIS — M542 Cervicalgia: Secondary | ICD-10-CM | POA: Diagnosis not present

## 2021-09-07 DIAGNOSIS — M62830 Muscle spasm of back: Secondary | ICD-10-CM | POA: Diagnosis not present

## 2021-09-07 DIAGNOSIS — M9901 Segmental and somatic dysfunction of cervical region: Secondary | ICD-10-CM | POA: Diagnosis not present

## 2021-09-07 DIAGNOSIS — M9902 Segmental and somatic dysfunction of thoracic region: Secondary | ICD-10-CM | POA: Diagnosis not present

## 2021-09-07 DIAGNOSIS — M069 Rheumatoid arthritis, unspecified: Secondary | ICD-10-CM | POA: Diagnosis not present

## 2021-09-07 DIAGNOSIS — M9903 Segmental and somatic dysfunction of lumbar region: Secondary | ICD-10-CM | POA: Diagnosis not present

## 2021-09-07 DIAGNOSIS — M797 Fibromyalgia: Secondary | ICD-10-CM | POA: Diagnosis not present

## 2021-09-10 DIAGNOSIS — M069 Rheumatoid arthritis, unspecified: Secondary | ICD-10-CM | POA: Diagnosis not present

## 2021-09-10 DIAGNOSIS — M9903 Segmental and somatic dysfunction of lumbar region: Secondary | ICD-10-CM | POA: Diagnosis not present

## 2021-09-10 DIAGNOSIS — M546 Pain in thoracic spine: Secondary | ICD-10-CM | POA: Diagnosis not present

## 2021-09-10 DIAGNOSIS — M62830 Muscle spasm of back: Secondary | ICD-10-CM | POA: Diagnosis not present

## 2021-09-10 DIAGNOSIS — M9901 Segmental and somatic dysfunction of cervical region: Secondary | ICD-10-CM | POA: Diagnosis not present

## 2021-09-10 DIAGNOSIS — M542 Cervicalgia: Secondary | ICD-10-CM | POA: Diagnosis not present

## 2021-09-10 DIAGNOSIS — M797 Fibromyalgia: Secondary | ICD-10-CM | POA: Diagnosis not present

## 2021-09-10 DIAGNOSIS — M9902 Segmental and somatic dysfunction of thoracic region: Secondary | ICD-10-CM | POA: Diagnosis not present

## 2021-09-16 DIAGNOSIS — L818 Other specified disorders of pigmentation: Secondary | ICD-10-CM | POA: Diagnosis not present

## 2021-09-21 DIAGNOSIS — M9903 Segmental and somatic dysfunction of lumbar region: Secondary | ICD-10-CM | POA: Diagnosis not present

## 2021-09-21 DIAGNOSIS — M62830 Muscle spasm of back: Secondary | ICD-10-CM | POA: Diagnosis not present

## 2021-09-21 DIAGNOSIS — M797 Fibromyalgia: Secondary | ICD-10-CM | POA: Diagnosis not present

## 2021-09-21 DIAGNOSIS — M9902 Segmental and somatic dysfunction of thoracic region: Secondary | ICD-10-CM | POA: Diagnosis not present

## 2021-09-21 DIAGNOSIS — M546 Pain in thoracic spine: Secondary | ICD-10-CM | POA: Diagnosis not present

## 2021-09-21 DIAGNOSIS — M9901 Segmental and somatic dysfunction of cervical region: Secondary | ICD-10-CM | POA: Diagnosis not present

## 2021-09-21 DIAGNOSIS — M069 Rheumatoid arthritis, unspecified: Secondary | ICD-10-CM | POA: Diagnosis not present

## 2021-09-21 DIAGNOSIS — M542 Cervicalgia: Secondary | ICD-10-CM | POA: Diagnosis not present

## 2021-09-23 DIAGNOSIS — M797 Fibromyalgia: Secondary | ICD-10-CM | POA: Diagnosis not present

## 2021-09-23 DIAGNOSIS — M9901 Segmental and somatic dysfunction of cervical region: Secondary | ICD-10-CM | POA: Diagnosis not present

## 2021-09-23 DIAGNOSIS — M9903 Segmental and somatic dysfunction of lumbar region: Secondary | ICD-10-CM | POA: Diagnosis not present

## 2021-09-23 DIAGNOSIS — M546 Pain in thoracic spine: Secondary | ICD-10-CM | POA: Diagnosis not present

## 2021-09-23 DIAGNOSIS — M9902 Segmental and somatic dysfunction of thoracic region: Secondary | ICD-10-CM | POA: Diagnosis not present

## 2021-09-23 DIAGNOSIS — M069 Rheumatoid arthritis, unspecified: Secondary | ICD-10-CM | POA: Diagnosis not present

## 2021-09-23 DIAGNOSIS — M62830 Muscle spasm of back: Secondary | ICD-10-CM | POA: Diagnosis not present

## 2021-09-23 DIAGNOSIS — M542 Cervicalgia: Secondary | ICD-10-CM | POA: Diagnosis not present

## 2021-10-06 DIAGNOSIS — M9903 Segmental and somatic dysfunction of lumbar region: Secondary | ICD-10-CM | POA: Diagnosis not present

## 2021-10-06 DIAGNOSIS — M797 Fibromyalgia: Secondary | ICD-10-CM | POA: Diagnosis not present

## 2021-10-06 DIAGNOSIS — M542 Cervicalgia: Secondary | ICD-10-CM | POA: Diagnosis not present

## 2021-10-06 DIAGNOSIS — M069 Rheumatoid arthritis, unspecified: Secondary | ICD-10-CM | POA: Diagnosis not present

## 2021-10-06 DIAGNOSIS — M62830 Muscle spasm of back: Secondary | ICD-10-CM | POA: Diagnosis not present

## 2021-10-06 DIAGNOSIS — M9901 Segmental and somatic dysfunction of cervical region: Secondary | ICD-10-CM | POA: Diagnosis not present

## 2021-10-06 DIAGNOSIS — M546 Pain in thoracic spine: Secondary | ICD-10-CM | POA: Diagnosis not present

## 2021-10-06 DIAGNOSIS — M9902 Segmental and somatic dysfunction of thoracic region: Secondary | ICD-10-CM | POA: Diagnosis not present

## 2021-10-08 DIAGNOSIS — M9902 Segmental and somatic dysfunction of thoracic region: Secondary | ICD-10-CM | POA: Diagnosis not present

## 2021-10-08 DIAGNOSIS — M546 Pain in thoracic spine: Secondary | ICD-10-CM | POA: Diagnosis not present

## 2021-10-08 DIAGNOSIS — M797 Fibromyalgia: Secondary | ICD-10-CM | POA: Diagnosis not present

## 2021-10-08 DIAGNOSIS — M069 Rheumatoid arthritis, unspecified: Secondary | ICD-10-CM | POA: Diagnosis not present

## 2021-10-08 DIAGNOSIS — M9903 Segmental and somatic dysfunction of lumbar region: Secondary | ICD-10-CM | POA: Diagnosis not present

## 2021-10-08 DIAGNOSIS — M9901 Segmental and somatic dysfunction of cervical region: Secondary | ICD-10-CM | POA: Diagnosis not present

## 2021-10-08 DIAGNOSIS — M542 Cervicalgia: Secondary | ICD-10-CM | POA: Diagnosis not present

## 2021-10-08 DIAGNOSIS — M62830 Muscle spasm of back: Secondary | ICD-10-CM | POA: Diagnosis not present

## 2021-10-12 DIAGNOSIS — E119 Type 2 diabetes mellitus without complications: Secondary | ICD-10-CM | POA: Diagnosis not present

## 2021-10-12 DIAGNOSIS — Z794 Long term (current) use of insulin: Secondary | ICD-10-CM | POA: Diagnosis not present

## 2021-10-14 DIAGNOSIS — I10 Essential (primary) hypertension: Secondary | ICD-10-CM | POA: Diagnosis not present

## 2021-10-14 DIAGNOSIS — E785 Hyperlipidemia, unspecified: Secondary | ICD-10-CM | POA: Diagnosis not present

## 2021-10-15 DIAGNOSIS — M797 Fibromyalgia: Secondary | ICD-10-CM | POA: Diagnosis not present

## 2021-10-15 DIAGNOSIS — M9901 Segmental and somatic dysfunction of cervical region: Secondary | ICD-10-CM | POA: Diagnosis not present

## 2021-10-15 DIAGNOSIS — M542 Cervicalgia: Secondary | ICD-10-CM | POA: Diagnosis not present

## 2021-10-15 DIAGNOSIS — M9902 Segmental and somatic dysfunction of thoracic region: Secondary | ICD-10-CM | POA: Diagnosis not present

## 2021-10-15 DIAGNOSIS — M62830 Muscle spasm of back: Secondary | ICD-10-CM | POA: Diagnosis not present

## 2021-10-15 DIAGNOSIS — M9903 Segmental and somatic dysfunction of lumbar region: Secondary | ICD-10-CM | POA: Diagnosis not present

## 2021-10-15 DIAGNOSIS — M069 Rheumatoid arthritis, unspecified: Secondary | ICD-10-CM | POA: Diagnosis not present

## 2021-10-15 DIAGNOSIS — M546 Pain in thoracic spine: Secondary | ICD-10-CM | POA: Diagnosis not present

## 2021-11-05 DIAGNOSIS — R21 Rash and other nonspecific skin eruption: Secondary | ICD-10-CM | POA: Diagnosis not present

## 2021-11-05 DIAGNOSIS — Z713 Dietary counseling and surveillance: Secondary | ICD-10-CM | POA: Diagnosis not present

## 2021-11-05 DIAGNOSIS — Z299 Encounter for prophylactic measures, unspecified: Secondary | ICD-10-CM | POA: Diagnosis not present

## 2021-11-05 DIAGNOSIS — E1165 Type 2 diabetes mellitus with hyperglycemia: Secondary | ICD-10-CM | POA: Diagnosis not present

## 2021-11-24 ENCOUNTER — Encounter: Payer: Medicare Other | Attending: Internal Medicine | Admitting: Nutrition

## 2021-11-24 VITALS — Ht 68.0 in | Wt 248.0 lb

## 2021-11-24 DIAGNOSIS — Z713 Dietary counseling and surveillance: Secondary | ICD-10-CM | POA: Insufficient documentation

## 2021-11-24 DIAGNOSIS — Z6839 Body mass index (BMI) 39.0-39.9, adult: Secondary | ICD-10-CM | POA: Diagnosis not present

## 2021-11-24 DIAGNOSIS — F319 Bipolar disorder, unspecified: Secondary | ICD-10-CM

## 2021-11-24 DIAGNOSIS — E669 Obesity, unspecified: Secondary | ICD-10-CM | POA: Diagnosis present

## 2021-11-24 DIAGNOSIS — E118 Type 2 diabetes mellitus with unspecified complications: Secondary | ICD-10-CM

## 2021-11-24 DIAGNOSIS — E1165 Type 2 diabetes mellitus with hyperglycemia: Secondary | ICD-10-CM | POA: Insufficient documentation

## 2021-11-24 NOTE — Progress Notes (Signed)
Medical Nutrition Therapy  Appointment Start time:  3500  Appointment End time:  21  Primary concerns today: Dm Type 2, Obesity,   Referral diagnosis: E11.8, E66.9 Preferred learning style: No preference. Learning readiness: Changes in progress    NUTRITION ASSESSMENT  Type 2 DM with obesity. Sees Dr. Manuella Ghazi in [place]. On metformin 750 mg a day. 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 This is effecting her blood sugars.    Anthropometrics    Wt Readings from Last 3 Encounters:  11/24/21 248 lb (112.5 kg)  07/21/21 249 lb (112.9 kg)  03/29/20 234 lb (106.1 kg)   Ht Readings from Last 3 Encounters:  11/24/21 '5\' 8"'$  (1.727 m)  07/21/21 '5\' 7"'$  (1.702 m)  03/29/20 '5\' 7"'$  (1.702 m)   Body mass index is 37.71 kg/m. '@BMIFA'$ @ Facility age limit for growth percentiles is 20 years. Facility age limit for growth percentiles is 20 years.   Clinical Medical Hx: Bipolar disease, high cholesterol, incontinence, anxiety, depression, OCD Medications: See Chart Labs: Last A1c 6 ,  Notable Signs/Symptoms: see cahrt  Lifestyle & Dietary Hx   Estimated daily fluid intake: 40-64 oz Supplements: Folic acid, Vit B 12, MVI, Vit E Sleep: 6 Stress / self-care: Financial  Current average weekly physical activity: Has bene walking some.  24-Hr Dietary Recall First Meal: 2 pm   Snack:  Second Meal: 938 Scrambled egg,  [place] bacon, wheat pancake, maple syrup, water Snack:  Third Meal:  182 [place] slices with cheese stick, 10 grapes, diet gingerale, waer Snack:  Beverages: water,   Estimated Energy Needs Calories: 1200 Carbohydrate: 135g Protein: 90g Fat: 33g   NUTRITION DIAGNOSIS  NB-1.1 Food and nutrition-related knowledge deficit As related to Obesity.  As evidenced by BMI 37 .   NUTRITION INTERVENTION  Nutrition education (E-1) on the following topics:  Lifestyle Medicine  - Whole Food, Plant Predominant Nutrition is highly  recommended: Eat Plenty of vegetables, Mushrooms, fruits, Legumes, Whole Grains, Nuts, seeds in lieu of processed meats, processed snacks/pastries red meat, poultry, eggs.    -It is better to avoid simple carbohydrates including: Cakes, Sweet Desserts, Ice Cream, Soda (diet and regular), Sweet Tea, Candies, Chips, Cookies, Store Bought Juices, Alcohol in Excess of  1-2 drinks a day, Lemonade,  Artificial Sweeteners, Doughnuts, Coffee Creamers, "Sugar-free" Products, etc, etc.  This is not a complete list.....  Exercise: If you are able: 30 -60 minutes a day ,4 days a week, or 150 minutes a week.  The longer the better.  Combine stretch, strength, and aerobic activities.  If you were told in the past that you have high risk for cardiovascular diseases, you may seek evaluation by your heart doctor prior to initiating moderate to intense exercise programs.   Handouts Provided Include  Lifestyle Medicine Meal Plan Card  Learning Style & Readiness for Change Teaching method utilized: Visual & Auditory  Demonstrated degree of understanding via: Teach Back  Barriers to learning/adherence to lifestyle change: none  Goals Established by Pt Goals  Eat three meals per day Choose whole food plant based foods to eat Cut out fast food, processed foods Drink ONLY water. No snacks Don't eat after 7 Exercise 30 minutes 5 days per week Lose 1 lb per week Eat meals on time   MONITORING & EVALUATION Dietary intake, weekly physical activity, and weight in 1 month.  Next Steps  Patient is to work on meal planning and exercise.Marland Kitchen

## 2021-11-26 DIAGNOSIS — M797 Fibromyalgia: Secondary | ICD-10-CM | POA: Diagnosis not present

## 2021-11-26 DIAGNOSIS — Z299 Encounter for prophylactic measures, unspecified: Secondary | ICD-10-CM | POA: Diagnosis not present

## 2021-11-26 DIAGNOSIS — M9903 Segmental and somatic dysfunction of lumbar region: Secondary | ICD-10-CM | POA: Diagnosis not present

## 2021-11-26 DIAGNOSIS — J069 Acute upper respiratory infection, unspecified: Secondary | ICD-10-CM | POA: Diagnosis not present

## 2021-11-26 DIAGNOSIS — M9901 Segmental and somatic dysfunction of cervical region: Secondary | ICD-10-CM | POA: Diagnosis not present

## 2021-11-26 DIAGNOSIS — M546 Pain in thoracic spine: Secondary | ICD-10-CM | POA: Diagnosis not present

## 2021-11-26 DIAGNOSIS — M542 Cervicalgia: Secondary | ICD-10-CM | POA: Diagnosis not present

## 2021-11-26 DIAGNOSIS — M069 Rheumatoid arthritis, unspecified: Secondary | ICD-10-CM | POA: Diagnosis not present

## 2021-11-26 DIAGNOSIS — M62830 Muscle spasm of back: Secondary | ICD-10-CM | POA: Diagnosis not present

## 2021-11-26 DIAGNOSIS — M9902 Segmental and somatic dysfunction of thoracic region: Secondary | ICD-10-CM | POA: Diagnosis not present

## 2021-12-03 DIAGNOSIS — Z299 Encounter for prophylactic measures, unspecified: Secondary | ICD-10-CM | POA: Diagnosis not present

## 2021-12-03 DIAGNOSIS — J209 Acute bronchitis, unspecified: Secondary | ICD-10-CM | POA: Diagnosis not present

## 2021-12-03 DIAGNOSIS — M79646 Pain in unspecified finger(s): Secondary | ICD-10-CM | POA: Diagnosis not present

## 2021-12-03 DIAGNOSIS — E1165 Type 2 diabetes mellitus with hyperglycemia: Secondary | ICD-10-CM | POA: Diagnosis not present

## 2021-12-16 ENCOUNTER — Encounter: Payer: Self-pay | Admitting: Nutrition

## 2021-12-16 NOTE — Patient Instructions (Signed)
Goals  Eat three meals per day Choose whole food plant based foods to eat Cut out fast food, processed foods Drink ONLY water. No snacks Don't eat after 7 Exercise 30 minutes 5 days per week Lose 1 lb per week Eat meals on time

## 2021-12-20 DIAGNOSIS — M546 Pain in thoracic spine: Secondary | ICD-10-CM | POA: Diagnosis not present

## 2021-12-20 DIAGNOSIS — M9902 Segmental and somatic dysfunction of thoracic region: Secondary | ICD-10-CM | POA: Diagnosis not present

## 2021-12-20 DIAGNOSIS — M069 Rheumatoid arthritis, unspecified: Secondary | ICD-10-CM | POA: Diagnosis not present

## 2021-12-20 DIAGNOSIS — M9901 Segmental and somatic dysfunction of cervical region: Secondary | ICD-10-CM | POA: Diagnosis not present

## 2021-12-20 DIAGNOSIS — M797 Fibromyalgia: Secondary | ICD-10-CM | POA: Diagnosis not present

## 2021-12-20 DIAGNOSIS — M62830 Muscle spasm of back: Secondary | ICD-10-CM | POA: Diagnosis not present

## 2021-12-20 DIAGNOSIS — M9903 Segmental and somatic dysfunction of lumbar region: Secondary | ICD-10-CM | POA: Diagnosis not present

## 2021-12-20 DIAGNOSIS — M542 Cervicalgia: Secondary | ICD-10-CM | POA: Diagnosis not present

## 2021-12-23 DIAGNOSIS — E119 Type 2 diabetes mellitus without complications: Secondary | ICD-10-CM | POA: Diagnosis not present

## 2021-12-23 DIAGNOSIS — Z794 Long term (current) use of insulin: Secondary | ICD-10-CM | POA: Diagnosis not present

## 2021-12-31 DIAGNOSIS — Z79899 Other long term (current) drug therapy: Secondary | ICD-10-CM | POA: Diagnosis not present

## 2021-12-31 DIAGNOSIS — E1165 Type 2 diabetes mellitus with hyperglycemia: Secondary | ICD-10-CM | POA: Diagnosis not present

## 2021-12-31 DIAGNOSIS — M797 Fibromyalgia: Secondary | ICD-10-CM | POA: Diagnosis not present

## 2021-12-31 DIAGNOSIS — M1380 Other specified arthritis, unspecified site: Secondary | ICD-10-CM | POA: Diagnosis not present

## 2021-12-31 DIAGNOSIS — Z299 Encounter for prophylactic measures, unspecified: Secondary | ICD-10-CM | POA: Diagnosis not present

## 2022-01-03 DIAGNOSIS — Z794 Long term (current) use of insulin: Secondary | ICD-10-CM | POA: Diagnosis not present

## 2022-01-03 DIAGNOSIS — E119 Type 2 diabetes mellitus without complications: Secondary | ICD-10-CM | POA: Diagnosis not present

## 2022-01-07 DIAGNOSIS — M069 Rheumatoid arthritis, unspecified: Secondary | ICD-10-CM | POA: Diagnosis not present

## 2022-01-07 DIAGNOSIS — M542 Cervicalgia: Secondary | ICD-10-CM | POA: Diagnosis not present

## 2022-01-07 DIAGNOSIS — M9903 Segmental and somatic dysfunction of lumbar region: Secondary | ICD-10-CM | POA: Diagnosis not present

## 2022-01-07 DIAGNOSIS — M9902 Segmental and somatic dysfunction of thoracic region: Secondary | ICD-10-CM | POA: Diagnosis not present

## 2022-01-07 DIAGNOSIS — M546 Pain in thoracic spine: Secondary | ICD-10-CM | POA: Diagnosis not present

## 2022-01-07 DIAGNOSIS — M9901 Segmental and somatic dysfunction of cervical region: Secondary | ICD-10-CM | POA: Diagnosis not present

## 2022-01-07 DIAGNOSIS — M797 Fibromyalgia: Secondary | ICD-10-CM | POA: Diagnosis not present

## 2022-01-07 DIAGNOSIS — M62830 Muscle spasm of back: Secondary | ICD-10-CM | POA: Diagnosis not present

## 2022-01-10 ENCOUNTER — Ambulatory Visit: Payer: Medicare Other | Admitting: Nutrition

## 2022-01-14 DIAGNOSIS — M542 Cervicalgia: Secondary | ICD-10-CM | POA: Diagnosis not present

## 2022-01-14 DIAGNOSIS — M546 Pain in thoracic spine: Secondary | ICD-10-CM | POA: Diagnosis not present

## 2022-01-14 DIAGNOSIS — M069 Rheumatoid arthritis, unspecified: Secondary | ICD-10-CM | POA: Diagnosis not present

## 2022-01-14 DIAGNOSIS — M9903 Segmental and somatic dysfunction of lumbar region: Secondary | ICD-10-CM | POA: Diagnosis not present

## 2022-01-14 DIAGNOSIS — M62830 Muscle spasm of back: Secondary | ICD-10-CM | POA: Diagnosis not present

## 2022-01-14 DIAGNOSIS — M9902 Segmental and somatic dysfunction of thoracic region: Secondary | ICD-10-CM | POA: Diagnosis not present

## 2022-01-14 DIAGNOSIS — M9901 Segmental and somatic dysfunction of cervical region: Secondary | ICD-10-CM | POA: Diagnosis not present

## 2022-01-14 DIAGNOSIS — M797 Fibromyalgia: Secondary | ICD-10-CM | POA: Diagnosis not present

## 2022-01-23 DIAGNOSIS — E119 Type 2 diabetes mellitus without complications: Secondary | ICD-10-CM | POA: Diagnosis not present

## 2022-01-23 DIAGNOSIS — Z794 Long term (current) use of insulin: Secondary | ICD-10-CM | POA: Diagnosis not present

## 2022-01-28 DIAGNOSIS — M542 Cervicalgia: Secondary | ICD-10-CM | POA: Diagnosis not present

## 2022-01-28 DIAGNOSIS — M546 Pain in thoracic spine: Secondary | ICD-10-CM | POA: Diagnosis not present

## 2022-01-28 DIAGNOSIS — M069 Rheumatoid arthritis, unspecified: Secondary | ICD-10-CM | POA: Diagnosis not present

## 2022-01-28 DIAGNOSIS — M9902 Segmental and somatic dysfunction of thoracic region: Secondary | ICD-10-CM | POA: Diagnosis not present

## 2022-01-28 DIAGNOSIS — M62830 Muscle spasm of back: Secondary | ICD-10-CM | POA: Diagnosis not present

## 2022-01-28 DIAGNOSIS — M797 Fibromyalgia: Secondary | ICD-10-CM | POA: Diagnosis not present

## 2022-01-28 DIAGNOSIS — M9903 Segmental and somatic dysfunction of lumbar region: Secondary | ICD-10-CM | POA: Diagnosis not present

## 2022-01-28 DIAGNOSIS — M9901 Segmental and somatic dysfunction of cervical region: Secondary | ICD-10-CM | POA: Diagnosis not present

## 2022-02-01 ENCOUNTER — Other Ambulatory Visit: Payer: Self-pay | Admitting: Internal Medicine

## 2022-02-01 DIAGNOSIS — Z1231 Encounter for screening mammogram for malignant neoplasm of breast: Secondary | ICD-10-CM

## 2022-02-03 DIAGNOSIS — E119 Type 2 diabetes mellitus without complications: Secondary | ICD-10-CM | POA: Diagnosis not present

## 2022-02-03 DIAGNOSIS — Z794 Long term (current) use of insulin: Secondary | ICD-10-CM | POA: Diagnosis not present

## 2022-02-07 DIAGNOSIS — M542 Cervicalgia: Secondary | ICD-10-CM | POA: Diagnosis not present

## 2022-02-07 DIAGNOSIS — M546 Pain in thoracic spine: Secondary | ICD-10-CM | POA: Diagnosis not present

## 2022-02-07 DIAGNOSIS — M9903 Segmental and somatic dysfunction of lumbar region: Secondary | ICD-10-CM | POA: Diagnosis not present

## 2022-02-07 DIAGNOSIS — M069 Rheumatoid arthritis, unspecified: Secondary | ICD-10-CM | POA: Diagnosis not present

## 2022-02-07 DIAGNOSIS — M62830 Muscle spasm of back: Secondary | ICD-10-CM | POA: Diagnosis not present

## 2022-02-07 DIAGNOSIS — M9902 Segmental and somatic dysfunction of thoracic region: Secondary | ICD-10-CM | POA: Diagnosis not present

## 2022-02-07 DIAGNOSIS — M797 Fibromyalgia: Secondary | ICD-10-CM | POA: Diagnosis not present

## 2022-02-07 DIAGNOSIS — M9901 Segmental and somatic dysfunction of cervical region: Secondary | ICD-10-CM | POA: Diagnosis not present

## 2022-02-08 ENCOUNTER — Telehealth: Payer: Self-pay | Admitting: Nutrition

## 2022-02-08 ENCOUNTER — Ambulatory Visit: Payer: Medicare Other | Admitting: Nutrition

## 2022-02-08 NOTE — Telephone Encounter (Signed)
Tc to r/s her missed appt. She thought it was tomorrow.  She complains of her CGM going to when her BS are in the 90's and in the 150's. Advised to change alarm to go off with 70 or below or higher than 200 mg/dl. She verbalized understanding. Appt is 02-16-22 at 230 pm.

## 2022-02-08 NOTE — Telephone Encounter (Signed)
Correction appt is at 330 on 02-16-22.

## 2022-02-16 ENCOUNTER — Encounter: Payer: Medicare Other | Attending: Internal Medicine | Admitting: Nutrition

## 2022-02-16 VITALS — Ht 67.5 in | Wt 243.0 lb

## 2022-02-16 DIAGNOSIS — E669 Obesity, unspecified: Secondary | ICD-10-CM | POA: Diagnosis present

## 2022-02-16 DIAGNOSIS — Z6837 Body mass index (BMI) 37.0-37.9, adult: Secondary | ICD-10-CM | POA: Diagnosis not present

## 2022-02-16 DIAGNOSIS — E119 Type 2 diabetes mellitus without complications: Secondary | ICD-10-CM | POA: Diagnosis not present

## 2022-02-16 DIAGNOSIS — E118 Type 2 diabetes mellitus with unspecified complications: Secondary | ICD-10-CM

## 2022-02-16 DIAGNOSIS — F319 Bipolar disorder, unspecified: Secondary | ICD-10-CM

## 2022-02-16 DIAGNOSIS — Z713 Dietary counseling and surveillance: Secondary | ICD-10-CM | POA: Insufficient documentation

## 2022-02-16 NOTE — Progress Notes (Unsigned)
Medical Nutrition Therapy  Appointment Start time:  9381  Appointment End time:  1600  Primary concerns today: Dm Type 2, Obesity,   Referral diagnosis: E11.8, E66.9 Preferred learning style: No preference. Learning readiness: Changes in progress    NUTRITION ASSESSMENT  Type 2 DM with obesity. Sees Dr. Manuella Ghazi in [place]. A1C 6.1%  Cut out sugars, eating more fruits and vegetables, more foods from her garden. Cut out sugars and sodas. Working on Social research officer, government. Sleeping much better. Getting to bed earlier and getting up earlier. Cooking foods at home. Feels much better. Lost 5 lbs.    Anthropometrics    Wt Readings from Last 3 Encounters:  11/24/21 248 lb (112.5 kg)  07/21/21 249 lb (112.9 kg)  03/29/20 234 lb (106.1 kg)   Ht Readings from Last 3 Encounters:  11/24/21 '5\' 8"'$  (1.727 m)  07/21/21 '5\' 7"'$  (1.702 m)  03/29/20 '5\' 7"'$  (1.702 m)   There is no height or weight on file to calculate BMI. '@BMIFA'$ @ Facility age limit for growth %iles is 20 years. Facility age limit for growth %iles is 20 years.   Clinical Medical Hx: Bipolar disease, high cholesterol, incontinence, anxiety, depression, OCD Medications: See Chart Labs: Last A1c 6 ,  Notable Signs/Symptoms: see cahrt  Lifestyle & Dietary Hx   Estimated daily fluid intake: 40-64 oz Supplements: Folic acid, Vit B 12, MVI, Vit E Sleep: 6 Stress / self-care: Financial  Current average weekly physical activity: Has bene walking some.  24-Hr Dietary Recall First Meal: 2 pm   Snack:  Second Meal: 829 Scrambled egg,  [place] bacon, wheat pancake, maple syrup, water Snack:  Third Meal:  937 [place] slices with cheese stick, 10 grapes, diet gingerale, waer Snack:  Beverages: water,   Estimated Energy Needs Calories: 1200 Carbohydrate: 135g Protein: 90g Fat: 33g   NUTRITION DIAGNOSIS  NB-1.1 Food and nutrition-related knowledge deficit As related to Obesity.  As evidenced by BMI 37  .   NUTRITION INTERVENTION  Nutrition education (E-1) on the following topics:  Lifestyle Medicine  - Whole Food, Plant Predominant Nutrition is highly recommended: Eat Plenty of vegetables, Mushrooms, fruits, Legumes, Whole Grains, Nuts, seeds in lieu of processed meats, processed snacks/pastries red meat, poultry, eggs.    -It is better to avoid simple carbohydrates including: Cakes, Sweet Desserts, Ice Cream, Soda (diet and regular), Sweet Tea, Candies, Chips, Cookies, Store Bought Juices, Alcohol in Excess of  1-2 drinks a day, Lemonade,  Artificial Sweeteners, Doughnuts, Coffee Creamers, "Sugar-free" Products, etc, etc.  This is not a complete list.....  Exercise: If you are able: 30 -60 minutes a day ,4 days a week, or 150 minutes a week.  The longer the better.  Combine stretch, strength, and aerobic activities.  If you were told in the past that you have high risk for cardiovascular diseases, you may seek evaluation by your heart doctor prior to initiating moderate to intense exercise programs.   Handouts Provided Include  Lifestyle Medicine Meal Plan Card  Learning Style & Readiness for Change Teaching method utilized: Visual & Auditory  Demonstrated degree of understanding via: Teach Back  Barriers to learning/adherence to lifestyle change: none  Goals Established by Pt Goals  Eat three meals per day Choose whole food plant based foods to eat Cut out fast food, processed foods Drink ONLY water. No snacks Don't eat after 7 Exercise 30 minutes 5 days per week Lose 1 lb per week Eat meals on time   MONITORING & EVALUATION  Dietary intake, weekly physical activity, and weight in 1 month.  Next Steps  Patient is to work on meal planning and exercise.Marland Kitchen

## 2022-02-16 NOTE — Patient Instructions (Addendum)
Goals Keep up the [facility] JOB!! Increase exercise to 150 mins a week. Drink 100 oz of water per day. Take metformin daily.

## 2022-02-21 DIAGNOSIS — M9903 Segmental and somatic dysfunction of lumbar region: Secondary | ICD-10-CM | POA: Diagnosis not present

## 2022-02-21 DIAGNOSIS — M62830 Muscle spasm of back: Secondary | ICD-10-CM | POA: Diagnosis not present

## 2022-02-21 DIAGNOSIS — M069 Rheumatoid arthritis, unspecified: Secondary | ICD-10-CM | POA: Diagnosis not present

## 2022-02-21 DIAGNOSIS — M9902 Segmental and somatic dysfunction of thoracic region: Secondary | ICD-10-CM | POA: Diagnosis not present

## 2022-02-21 DIAGNOSIS — M542 Cervicalgia: Secondary | ICD-10-CM | POA: Diagnosis not present

## 2022-02-21 DIAGNOSIS — M9901 Segmental and somatic dysfunction of cervical region: Secondary | ICD-10-CM | POA: Diagnosis not present

## 2022-02-21 DIAGNOSIS — M797 Fibromyalgia: Secondary | ICD-10-CM | POA: Diagnosis not present

## 2022-02-21 DIAGNOSIS — M546 Pain in thoracic spine: Secondary | ICD-10-CM | POA: Diagnosis not present

## 2022-02-23 ENCOUNTER — Ambulatory Visit
Admission: RE | Admit: 2022-02-23 | Discharge: 2022-02-23 | Disposition: A | Discharge status: Home / Self Care | Payer: Medicare Other | Source: Ambulatory Visit | Attending: Internal Medicine | Admitting: Internal Medicine

## 2022-02-23 DIAGNOSIS — Z1231 Encounter for screening mammogram for malignant neoplasm of breast: Secondary | ICD-10-CM

## 2022-02-25 DIAGNOSIS — M9901 Segmental and somatic dysfunction of cervical region: Secondary | ICD-10-CM | POA: Diagnosis not present

## 2022-02-25 DIAGNOSIS — M9902 Segmental and somatic dysfunction of thoracic region: Secondary | ICD-10-CM | POA: Diagnosis not present

## 2022-02-25 DIAGNOSIS — M9903 Segmental and somatic dysfunction of lumbar region: Secondary | ICD-10-CM | POA: Diagnosis not present

## 2022-02-25 DIAGNOSIS — M546 Pain in thoracic spine: Secondary | ICD-10-CM | POA: Diagnosis not present

## 2022-02-25 DIAGNOSIS — M62830 Muscle spasm of back: Secondary | ICD-10-CM | POA: Diagnosis not present

## 2022-02-25 DIAGNOSIS — M797 Fibromyalgia: Secondary | ICD-10-CM | POA: Diagnosis not present

## 2022-02-25 DIAGNOSIS — M069 Rheumatoid arthritis, unspecified: Secondary | ICD-10-CM | POA: Diagnosis not present

## 2022-02-25 DIAGNOSIS — M542 Cervicalgia: Secondary | ICD-10-CM | POA: Diagnosis not present

## 2022-03-01 NOTE — Progress Notes (Unsigned)
   Dear Dr. Manuella Ghazi,   [name] [facility] and was scheduled to have an appointment for: Dm Follow up    This letter is to inform you that your patient did not keep their appointment on 02/08/22  At the patient's next visit, please encourage follow up at the [facility] and contact us if you wish for them to reschedule. Our contact number is [phone].   Thank you,  Jearld Fenton, RDN, CDE

## 2022-03-02 DIAGNOSIS — Z7189 Other specified counseling: Secondary | ICD-10-CM | POA: Diagnosis not present

## 2022-03-02 DIAGNOSIS — Z0000 Encounter for general adult medical examination without abnormal findings: Secondary | ICD-10-CM | POA: Diagnosis not present

## 2022-03-02 DIAGNOSIS — Z299 Encounter for prophylactic measures, unspecified: Secondary | ICD-10-CM | POA: Diagnosis not present

## 2022-03-02 DIAGNOSIS — Z713 Dietary counseling and surveillance: Secondary | ICD-10-CM | POA: Diagnosis not present

## 2022-03-02 DIAGNOSIS — M069 Rheumatoid arthritis, unspecified: Secondary | ICD-10-CM | POA: Diagnosis not present

## 2022-03-16 DIAGNOSIS — Z789 Other specified health status: Secondary | ICD-10-CM | POA: Diagnosis not present

## 2022-03-16 DIAGNOSIS — Z299 Encounter for prophylactic measures, unspecified: Secondary | ICD-10-CM | POA: Diagnosis not present

## 2022-03-16 DIAGNOSIS — E1165 Type 2 diabetes mellitus with hyperglycemia: Secondary | ICD-10-CM | POA: Diagnosis not present

## 2022-03-16 DIAGNOSIS — M79671 Pain in right foot: Secondary | ICD-10-CM | POA: Diagnosis not present

## 2022-03-16 DIAGNOSIS — M79672 Pain in left foot: Secondary | ICD-10-CM | POA: Diagnosis not present

## 2022-03-16 DIAGNOSIS — M109 Gout, unspecified: Secondary | ICD-10-CM | POA: Diagnosis not present

## 2022-03-25 DIAGNOSIS — D849 Immunodeficiency, unspecified: Secondary | ICD-10-CM | POA: Diagnosis not present

## 2022-03-25 DIAGNOSIS — E119 Type 2 diabetes mellitus without complications: Secondary | ICD-10-CM | POA: Diagnosis not present

## 2022-03-25 DIAGNOSIS — M10071 Idiopathic gout, right ankle and foot: Secondary | ICD-10-CM | POA: Diagnosis not present

## 2022-03-25 DIAGNOSIS — M069 Rheumatoid arthritis, unspecified: Secondary | ICD-10-CM | POA: Diagnosis not present

## 2022-03-25 DIAGNOSIS — Z299 Encounter for prophylactic measures, unspecified: Secondary | ICD-10-CM | POA: Diagnosis not present

## 2022-03-25 DIAGNOSIS — J029 Acute pharyngitis, unspecified: Secondary | ICD-10-CM | POA: Diagnosis not present

## 2022-03-25 DIAGNOSIS — M109 Gout, unspecified: Secondary | ICD-10-CM | POA: Diagnosis not present

## 2022-03-25 DIAGNOSIS — Z794 Long term (current) use of insulin: Secondary | ICD-10-CM | POA: Diagnosis not present

## 2022-03-30 DIAGNOSIS — J029 Acute pharyngitis, unspecified: Secondary | ICD-10-CM | POA: Diagnosis not present

## 2022-03-30 DIAGNOSIS — Z299 Encounter for prophylactic measures, unspecified: Secondary | ICD-10-CM | POA: Diagnosis not present

## 2022-03-30 DIAGNOSIS — R070 Pain in throat: Secondary | ICD-10-CM | POA: Diagnosis not present

## 2022-04-13 DIAGNOSIS — J302 Other seasonal allergic rhinitis: Secondary | ICD-10-CM | POA: Diagnosis not present

## 2022-04-13 DIAGNOSIS — J069 Acute upper respiratory infection, unspecified: Secondary | ICD-10-CM | POA: Diagnosis not present

## 2022-04-13 DIAGNOSIS — Z299 Encounter for prophylactic measures, unspecified: Secondary | ICD-10-CM | POA: Diagnosis not present

## 2022-04-13 DIAGNOSIS — B3741 Candidal cystitis and urethritis: Secondary | ICD-10-CM | POA: Diagnosis not present

## 2022-05-14 DIAGNOSIS — J069 Acute upper respiratory infection, unspecified: Secondary | ICD-10-CM | POA: Diagnosis not present

## 2022-05-14 DIAGNOSIS — R070 Pain in throat: Secondary | ICD-10-CM | POA: Diagnosis not present

## 2022-05-14 DIAGNOSIS — Z20822 Contact with and (suspected) exposure to covid-19: Secondary | ICD-10-CM | POA: Diagnosis not present

## 2022-05-16 DIAGNOSIS — Z299 Encounter for prophylactic measures, unspecified: Secondary | ICD-10-CM | POA: Diagnosis not present

## 2022-05-16 DIAGNOSIS — R6883 Chills (without fever): Secondary | ICD-10-CM | POA: Diagnosis not present

## 2022-05-16 DIAGNOSIS — R0602 Shortness of breath: Secondary | ICD-10-CM | POA: Diagnosis not present

## 2022-05-16 DIAGNOSIS — R918 Other nonspecific abnormal finding of lung field: Secondary | ICD-10-CM | POA: Diagnosis not present

## 2022-05-16 DIAGNOSIS — R059 Cough, unspecified: Secondary | ICD-10-CM | POA: Diagnosis not present

## 2022-05-16 DIAGNOSIS — J40 Bronchitis, not specified as acute or chronic: Secondary | ICD-10-CM | POA: Diagnosis not present

## 2022-05-16 DIAGNOSIS — R52 Pain, unspecified: Secondary | ICD-10-CM | POA: Diagnosis not present

## 2022-05-16 DIAGNOSIS — R051 Acute cough: Secondary | ICD-10-CM | POA: Diagnosis not present

## 2022-05-19 ENCOUNTER — Ambulatory Visit: Payer: Medicare Other | Admitting: Nutrition

## 2022-05-23 ENCOUNTER — Inpatient Hospital Stay (HOSPITAL_COMMUNITY)
Admission: EM | Admit: 2022-05-23 | Discharge: 2022-05-25 | DRG: 202 | Disposition: A | Discharge status: Home / Self Care | Payer: Medicare Other | Attending: Internal Medicine | Admitting: Internal Medicine

## 2022-05-23 ENCOUNTER — Emergency Department (HOSPITAL_COMMUNITY): Payer: Medicare Other

## 2022-05-23 ENCOUNTER — Inpatient Hospital Stay (HOSPITAL_COMMUNITY): Payer: Medicare Other

## 2022-05-23 ENCOUNTER — Encounter (HOSPITAL_COMMUNITY): Payer: Self-pay | Admitting: Emergency Medicine

## 2022-05-23 ENCOUNTER — Other Ambulatory Visit: Payer: Self-pay

## 2022-05-23 DIAGNOSIS — K589 Irritable bowel syndrome without diarrhea: Secondary | ICD-10-CM | POA: Diagnosis not present

## 2022-05-23 DIAGNOSIS — F32A Depression, unspecified: Secondary | ICD-10-CM | POA: Diagnosis not present

## 2022-05-23 DIAGNOSIS — J9811 Atelectasis: Secondary | ICD-10-CM | POA: Diagnosis not present

## 2022-05-23 DIAGNOSIS — J90 Pleural effusion, not elsewhere classified: Secondary | ICD-10-CM | POA: Diagnosis not present

## 2022-05-23 DIAGNOSIS — Z6838 Body mass index (BMI) 38.0-38.9, adult: Secondary | ICD-10-CM

## 2022-05-23 DIAGNOSIS — R0602 Shortness of breath: Secondary | ICD-10-CM | POA: Diagnosis not present

## 2022-05-23 DIAGNOSIS — Z79899 Other long term (current) drug therapy: Secondary | ICD-10-CM

## 2022-05-23 DIAGNOSIS — Z79631 Long term (current) use of antimetabolite agent: Secondary | ICD-10-CM

## 2022-05-23 DIAGNOSIS — R7303 Prediabetes: Secondary | ICD-10-CM | POA: Diagnosis not present

## 2022-05-23 DIAGNOSIS — K219 Gastro-esophageal reflux disease without esophagitis: Secondary | ICD-10-CM | POA: Diagnosis not present

## 2022-05-23 DIAGNOSIS — Z818 Family history of other mental and behavioral disorders: Secondary | ICD-10-CM

## 2022-05-23 DIAGNOSIS — M797 Fibromyalgia: Secondary | ICD-10-CM | POA: Diagnosis not present

## 2022-05-23 DIAGNOSIS — Z83438 Family history of other disorder of lipoprotein metabolism and other lipidemia: Secondary | ICD-10-CM

## 2022-05-23 DIAGNOSIS — Z8249 Family history of ischemic heart disease and other diseases of the circulatory system: Secondary | ICD-10-CM

## 2022-05-23 DIAGNOSIS — R739 Hyperglycemia, unspecified: Secondary | ICD-10-CM | POA: Insufficient documentation

## 2022-05-23 DIAGNOSIS — Z7984 Long term (current) use of oral hypoglycemic drugs: Secondary | ICD-10-CM

## 2022-05-23 DIAGNOSIS — F3162 Bipolar disorder, current episode mixed, moderate: Secondary | ICD-10-CM | POA: Diagnosis present

## 2022-05-23 DIAGNOSIS — J45901 Unspecified asthma with (acute) exacerbation: Principal | ICD-10-CM | POA: Diagnosis present

## 2022-05-23 DIAGNOSIS — Z880 Allergy status to penicillin: Secondary | ICD-10-CM

## 2022-05-23 DIAGNOSIS — R0609 Other forms of dyspnea: Secondary | ICD-10-CM | POA: Diagnosis not present

## 2022-05-23 DIAGNOSIS — M1990 Unspecified osteoarthritis, unspecified site: Secondary | ICD-10-CM | POA: Diagnosis present

## 2022-05-23 DIAGNOSIS — B338 Other specified viral diseases: Secondary | ICD-10-CM

## 2022-05-23 DIAGNOSIS — J4550 Severe persistent asthma, uncomplicated: Secondary | ICD-10-CM | POA: Diagnosis present

## 2022-05-23 DIAGNOSIS — Z7952 Long term (current) use of systemic steroids: Secondary | ICD-10-CM

## 2022-05-23 DIAGNOSIS — E876 Hypokalemia: Secondary | ICD-10-CM | POA: Diagnosis present

## 2022-05-23 DIAGNOSIS — E669 Obesity, unspecified: Secondary | ICD-10-CM | POA: Diagnosis present

## 2022-05-23 DIAGNOSIS — J4541 Moderate persistent asthma with (acute) exacerbation: Secondary | ICD-10-CM | POA: Diagnosis not present

## 2022-05-23 DIAGNOSIS — Z833 Family history of diabetes mellitus: Secondary | ICD-10-CM

## 2022-05-23 DIAGNOSIS — Z8701 Personal history of pneumonia (recurrent): Secondary | ICD-10-CM | POA: Diagnosis not present

## 2022-05-23 DIAGNOSIS — E782 Mixed hyperlipidemia: Secondary | ICD-10-CM | POA: Insufficient documentation

## 2022-05-23 DIAGNOSIS — Z20822 Contact with and (suspected) exposure to covid-19: Secondary | ICD-10-CM | POA: Diagnosis present

## 2022-05-23 DIAGNOSIS — R7302 Impaired glucose tolerance (oral): Secondary | ICD-10-CM | POA: Insufficient documentation

## 2022-05-23 DIAGNOSIS — Z888 Allergy status to other drugs, medicaments and biological substances status: Secondary | ICD-10-CM

## 2022-05-23 DIAGNOSIS — F4310 Post-traumatic stress disorder, unspecified: Secondary | ICD-10-CM | POA: Diagnosis present

## 2022-05-23 DIAGNOSIS — G4730 Sleep apnea, unspecified: Secondary | ICD-10-CM | POA: Diagnosis present

## 2022-05-23 DIAGNOSIS — Z91010 Allergy to peanuts: Secondary | ICD-10-CM

## 2022-05-23 DIAGNOSIS — M069 Rheumatoid arthritis, unspecified: Secondary | ICD-10-CM

## 2022-05-23 DIAGNOSIS — B974 Respiratory syncytial virus as the cause of diseases classified elsewhere: Secondary | ICD-10-CM | POA: Diagnosis present

## 2022-05-23 DIAGNOSIS — Z91018 Allergy to other foods: Secondary | ICD-10-CM

## 2022-05-23 LAB — BLOOD GAS, VENOUS
Acid-Base Excess: 5.6 mmol/L — ABNORMAL HIGH (ref 0.0–2.0)
Bicarbonate: 25.7 mmol/L (ref 20.0–28.0)
Drawn by: 7049
FIO2: 21 %
O2 Saturation: 84.2 %
Patient temperature: 36.4
pCO2, Ven: 24 mmHg — ABNORMAL LOW (ref 44–60)
pH, Ven: 7.63 (ref 7.25–7.43)
pO2, Ven: 41 mmHg (ref 32–45)

## 2022-05-23 LAB — BASIC METABOLIC PANEL
Anion gap: 7 (ref 5–15)
BUN: 14 mg/dL (ref 6–20)
CO2: 22 mmol/L (ref 22–32)
Calcium: 9.6 mg/dL (ref 8.9–10.3)
Chloride: 108 mmol/L (ref 98–111)
Creatinine, Ser: 0.83 mg/dL (ref 0.44–1.00)
GFR, Estimated: >60 mL/min (ref >60)
Glucose, Bld: 155 mg/dL — ABNORMAL HIGH (ref 70–99)
Potassium: 3.4 mmol/L — ABNORMAL LOW (ref 3.5–5.1)
Sodium: 137 mmol/L (ref 135–145)

## 2022-05-23 LAB — TROPONIN I (HIGH SENSITIVITY)
Troponin I (High Sensitivity): 2 ng/L (ref <18)
Troponin I (High Sensitivity): 2 ng/L (ref <18)

## 2022-05-23 LAB — CBC WITH DIFFERENTIAL/PLATELET
Abs Immature Granulocytes: 0.5 10*3/uL — ABNORMAL HIGH (ref 0.00–0.07)
Basophils Absolute: 0.1 10*3/uL (ref 0.0–0.1)
Basophils Relative: 1 %
Eosinophils Absolute: 0 10*3/uL (ref 0.0–0.5)
Eosinophils Relative: 0 %
HCT: 38.8 % (ref 36.0–46.0)
Hemoglobin: 12.6 g/dL (ref 12.0–15.0)
Immature Granulocytes: 5 %
Lymphocytes Relative: 29 %
Lymphs Abs: 3.3 10*3/uL (ref 0.7–4.0)
MCH: 29.4 pg (ref 26.0–34.0)
MCHC: 32.5 g/dL (ref 30.0–36.0)
MCV: 90.4 fL (ref 80.0–100.0)
Monocytes Absolute: 0.4 10*3/uL (ref 0.1–1.0)
Monocytes Relative: 4 %
Neutro Abs: 6.8 10*3/uL (ref 1.7–7.7)
Neutrophils Relative %: 61 %
Platelets: 280 10*3/uL (ref 150–400)
RBC: 4.29 MIL/uL (ref 3.87–5.11)
RDW: 16.4 % — ABNORMAL HIGH (ref 11.5–15.5)
WBC: 11.1 10*3/uL — ABNORMAL HIGH (ref 4.0–10.5)
nRBC: 0 % (ref 0.0–0.2)

## 2022-05-23 LAB — PROCALCITONIN: Procalcitonin: <0.1 ng/mL

## 2022-05-23 LAB — ECHOCARDIOGRAM COMPLETE
Area-P 1/2: 2.24 cm2
Height: 67 in
S' Lateral: 2.6 cm
Weight: 3896 oz

## 2022-05-23 LAB — RESP PANEL BY RT-PCR (FLU A&B, COVID) ARPGX2
Influenza A by PCR: NEGATIVE
Influenza B by PCR: NEGATIVE
SARS Coronavirus 2 by RT PCR: NEGATIVE

## 2022-05-23 LAB — HEMOGLOBIN A1C
Hgb A1c MFr Bld: 6.3 % — ABNORMAL HIGH (ref 4.8–5.6)
Mean Plasma Glucose: 134.11 mg/dL

## 2022-05-23 LAB — TSH: TSH: 1.668 u[IU]/mL (ref 0.350–4.500)

## 2022-05-23 LAB — BRAIN NATRIURETIC PEPTIDE: B Natriuretic Peptide: 46 pg/mL (ref 0.0–100.0)

## 2022-05-23 MED ORDER — METHOTREXATE SODIUM 2.5 MG PO TABS
25.0000 mg | ORAL_TABLET | ORAL | Status: DC
  Filled 2022-05-23: qty 10

## 2022-05-23 MED ORDER — METHYLPREDNISOLONE SODIUM SUCC 125 MG IJ SOLR
60.0000 mg | Freq: Two times a day (BID) | INTRAMUSCULAR | Status: DC
  Administered 2022-05-23 – 2022-05-25 (×4): 60 mg via INTRAVENOUS
  Filled 2022-05-23 (×4): qty 2

## 2022-05-23 MED ORDER — IOHEXOL 350 MG/ML SOLN
80.0000 mL | Freq: Once | INTRAVENOUS | Status: AC | PRN
  Administered 2022-05-23: 80 mL via INTRAVENOUS

## 2022-05-23 MED ORDER — ALLOPURINOL 100 MG PO TABS
300.0000 mg | ORAL_TABLET | Freq: Every day | ORAL | Status: DC
  Administered 2022-05-23 – 2022-05-25 (×3): 300 mg via ORAL
  Filled 2022-05-23 (×3): qty 3

## 2022-05-23 MED ORDER — MIRABEGRON ER 25 MG PO TB24
25.0000 mg | ORAL_TABLET | Freq: Every day | ORAL | Status: DC
  Administered 2022-05-23 – 2022-05-25 (×3): 25 mg via ORAL
  Filled 2022-05-23 (×3): qty 1

## 2022-05-23 MED ORDER — PRAZOSIN HCL 1 MG PO CAPS
5.0000 mg | ORAL_CAPSULE | Freq: Two times a day (BID) | ORAL | Status: DC
  Administered 2022-05-23 – 2022-05-24 (×4): 5 mg via ORAL
  Filled 2022-05-23 (×9): qty 5

## 2022-05-23 MED ORDER — IPRATROPIUM-ALBUTEROL 0.5-2.5 (3) MG/3ML IN SOLN
3.0000 mL | Freq: Once | RESPIRATORY_TRACT | Status: AC
  Administered 2022-05-23: 3 mL via RESPIRATORY_TRACT
  Filled 2022-05-23: qty 3

## 2022-05-23 MED ORDER — HYDROXYZINE HCL 25 MG PO TABS
25.0000 mg | ORAL_TABLET | Freq: Two times a day (BID) | ORAL | Status: DC | PRN
  Administered 2022-05-23 – 2022-05-24 (×2): 25 mg via ORAL
  Filled 2022-05-23 (×2): qty 1

## 2022-05-23 MED ORDER — ATORVASTATIN CALCIUM 40 MG PO TABS
40.0000 mg | ORAL_TABLET | Freq: Every day | ORAL | Status: DC
  Administered 2022-05-23 – 2022-05-25 (×3): 40 mg via ORAL
  Filled 2022-05-23 (×3): qty 1

## 2022-05-23 MED ORDER — ALUM & MAG HYDROXIDE-SIMETH 200-200-20 MG/5ML PO SUSP
15.0000 mL | Freq: Four times a day (QID) | ORAL | Status: DC | PRN
  Administered 2022-05-23 – 2022-05-24 (×2): 15 mL via ORAL
  Filled 2022-05-23 (×2): qty 30

## 2022-05-23 MED ORDER — LURASIDONE HCL 40 MG PO TABS
40.0000 mg | ORAL_TABLET | Freq: Every day | ORAL | Status: DC
  Administered 2022-05-23 – 2022-05-24 (×2): 40 mg via ORAL
  Filled 2022-05-23 (×2): qty 1

## 2022-05-23 MED ORDER — ONDANSETRON HCL 4 MG PO TABS
4.0000 mg | ORAL_TABLET | Freq: Four times a day (QID) | ORAL | Status: DC | PRN
  Administered 2022-05-24: 4 mg via ORAL
  Filled 2022-05-23: qty 1

## 2022-05-23 MED ORDER — FOLIC ACID 1 MG PO TABS
1.0000 mg | ORAL_TABLET | Freq: Every day | ORAL | Status: DC
  Administered 2022-05-23 – 2022-05-25 (×3): 1 mg via ORAL
  Filled 2022-05-23 (×3): qty 1

## 2022-05-23 MED ORDER — POTASSIUM CHLORIDE CRYS ER 20 MEQ PO TBCR
40.0000 meq | EXTENDED_RELEASE_TABLET | Freq: Once | ORAL | Status: AC
  Administered 2022-05-23: 40 meq via ORAL
  Filled 2022-05-23: qty 2

## 2022-05-23 MED ORDER — ALPRAZOLAM 0.5 MG PO TABS
0.5000 mg | ORAL_TABLET | Freq: Three times a day (TID) | ORAL | Status: DC | PRN
  Administered 2022-05-23 – 2022-05-24 (×4): 0.5 mg via ORAL
  Filled 2022-05-23 (×4): qty 1

## 2022-05-23 MED ORDER — IPRATROPIUM-ALBUTEROL 0.5-2.5 (3) MG/3ML IN SOLN
3.0000 mL | Freq: Four times a day (QID) | RESPIRATORY_TRACT | Status: DC
  Administered 2022-05-23 (×2): 3 mL via RESPIRATORY_TRACT
  Filled 2022-05-23 (×3): qty 3

## 2022-05-23 MED ORDER — ACETAMINOPHEN 325 MG PO TABS
650.0000 mg | ORAL_TABLET | Freq: Four times a day (QID) | ORAL | Status: DC | PRN
  Administered 2022-05-24 – 2022-05-25 (×2): 650 mg via ORAL
  Filled 2022-05-23 (×2): qty 2

## 2022-05-23 MED ORDER — METHYLPREDNISOLONE SODIUM SUCC 125 MG IJ SOLR
125.0000 mg | Freq: Once | INTRAMUSCULAR | Status: AC
  Administered 2022-05-23: 125 mg via INTRAVENOUS
  Filled 2022-05-23: qty 2

## 2022-05-23 MED ORDER — COLCHICINE 0.6 MG PO TABS
0.6000 mg | ORAL_TABLET | Freq: Every day | ORAL | Status: DC
  Administered 2022-05-23 – 2022-05-25 (×3): 0.6 mg via ORAL
  Filled 2022-05-23 (×3): qty 1

## 2022-05-23 MED ORDER — VITAMIN B-12 100 MCG PO TABS
100.0000 ug | ORAL_TABLET | Freq: Every day | ORAL | Status: DC
  Administered 2022-05-23 – 2022-05-25 (×3): 100 ug via ORAL
  Filled 2022-05-23 (×3): qty 1

## 2022-05-23 MED ORDER — FUROSEMIDE 10 MG/ML IJ SOLN
20.0000 mg | Freq: Once | INTRAMUSCULAR | Status: AC
  Administered 2022-05-23: 20 mg via INTRAVENOUS
  Filled 2022-05-23: qty 2

## 2022-05-23 MED ORDER — ALBUTEROL (5 MG/ML) CONTINUOUS INHALATION SOLN
10.0000 mg/h | INHALATION_SOLUTION | Freq: Once | RESPIRATORY_TRACT | Status: AC
  Administered 2022-05-23: 10 mg/h via RESPIRATORY_TRACT
  Filled 2022-05-23: qty 20

## 2022-05-23 MED ORDER — ALBUTEROL SULFATE (2.5 MG/3ML) 0.083% IN NEBU: 2.5000 mg | INHALATION_SOLUTION | RESPIRATORY_TRACT | Status: DC | PRN

## 2022-05-23 MED ORDER — ALPRAZOLAM 0.5 MG PO TABS
0.5000 mg | ORAL_TABLET | Freq: Once | ORAL | Status: AC
  Administered 2022-05-23: 0.5 mg via ORAL
  Filled 2022-05-23: qty 1

## 2022-05-23 MED ORDER — ENOXAPARIN SODIUM 60 MG/0.6ML IJ SOSY
50.0000 mg | PREFILLED_SYRINGE | INTRAMUSCULAR | Status: DC
  Administered 2022-05-23 – 2022-05-24 (×2): 50 mg via SUBCUTANEOUS
  Filled 2022-05-23 (×2): qty 0.6

## 2022-05-23 MED ORDER — ONDANSETRON HCL 4 MG/2ML IJ SOLN: 4.0000 mg | Freq: Four times a day (QID) | INTRAMUSCULAR | Status: DC | PRN

## 2022-05-23 MED ORDER — LORAZEPAM 2 MG/ML IJ SOLN
0.5000 mg | Freq: Once | INTRAMUSCULAR | Status: AC
  Administered 2022-05-23: 0.5 mg via INTRAVENOUS
  Filled 2022-05-23: qty 1

## 2022-05-23 MED ORDER — ACETAMINOPHEN 650 MG RE SUPP: 650.0000 mg | Freq: Four times a day (QID) | RECTAL | Status: DC | PRN

## 2022-05-23 MED ORDER — ALBUTEROL SULFATE (2.5 MG/3ML) 0.083% IN NEBU
INHALATION_SOLUTION | RESPIRATORY_TRACT | Status: AC
  Filled 2022-05-23: qty 12

## 2022-05-23 MED ORDER — DULOXETINE HCL 60 MG PO CPEP
60.0000 mg | ORAL_CAPSULE | Freq: Every day | ORAL | Status: DC
  Administered 2022-05-23 – 2022-05-25 (×3): 60 mg via ORAL
  Filled 2022-05-23 (×3): qty 1

## 2022-05-23 MED ORDER — OXCARBAZEPINE 300 MG PO TABS
600.0000 mg | ORAL_TABLET | Freq: Every day | ORAL | Status: DC
  Administered 2022-05-23 – 2022-05-24 (×2): 600 mg via ORAL
  Filled 2022-05-23 (×2): qty 2

## 2022-05-23 MED ORDER — ALBUTEROL SULFATE HFA 108 (90 BASE) MCG/ACT IN AERS: 2.0000 | INHALATION_SPRAY | RESPIRATORY_TRACT | Status: DC | PRN

## 2022-05-23 MED ORDER — MAGNESIUM SULFATE 2 GM/50ML IV SOLN
2.0000 g | Freq: Once | INTRAVENOUS | Status: AC
Start: 2022-05-23 — End: 2022-05-23
  Administered 2022-05-23: 2 g via INTRAVENOUS
  Filled 2022-05-23: qty 50

## 2022-05-23 MED ORDER — BUDESONIDE 0.5 MG/2ML IN SUSP
0.5000 mg | Freq: Two times a day (BID) | RESPIRATORY_TRACT | Status: DC
  Administered 2022-05-23 – 2022-05-25 (×5): 0.5 mg via RESPIRATORY_TRACT
  Filled 2022-05-23 (×4): qty 2

## 2022-05-23 NOTE — Assessment & Plan Note (Addendum)
Continue IV Solu-Medrol Continue DuoNebs -continue Pulmicort -Certainly some of her dyspnea is being driven by her anxiety>> restart alprazolam -Check viral respiratory panel +RSV -much of her sob is also being driven by her anxiety>>xanax 0.5 mg tid prn

## 2022-05-23 NOTE — ED Provider Notes (Signed)
  Physical Exam  BP 134/73 (BP Location: Right Arm)   Pulse (!) 105   Temp 97.8 F (36.6 C)   Resp (!) 22   Ht '5\' 7"'$  (1.702 m)   Wt 108 kg   SpO2 100%   BMI 37.28 kg/m   Physical Exam Vitals and nursing note reviewed.  Constitutional:      General: She is not in acute distress.    Appearance: She is well-developed.  HENT:     Head: Normocephalic and atraumatic.  Eyes:     Conjunctiva/sclera: Conjunctivae normal.  Cardiovascular:     Rate and Rhythm: Normal rate and regular rhythm.     Heart sounds: No murmur heard. Pulmonary:     Effort: Pulmonary effort is normal. Tachypnea present. No respiratory distress.     Breath sounds: Wheezing present.  Musculoskeletal:        General: No swelling.     Cervical back: Neck supple.  Skin:    General: Skin is warm and dry.     Capillary Refill: Capillary refill takes less than 2 seconds.  Neurological:     Mental Status: She is alert.  Psychiatric:        Mood and Affect: Mood normal.     Procedures  .Critical Care  Performed by: Teressa Lower, MD Authorized by: Teressa Lower, MD   Critical care provider statement:    Critical care time (minutes):  30   Critical care was necessary to treat or prevent imminent or life-threatening deterioration of the following conditions:  Respiratory failure   Critical care was time spent personally by me on the following activities:  Development of treatment plan with patient or surrogate, discussions with consultants, evaluation of patient's response to treatment, examination of patient, ordering and review of laboratory studies, ordering and review of radiographic studies, ordering and performing treatments and interventions, pulse oximetry, re-evaluation of patient's condition and review of old charts   ED Course / MDM    Medical Decision Making Amount and/or Complexity of Data Reviewed Labs: ordered. Radiology: ordered.  Risk Prescription drug management. Decision regarding  hospitalization.   Patient received in handoff.  Shortness of breath with wheezing and tachypnea.  Pending reevaluation after DuoNeb completion.  On reevaluation, patient remains persistently tachypneic with wheezing and she ultimately completed an additional hour-long continuous albuterol treatment.  On my reevaluation, patient appears significantly anxious and her wheezing is starting to improve but remains persistent with tachypnea.  We expanded her work-up with a PE study given persistent tachypnea of unknown origin which shows small bilateral pleural effusions, a thyroid nodule but no PE or pneumonia.  Patient will require hospital admission given persistent tachypnea and wheezing requiring every 4 hour albuterol treatments.  Patient then admitted       Teressa Lower, MD 05/23/22 1444

## 2022-05-23 NOTE — Assessment & Plan Note (Addendum)
Check hemoglobin A1C--6.3 NovoLog sliding scale

## 2022-05-23 NOTE — Hospital Course (Signed)
51 year old female with a history of asthma, anxiety, depression, fibromyalgia, PTSD presenting with 1 month history of shortness of breath.  She went to see her PCP a little over 1 week prior to this admission on 05/16/2022.  The patient was given a 10-day prescription for levofloxacin and a prescription for prednisone.  She stated that her breathing continue to worsen, but more significantly in the last 2 days prior to admission.  She complains of a nonproductive cough.  She denies any chest pain, nausea, vomiting, diarrhea, abdominal pain.  She states that her son is sick with some URI type symptoms.  She denies any recent travels.  She does not smoke currently. In the ED, the patient was afebrile and hemodynamically stable with oxygen saturation 100% room air.  CBC showed WBC 11.1, hemoglobin 12.6, platelets 280,000.  Sodium 137, potassium 3.4, bicarbonate 22, serum creatinine 0.83.  Troponin 2>> 2.  EKG shows sinus rhythm with no ST-T wave changes.  CTA chest was negative for PE but showed small bilateral pleural effusions and right upper lobe nodule.  VBG showed 7.6 10/09/39/25 on room air.  The patient was given an hour-long nebulizer treatment with albuterol and Solu-Medrol.  She continued to have shortness of breath and wheezing.  She was admitted for further evaluation and treatment.

## 2022-05-23 NOTE — ED Triage Notes (Signed)
Pt here for c/o worsening SOB x 2 days. States she was dx with PNA on Friday. Currently taking ABX and Prednisone and using her inhaler without relief of symptoms.

## 2022-05-23 NOTE — Progress Notes (Signed)
Pt arrived to unit 300 by staff. Pt is found lying in bed with labored breathing. O2 sat is 100%. Pt is visibly sweating with a lot of anxiety. Pt continues to state she can't breathe but O2 sat is 100%. Directed pt to take slow deep breaths in through her nose and out through her mouth but with no relief. MD made aware. No new orders at this time. Will continue to monitor pt.

## 2022-05-23 NOTE — Assessment & Plan Note (Signed)
Continue statin. 

## 2022-05-23 NOTE — H&P (Signed)
History and Physical    Patient: [name] DOB: 03/29/1971 DOA: 05/23/2022 DOS: the patient was seen and examined on 05/23/2022 PCP: Monico Blitz, MD  Patient coming from: Home  Chief Complaint:  Chief Complaint  Patient presents with   Shortness of Breath   HPI: [name] is a 51 year old female with a history of asthma, anxiety, depression, fibromyalgia, PTSD presenting with 1 month history of shortness of breath.  She went to see her PCP a little over 1 week prior to this admission on 05/16/2022.  The patient was given a 10-day prescription for levofloxacin and a prescription for prednisone.  She stated that her breathing continue to worsen, but more significantly in the last 2 days prior to admission.  She complains of a nonproductive cough.  She denies any chest pain, nausea, vomiting, diarrhea, abdominal pain.  She states that her son is sick with some URI type symptoms.  She denies any recent travels.  She does not smoke currently. In the ED, the patient was afebrile and hemodynamically stable with oxygen saturation 100% room air.  CBC showed WBC 11.1, hemoglobin 12.6, platelets 280,000.  Sodium 137, potassium 3.4, bicarbonate 22, serum creatinine 0.83.  Troponin 2>> 2.  EKG shows sinus rhythm with no ST-T wave changes.  CTA chest was negative for PE but showed small bilateral pleural effusions and right upper lobe nodule.  VBG showed 7.6 10/09/39/25 on room air.  The patient was given an hour-long nebulizer treatment with albuterol and Solu-Medrol.  She continued to have shortness of breath and wheezing.  She was admitted for further evaluation and treatment.  Review of Systems: As mentioned in the history of present illness. All other systems reviewed and are negative. Past Medical History:  Diagnosis Date   Anemia    Anxiety    Arthritis    orthopedic   Bipolar disorder (HCC)    Depression    Fibromyalgia    GERD (gastroesophageal reflux disease)     Hypotension    IBS (irritable bowel syndrome)    Migraine    PTSD (post-traumatic stress disorder)    on prazosin   Sleep apnea    Tonsillar hypertrophy    Uterine fibroid    Past Surgical History:  Procedure Laterality Date   ENDOMETRIAL ABLATION     TONSILLECTOMY AND ADENOIDECTOMY N/A 02/13/2018   Procedure: TONSILLECTOMY AND ADENOIDECTOMY;  Surgeon: Leta Baptist, MD;  Location: [facility];  Service: ENT;  Laterality: N/A;   TUBAL LIGATION     Social History:  reports that she has never smoked. She has never used smokeless tobacco. She reports that she does not drink alcohol and does not use drugs.  Allergies  Allergen Reactions   Peanut-Containing Drug Products Anaphylaxis    Swelling    Tegretol [Carbamazepine] Shortness Of Breath and Swelling   Lithium Other (See Comments)    Bad acne    Sweet Potato Itching   Penicillins Rash    Family History  Problem Relation Age of Onset   Depression Mother    Hypertension Mother    Hyperlipidemia Mother    Other Mother        bone degeneration   Depression Brother    Alcohol abuse Brother    Alcohol abuse Brother    Hypertension Father    Hyperlipidemia Father    Heart attack Father    Diabetes Father    Bipolar disorder Son    ADD / ADHD Son  Bipolar disorder Son    ADD / ADHD Son    Asperger's syndrome Son    Bipolar disorder Daughter     Prior to Admission medications   Medication Sig Start Date End Date Taking? Authorizing Provider  albuterol (VENTOLIN HFA) 108 (90 Base) MCG/ACT inhaler Inhale 1-2 puffs into the lungs every 6 (six) hours as needed for wheezing or shortness of breath. 05/11/22  Yes [provider]  allopurinol (ZYLOPRIM) 300 MG tablet Take 300 mg by mouth daily. 05/11/22  Yes [provider]  ALPRAZolam [name]) 0.5 MG tablet Take 0.5 mg by mouth 2 (two) times daily as needed for anxiety. 06/29/19  Yes [provider]  atorvastatin (LIPITOR) 40 MG tablet  Take 40 mg by mouth daily.   Yes [provider]  colchicine 0.6 MG tablet Take 0.6 mg by mouth daily. 05/11/22  Yes [provider]  DULoxetine (CYMBALTA) 60 MG capsule Take 60 mg by mouth daily. 06/29/19  Yes [provider]  folic acid (FOLVITE) 1 MG tablet Take 1 mg by mouth daily.   Yes [provider]  furosemide (LASIX) 20 MG tablet Take 20 mg by mouth.   Yes [provider]  hydrOXYzine (ATARAX) 50 MG tablet Take 25 mg by mouth 2 (two) times daily as needed for itching.   Yes [provider]  LATUDA 40 MG TABS tablet Take 40 mg by mouth at bedtime. 06/29/19  Yes [provider]  levocetirizine (XYZAL) 5 MG tablet Take 5 mg by mouth daily. 05/11/22  Yes [provider]  levofloxacin (LEVAQUIN) 500 MG tablet Take 500 mg by mouth daily. 05/16/22  Yes [provider]  metFORMIN (GLUCOPHAGE) 500 MG tablet Take 500 mg by mouth daily with breakfast.   Yes [provider]  methotrexate 2.5 MG tablet Take 25 mg by mouth once a week. Caution:Chemotherapy. Protect from light.  Takes 10 per day once a week.   Yes [provider]  mirabegron ER (MYRBETRIQ) 25 MG TB24 tablet Take 25 mg by mouth daily.   Yes [provider]  Oxcarbazepine (TRILEPTAL) 300 MG tablet Take 1 tablet (300 mg total) by mouth 2 (two) times daily. Patient taking differently: Take 600 mg by mouth at bedtime. 11/07/13  Yes [name], MD  pantoprazole sodium (PROTONIX) 40 mg Place into feeding tube daily.   Yes [provider]  prazosin (MINIPRESS) 5 MG capsule Take 5 mg by mouth 2 (two) times daily.   Yes [provider]  predniSONE (DELTASONE) 5 MG tablet Take 5 mg by mouth as directed. 05/21/22  Yes [provider]  pregabalin (LYRICA) 75 MG capsule Take 1 capsule by mouth daily. 06/29/19  Yes [provider]  vitamin B-12 (CYANOCOBALAMIN) 100 MCG tablet Take 100 mcg by mouth daily.    Yes [provider]    Physical Exam: Vitals:   05/23/22 1000 05/23/22 1131 05/23/22 1313 05/23/22 1335  BP:  129/77    Pulse: 93 89 98 (!) 108  Resp: (!) 25 (!) 22 (!) 23 (!) 23  Temp:  97.8 F (36.6 C)    TempSrc:      SpO2: 100% 99% 100% 100%  Weight:      Height:       GENERAL:  A&O x 3, NAD, well developed, cooperative, follows commands HEENT: [place]/AT, No thrush, No icterus, No oral ulcers Neck:  No neck mass, No meningismus, soft, supple CV: RRR, no S3, no S4, no rub, no  JVD Lungs:  bilateral exp wheeze.  Bilateral rales Abd: soft/NT +BS, nondistended Ext: No edema, no lymphangitis, no cyanosis, no rashes Neuro:  CN II-XII intact, strength 4/5 in RUE, RLE, strength 4/5 LUE, LLE; sensation intact bilateral; no dysmetria; babinski equivocal  Data Reviewed: Data reviewed above in history  Assessment and Plan: * Asthma exacerbation Continue IV Solu-Medrol Continue DuoNebs -Start Pulmicort -Certainly some of her dyspnea is being driven by her anxiety>> restart alprazolam -Check viral respiratory panel  Mixed hyperlipidemia Continue statin  Hyperglycemia Check hemoglobin  NovoLog sliding scale  Bipolar I disorder, most recent episode (or current) mixed, moderate Continue Latuda, Cymbalta, Trileptal      Advance Care Planning: FULL  Consults: none  Family Communication: mother updated 11/6  Severity of Illness: The appropriate patient status for this patient is INPATIENT. Inpatient status is judged to be reasonable and necessary in order to provide the required intensity of service to ensure the patient's safety. The patient's presenting symptoms, physical exam findings, and initial radiographic and laboratory data in the context of their chronic comorbidities is felt to place them at high risk for further clinical deterioration. Furthermore, it is not anticipated that the patient will be medically stable for discharge from the hospital within 2  midnights of admission.   * I certify that at the point of admission it is my clinical judgment that the patient will require inpatient hospital care spanning beyond 2 midnights from the point of admission due to high intensity of service, high risk for further deterioration and high frequency of surveillance required.*  Author: Orson Eva, MD 05/23/2022 1:37 PM  For on call review [email or web address].

## 2022-05-23 NOTE — Progress Notes (Signed)
*  PRELIMINARY RESULTS* Echocardiogram 2D Echocardiogram has been performed.  [name] 05/23/2022, 4:37 PM

## 2022-05-23 NOTE — ED Provider Notes (Signed)
[facility] EMERGENCY DEPARTMENT Provider Note   CSN: [id number] Arrival date & time: 05/23/22  0548     History  Chief Complaint  Patient presents with   Shortness of Breath    [name] is a 51 y.o. female.  Patient is a 51 year old female with past medical history of bipolar disorder, fibromyalgia, asthma.  Patient with recent diagnosis of pneumonia and just finished taking prednisone and Zithromax.  She presents today with complaints of shortness of breath that has been worsening over the past 2 days.  She describes wheezing and difficulty breathing that is unrelieved with her inhalers.  She denies to me she is having any chest pain or leg swelling.  The history is provided by the patient.       Home Medications Prior to Admission medications   Medication Sig Start Date End Date Taking? Authorizing Provider  ALPRAZolam [name]) 0.5 MG tablet Take 0.5 mg by mouth 2 (two) times daily as needed. 06/29/19   [provider]  alprazolam [name]) 2 MG tablet Take 1 tablet (2 mg total) by mouth 3 (three) times daily as needed. 11/07/13   [name], MD  atorvastatin (LIPITOR) 40 MG tablet Take 40 mg by mouth daily.    [provider]  B Complex-C-E-Zn (B COMPLEX-C-E-ZINC) tablet Take 1 tablet by mouth daily. Patient not taking: Reported on 11/24/2021    [provider]  BELSOMRA 20 MG TABS Take 1 tablet by mouth at bedtime as needed. Patient not taking: Reported on 11/24/2021 06/29/19   [provider]  diclofenac Sodium (VOLTAREN) 1 % GEL Apply 4 g topically 4 (four) times daily. Patient not taking: Reported on 11/24/2021    [provider]  doxycycline (VIBRA-TABS) 100 MG tablet Take 1 tablet (100 mg total) by mouth 2 (two) times daily. 04/12/21   [name], DPM  DULoxetine (CYMBALTA) 60 MG capsule Take 60 mg by mouth daily. 06/29/19   [provider]  folic acid (FOLVITE) 1 MG tablet Take 1 mg by mouth daily.     [provider]  furosemide (LASIX) 20 MG tablet Take 20 mg by mouth.    [provider]  gabapentin (NEURONTIN) 600 MG tablet Take 1 tablet by mouth daily. For migraines Patient not taking: Reported on 11/24/2021 06/29/19   [provider]  hydrOXYzine (ATARAX) 50 MG tablet Take 50 mg by mouth 3 (three) times daily as needed.    [provider]  LATUDA 40 MG TABS tablet Take 40 mg by mouth at bedtime. 06/29/19   [provider]  metFORMIN (GLUCOPHAGE-XR) 750 MG 24 hr tablet Take 750 mg by mouth daily with breakfast.    [provider]  methotrexate 2.5 MG tablet Take 2.5 mg by mouth once a week. Caution:Chemotherapy. Protect from light.  Takes 10 per day once a week.    [provider]  norethindrone (MICRONOR) 0.35 MG tablet Take 1 tablet (0.35 mg total) by mouth daily. 05/06/19   [name], MD  oseltamivir (TAMIFLU) 75 MG capsule Take 1 capsule (75 mg total) by mouth every 12 (twelve) hours. 07/22/21   [name], MD  Oxcarbazepine (TRILEPTAL) 300 MG tablet Take 1 tablet (300 mg total) by mouth 2 (two) times daily. Patient taking differently: Take 600 mg by mouth 2 (two) times daily. 11/07/13   [name], MD  pantoprazole sodium (PROTONIX) 40 mg Place into feeding tube daily.    [provider]  prazosin (  MINIPRESS) 5 MG capsule Take 5 mg by mouth 2 (two) times daily.    [provider]  pregabalin (LYRICA) 75 MG capsule Take 1 capsule by mouth daily. 06/29/19   [provider]  vitamin B-12 (CYANOCOBALAMIN) 100 MCG tablet Take 100 mcg by mouth daily.    [provider]      Allergies    Peanut-containing drug products, Tegretol [carbamazepine], Lithium, Sweet potato, and Penicillins    Review of Systems   Review of Systems  All other systems reviewed and are negative.   Physical Exam Updated Vital Signs BP 118/89   Pulse 77   Temp (!) 97.5 F (36.4 C) (Oral)    Resp (!) 33   Ht 5' 7.5" (1.715 m)   Wt 108 kg   SpO2 100%   BMI 36.73 kg/m  Physical Exam Vitals and nursing note reviewed.  Constitutional:      General: She is not in acute distress.    Appearance: She is well-developed. She is not diaphoretic.  HENT:     Head: Normocephalic and atraumatic.  Cardiovascular:     Rate and Rhythm: Normal rate and regular rhythm.     Heart sounds: No murmur heard.    No friction rub. No gallop.  Pulmonary:     Effort: Pulmonary effort is normal. Tachypnea present. No respiratory distress.     Breath sounds: Examination of the right-middle field reveals rhonchi. Examination of the left-middle field reveals rhonchi. Rhonchi present. No wheezing.     Comments: Patient is tachypneic, but has adequate oxygen saturations Abdominal:     General: Bowel sounds are normal. There is no distension.     Palpations: Abdomen is soft.     Tenderness: There is no abdominal tenderness.  Musculoskeletal:        General: Normal range of motion.     Cervical back: Normal range of motion and neck supple.     Right lower leg: No tenderness. No edema.     Left lower leg: No tenderness. No edema.  Skin:    General: Skin is warm and dry.  Neurological:     General: No focal deficit present.     Mental Status: She is alert and oriented to person, place, and time.     ED Results / Procedures / Treatments   Labs (all labs ordered are listed, but only abnormal results are displayed) Labs Reviewed  BASIC METABOLIC PANEL  CBC WITH DIFFERENTIAL/PLATELET  BRAIN NATRIURETIC PEPTIDE  TROPONIN I (HIGH SENSITIVITY)    EKG None  Radiology No results found.  Procedures Procedures  {Document cardiac monitor, telemetry assessment procedure when appropriate:1}  Medications Ordered in ED Medications  albuterol (VENTOLIN HFA) 108 (90 Base) MCG/ACT inhaler 2 puff (has no administration in time range)  methylPREDNISolone sodium succinate (SOLU-MEDROL) 125 mg/2 mL  injection 125 mg (has no administration in time range)  ipratropium-albuterol (DUONEB) 0.5-2.5 (3) MG/3ML nebulizer solution 3 mL (has no administration in time range)  magnesium sulfate IVPB 2 g 50 mL (has no administration in time range)  ipratropium-albuterol (DUONEB) 0.5-2.5 (3) MG/3ML nebulizer solution 3 mL (has no administration in time range)    ED Course/ Medical Decision Making/ A&P                           Medical Decision Making Amount and/or Complexity of Data Reviewed Labs: ordered. Radiology: ordered.  Risk Prescription drug management.   ***  {Document critical  care time when appropriate:1} {Document review of labs and clinical decision tools ie heart score, Chads2Vasc2 etc:1}  {Document your independent review of radiology images, and any outside records:1} {Document your discussion with family members, caretakers, and with consultants:1} {Document social determinants of health affecting pt's care:1} {Document your decision making why or why not admission, treatments were needed:1} Final Clinical Impression(s) / ED Diagnoses Final diagnoses:  None    Rx / DC Orders ED Discharge Orders     None

## 2022-05-23 NOTE — Assessment & Plan Note (Signed)
Continue Latuda, Cymbalta, Trileptal

## 2022-05-23 NOTE — ED Notes (Signed)
Date and time results received: 05/23/22 8:11 AM  (use smartphrase ".now" to insert current time)  Test: Venous Ph Critical Value: 7.63  Name of Provider Notified: Dr. Matilde Sprang  Orders Received? Or Actions Taken?: see chart

## 2022-05-24 DIAGNOSIS — R7302 Impaired glucose tolerance (oral): Secondary | ICD-10-CM | POA: Insufficient documentation

## 2022-05-24 LAB — RESPIRATORY PANEL BY PCR

## 2022-05-24 LAB — BASIC METABOLIC PANEL
Anion gap: 8 (ref 5–15)
BUN: 18 mg/dL (ref 6–20)
CO2: 23 mmol/L (ref 22–32)
Calcium: 9.6 mg/dL (ref 8.9–10.3)
Chloride: 106 mmol/L (ref 98–111)
Creatinine, Ser: 0.74 mg/dL (ref 0.44–1.00)
GFR, Estimated: >60 mL/min (ref >60)
Glucose, Bld: 167 mg/dL — ABNORMAL HIGH (ref 70–99)
Potassium: 4.4 mmol/L (ref 3.5–5.1)
Sodium: 137 mmol/L (ref 135–145)

## 2022-05-24 LAB — CBC
HCT: 39.3 % (ref 36.0–46.0)
Hemoglobin: 12.7 g/dL (ref 12.0–15.0)
MCH: 29.3 pg (ref 26.0–34.0)
MCHC: 32.3 g/dL (ref 30.0–36.0)
MCV: 90.8 fL (ref 80.0–100.0)
Platelets: 300 10*3/uL (ref 150–400)
RBC: 4.33 MIL/uL (ref 3.87–5.11)
RDW: 16.4 % — ABNORMAL HIGH (ref 11.5–15.5)
WBC: 23.2 10*3/uL — ABNORMAL HIGH (ref 4.0–10.5)
nRBC: 0 % (ref 0.0–0.2)

## 2022-05-24 LAB — GLUCOSE, CAPILLARY
Glucose-Capillary: 191 mg/dL — ABNORMAL HIGH (ref 70–99)
Glucose-Capillary: 219 mg/dL — ABNORMAL HIGH (ref 70–99)

## 2022-05-24 LAB — MAGNESIUM: Magnesium: 2.3 mg/dL (ref 1.7–2.4)

## 2022-05-24 LAB — HIV ANTIBODY (ROUTINE TESTING W REFLEX): HIV Screen 4th Generation wRfx: NONREACTIVE

## 2022-05-24 MED ORDER — IPRATROPIUM-ALBUTEROL 0.5-2.5 (3) MG/3ML IN SOLN
3.0000 mL | Freq: Three times a day (TID) | RESPIRATORY_TRACT | Status: DC
  Administered 2022-05-24 – 2022-05-25 (×5): 3 mL via RESPIRATORY_TRACT
  Filled 2022-05-24 (×5): qty 3

## 2022-05-24 MED ORDER — INSULIN ASPART 100 UNIT/ML IJ SOLN
0.0000 [IU] | Freq: Three times a day (TID) | INTRAMUSCULAR | Status: DC
  Administered 2022-05-24 – 2022-05-25 (×3): 2 [IU] via SUBCUTANEOUS

## 2022-05-24 NOTE — TOC Progression Note (Signed)
  Transition of Care (TOC) Screening Note   Patient Details  Name: [name] Date of Birth: 02-27-1971   Transition of Care [facility]) CM/SW Contact:    Shade Flood, LCSW Phone Number: 05/24/2022, 8:35 AM    Transition of Care Department [facility]) has reviewed patient and no TOC needs have been identified at this time. We will continue to monitor patient advancement through interdisciplinary progression rounds. If new patient transition needs arise, please place a TOC consult.

## 2022-05-24 NOTE — Plan of Care (Signed)
  Problem: Activity: Goal: Risk for activity intolerance will decrease Outcome: Not Progressing   Problem: Clinical Measurements: Goal: Respiratory complications will improve Outcome: Not Progressing

## 2022-05-24 NOTE — Progress Notes (Signed)
PROGRESS NOTE  [name] DPO:[id number] DOB: 1971-06-11 DOA: 05/23/2022 PCP: Monico Blitz, MD  Brief History:  51 year old female with a history of asthma, anxiety, depression, fibromyalgia, PTSD presenting with 1 month history of shortness of breath.  She went to see her PCP a little over 1 week prior to this admission on 05/16/2022.  The patient was given a 10-day prescription for levofloxacin and a prescription for prednisone.  She stated that her breathing continue to worsen, but more significantly in the last 2 days prior to admission.  She complains of a nonproductive cough.  She denies any chest pain, nausea, vomiting, diarrhea, abdominal pain.  She states that her son is sick with some URI type symptoms.  She denies any recent travels.  She does not smoke currently. In the ED, the patient was afebrile and hemodynamically stable with oxygen saturation 100% room air.  CBC showed WBC 11.1, hemoglobin 12.6, platelets 280,000.  Sodium 137, potassium 3.4, bicarbonate 22, serum creatinine 0.83.  Troponin 2>> 2.  EKG shows sinus rhythm with no ST-T wave changes.  CTA chest was negative for PE but showed small bilateral pleural effusions and right upper lobe nodule.  VBG showed 7.6 10/09/39/25 on room air.  The patient was given an hour-long nebulizer treatment with albuterol and Solu-Medrol.  She continued to have shortness of breath and wheezing.  She was admitted for further evaluation and treatment.    Assessment and Plan: * Asthma exacerbation Continue IV Solu-Medrol Continue DuoNebs -Start Pulmicort -Certainly some of her dyspnea is being driven by her anxiety>> restart alprazolam -Check viral respiratory panel  Mixed hyperlipidemia Continue statin  Hyperglycemia Check hemoglobin  NovoLog sliding scale  Bipolar I disorder, most recent episode (or current) mixed, moderate Continue Latuda, Cymbalta, Trileptal      Family Communication:   Family at bedside updated  11/7  Consultants:  none  Code Status:  FULL   DVT Prophylaxis:  [place] Lovenox   Procedures: As Listed in Progress Note Above  Antibiotics: None     Subjective: Pt is breathing better.  Still has some dyspnea on exertion.  Denies cp, n/v/d  Objective: Vitals:   05/24/22 0533 05/24/22 0723 05/24/22 1250 05/24/22 1300  BP: 107/79     Pulse: 88     Resp: 20     Temp: 97.6 F (36.4 C)  (!) 97.5 F (36.4 C)   TempSrc: Oral  Oral   SpO2: 98% 98% 99% 100%  Weight:      Height:        Intake/Output Summary (Last 24 hours) at 05/24/2022 1705 Last data filed at 05/24/2022 1300 Gross per 24 hour  Intake 720 ml  Output --  Net 720 ml   Weight change: 2.495 kg Exam:  General:  Pt is alert, follows commands appropriately, not in acute distress HEENT: No icterus, No thrush, No neck mass, [place]/AT Cardiovascular: RRR, S1/S2, no rubs, no gallops Respiratory: bibasilar rales. No wheeze Abdomen: Soft/+BS, non tender, non distended, no guarding Extremities: No edema, No lymphangitis, No petechiae, No rashes, no synovitis   Data Reviewed: I have personally reviewed following labs and imaging studies Basic Metabolic Panel: Recent Labs  Lab 05/23/22 0610 05/24/22 0425  NA 137 137  K 3.4* 4.4  CL 108 106  CO2 22 23  GLUCOSE 155* 167*  BUN 14 18  CREATININE 0.83 0.74  CALCIUM 9.6 9.6  MG  --  2.3   Liver  Function Tests: No results for input(s): "AST", "ALT", "ALKPHOS", "BILITOT", "PROT", "ALBUMIN" in the last 168 hours. No results for input(s): "LIPASE", "AMYLASE" in the last 168 hours. No results for input(s): "AMMONIA" in the last 168 hours. Coagulation Profile: No results for input(s): "INR", "PROTIME" in the last 168 hours. CBC: Recent Labs  Lab 05/23/22 0610 05/24/22 0425  WBC 11.1* 23.2*  NEUTROABS 6.8  --   HGB 12.6 12.7  HCT 38.8 39.3  MCV 90.4 90.8  PLT 280 300   Cardiac Enzymes: No results for input(s): "CKTOTAL", "CKMB", "CKMBINDEX", "TROPONINI" in  the last 168 hours. BNP: Invalid input(s): "POCBNP" CBG: No results for input(s): "GLUCAP" in the last 168 hours. HbA1C: Recent Labs    05/23/22 1337  HGBA1C 6.3*   Urine analysis:    Component Value Date/Time   COLORURINE YELLOW 11/30/2019 1631   APPEARANCEUR CLEAR 11/30/2019 1631   LABSPEC 1.030 11/30/2019 1631   PHURINE 5.0 11/30/2019 1631   GLUCOSEU 150 (A) 11/30/2019 1631   HGBUR NEGATIVE 11/30/2019 1631   BILIRUBINUR NEGATIVE 11/30/2019 1631   KETONESUR NEGATIVE 11/30/2019 1631   PROTEINUR NEGATIVE 11/30/2019 1631   NITRITE NEGATIVE 11/30/2019 1631   LEUKOCYTESUR NEGATIVE 11/30/2019 1631   Sepsis Labs: '@LABRCNTIP'$ (procalcitonin:4,lacticidven:4) ) Recent Results (from the past 240 hour(s))  Resp Panel by RT-PCR (Flu A&B, Covid) Anterior Nasal Swab     Status: None   Collection Time: 05/23/22  6:32 AM   Specimen: Anterior Nasal Swab  Result Value Ref Range Status   SARS Coronavirus 2 by RT PCR NEGATIVE NEGATIVE Final    Comment: (NOTE) SARS-CoV-2 target nucleic acids are NOT DETECTED.  The SARS-CoV-2 RNA is generally detectable in upper respiratory specimens during the acute phase of infection. The lowest concentration of SARS-CoV-2 viral copies this assay can detect is 138 copies/mL. A negative result does not preclude SARS-Cov-2 infection and should not be used as the sole basis for treatment or other patient management decisions. A negative result may occur with  improper specimen collection/handling, submission of specimen other than nasopharyngeal swab, presence of viral mutation(s) within the areas targeted by this assay, and inadequate number of viral copies(<138 copies/mL). A negative result must be combined with clinical observations, patient history, and epidemiological information. The expected result is Negative.  Fact Sheet for Patients:  EntrepreneurPulse.com.au  Fact Sheet for Healthcare Providers:   IncredibleEmployment.be  This test is no t yet approved or cleared by the [place] FDA and  has been authorized for detection and/or diagnosis of SARS-CoV-2 by FDA under an Emergency Use Authorization (EUA). This EUA will remain  in effect (meaning this test can be used) for the duration of the COVID-19 declaration under Section 564(b)(1) of the Act, 21 U.S.C.section 360bbb-3(b)(1), unless the authorization is terminated  or revoked sooner.       Influenza A by PCR NEGATIVE NEGATIVE Final   Influenza B by PCR NEGATIVE NEGATIVE Final    Comment: (NOTE) The Xpert Xpress SARS-CoV-2/FLU/RSV plus assay is intended as an aid in the diagnosis of influenza from Nasopharyngeal swab specimens and should not be used as a sole basis for treatment. Nasal washings and aspirates are unacceptable for Xpert Xpress SARS-CoV-2/FLU/RSV testing.  Fact Sheet for Patients: EntrepreneurPulse.com.au  Fact Sheet for Healthcare Providers: IncredibleEmployment.be  This test is not yet approved or cleared by the [place] FDA and has been authorized for detection and/or diagnosis of SARS-CoV-2 by FDA under an Emergency Use Authorization (EUA). This EUA will remain in effect (meaning this test  can be used) for the duration of the COVID-19 declaration under Section 564(b)(1) of the Act, 21 U.S.C. section 360bbb-3(b)(1), unless the authorization is terminated or revoked.  Performed at [facility], [address] [place], [place] [address]   Respiratory (~20 pathogens) panel by PCR     Status: Abnormal   Collection Time: 05/24/22  2:05 AM   Specimen: Nasopharyngeal Swab; Respiratory  Result Value Ref Range Status   Adenovirus NOT DETECTED NOT DETECTED Final   Coronavirus 229E NOT DETECTED NOT DETECTED Final    Comment: (NOTE) The Coronavirus on the Respiratory Panel, DOES NOT test for the novel  Coronavirus (2019 nCoV)    Coronavirus HKU1  NOT DETECTED NOT DETECTED Final   Coronavirus NL63 NOT DETECTED NOT DETECTED Final   Coronavirus OC43 NOT DETECTED NOT DETECTED Final   Metapneumovirus NOT DETECTED NOT DETECTED Final   Rhinovirus / Enterovirus NOT DETECTED NOT DETECTED Final   Influenza A NOT DETECTED NOT DETECTED Final   Influenza B NOT DETECTED NOT DETECTED Final   Parainfluenza Virus 1 NOT DETECTED NOT DETECTED Final   Parainfluenza Virus 2 NOT DETECTED NOT DETECTED Final   Parainfluenza Virus 3 NOT DETECTED NOT DETECTED Final   Parainfluenza Virus 4 NOT DETECTED NOT DETECTED Final   Respiratory Syncytial Virus DETECTED (A) NOT DETECTED Final   Bordetella pertussis NOT DETECTED NOT DETECTED Final   Bordetella Parapertussis NOT DETECTED NOT DETECTED Final   Chlamydophila pneumoniae NOT DETECTED NOT DETECTED Final   Mycoplasma pneumoniae NOT DETECTED NOT DETECTED Final    Comment: Performed at [facility], [address], [place], [place] [address]     Scheduled Meds:  allopurinol  300 mg Oral Daily   atorvastatin  40 mg Oral Daily   budesonide (PULMICORT) nebulizer solution  0.5 mg Nebulization BID   colchicine  0.6 mg Oral Daily   DULoxetine  60 mg Oral Daily   enoxaparin (LOVENOX) injection  50 mg Subcutaneous Q68T   folic acid  1 mg Oral Daily   ipratropium-albuterol  3 mL Nebulization TID   lurasidone  40 mg Oral QHS   [START ON 05/25/2022] methotrexate  25 mg Oral Weekly   methylPREDNISolone (SOLU-MEDROL) injection  60 mg Intravenous Q12H   mirabegron ER  25 mg Oral Daily   Oxcarbazepine  600 mg Oral QHS   prazosin  5 mg Oral BID   vitamin B-12  100 mcg Oral Daily   Continuous Infusions:  Procedures/Studies: ECHOCARDIOGRAM COMPLETE  Result Date: 05/23/2022    ECHOCARDIOGRAM REPORT   Patient Name:   RENIKA SHIFLET Date of Exam: 05/23/2022 Medical Rec #:  [id number]           Height:       67.0 in Accession #:    [id number]          Weight:       243.5 lb Date of Birth:  [birth year]            BSA:          2.199 m Patient Age:    27 years            BP:           134/73 mmHg Patient Gender: F                   HR:           96 bpm. Exam Location:  [facility] Procedure: 2D Echo, Cardiac Doppler and Color  Doppler Indications:    Dyspnea R06.00  History:        Patient has no prior history of Echocardiogram examinations.                 Risk Factors:Dyslipidemia.                 Sleep apnea (From Hx).  Sonographer:    Alvino Chapel RCS Referring Phys: [phone] Jennaya Pogue IMPRESSIONS  1. Left ventricular ejection fraction, by estimation, is >75%. The left ventricle has hyperdynamic function. The left ventricle has no regional wall motion abnormalities. Left ventricular diastolic parameters were normal.  2. Right ventricular systolic function is normal. The right ventricular size is normal.  3. The mitral valve is grossly normal. No evidence of mitral valve regurgitation. No evidence of mitral stenosis.  4. The aortic valve is tricuspid. Aortic valve regurgitation is not visualized. No aortic stenosis is present.  5. The inferior vena cava is normal in size with greater than 50% respiratory variability, suggesting right atrial pressure of 3 mmHg. Comparison(s): No prior Echocardiogram. Conclusion(s)/Recommendation(s): Normal biventricular function without evidence of hemodynamically significant valvular heart disease. FINDINGS  Left Ventricle: Left ventricular ejection fraction, by estimation, is >75%. The left ventricle has hyperdynamic function. The left ventricle has no regional wall motion abnormalities. The left ventricular internal cavity size was small. There is no left  ventricular hypertrophy. Left ventricular diastolic parameters were normal. Right Ventricle: The right ventricular size is normal. No increase in right ventricular wall thickness. Right ventricular systolic function is normal. Left Atrium: Left atrial size was normal in size. Right Atrium: Right atrial size was normal in size. Pericardium:  There is no evidence of pericardial effusion. Mitral Valve: The mitral valve is grossly normal. No evidence of mitral valve regurgitation. No evidence of mitral valve stenosis. Tricuspid Valve: The tricuspid valve is grossly normal. Tricuspid valve regurgitation is not demonstrated. No evidence of tricuspid stenosis. Aortic Valve: The aortic valve is tricuspid. Aortic valve regurgitation is not visualized. No aortic stenosis is present. Pulmonic Valve: The pulmonic valve was not well visualized. Pulmonic valve regurgitation is not visualized. No evidence of pulmonic stenosis. Aorta: The aortic root is normal in size and structure. Venous: The inferior vena cava is normal in size with greater than 50% respiratory variability, suggesting right atrial pressure of 3 mmHg. IAS/Shunts: No atrial level shunt detected by color flow Doppler.  LEFT VENTRICLE PLAX 2D LVIDd:         4.10 cm   Diastology LVIDs:         2.60 cm   LV e' medial:    8.92 cm/s LV PW:         1.00 cm   LV E/e' medial:  7.3 LV IVS:        1.00 cm   LV e' lateral:   13.30 cm/s LVOT diam:     1.90 cm   LV E/e' lateral: 4.9 LV SV:         68 LV SV Index:   31 LVOT Area:     2.84 cm  RIGHT VENTRICLE RV S prime:     21.30 cm/s TAPSE (M-mode): 3.0 cm LEFT ATRIUM             Index        RIGHT ATRIUM           Index LA diam:        3.50 cm 1.59 cm/m   RA Area:  16.00 cm LA Vol (A2C):   36.7 ml 16.69 ml/m  RA Volume:   41.30 ml  18.78 ml/m LA Vol (A4C):   43.2 ml 19.65 ml/m LA Biplane Vol: 42.8 ml 19.47 ml/m  AORTIC VALVE LVOT Vmax:   129.00 cm/s LVOT Vmean:  87.800 cm/s LVOT VTI:    0.240 m  AORTA Ao Root diam: 2.80 cm MITRAL VALVE MV Area (PHT): 2.24 cm    SHUNTS MV Decel Time: 338 msec    Systemic VTI:  0.24 m MV E velocity: 65.50 cm/s  Systemic Diam: 1.90 cm MV A velocity: 66.50 cm/s MV E/A ratio:  0.98 Vishnu Priya Mallipeddi Electronically signed by Lorelee Cover Mallipeddi Signature Date/Time: 05/23/2022/4:53:25 PM    Final    CT Angio Chest  PE W and/or Wo Contrast  Result Date: 05/23/2022 CLINICAL DATA:  Pulmonary embolism (PE) suspected, high prob EXAM: CT ANGIOGRAPHY CHEST WITH CONTRAST TECHNIQUE: Multidetector CT imaging of the chest was performed using the standard protocol during bolus administration of intravenous contrast. Multiplanar CT image reconstructions and MIPs were obtained to evaluate the vascular anatomy. RADIATION DOSE REDUCTION: This exam was performed according to the departmental dose-optimization program which includes automated exposure control, adjustment of the mA and/or kV according to patient size and/or use of iterative reconstruction technique. CONTRAST:  18m OMNIPAQUE IOHEXOL 350 MG/ML SOLN COMPARISON:  Chest radiograph from the same day. FINDINGS: Cardiovascular: Satisfactory opacification of the pulmonary arteries to the segmental level. No evidence of pulmonary embolism. Normal heart size. No pericardial effusion. Mediastinum/Nodes: No enlarged mediastinal, hilar, or axillary lymph nodes. Thyroid gland, trachea, and esophagus demonstrate no significant findings. Lungs/Pleura: Small bilateral pleural effusions with mild overlying dependent atelectasis. Otherwise, clear lungs. No pneumothorax. 5 mm pulmonary nodule in the right upper lobe (series 6, image 53). Upper Abdomen: No acute abnormality. Musculoskeletal: No acute findings. Review of the MIP images confirms the above findings. IMPRESSION: 1. No evidence of acute pulmonary embolism. 2. Small bilateral pleural effusions with mild overlying atelectasis. 3. 5 mm right upper lobe pulmonary nodule. No follow-up needed if patient is low-risk.This recommendation follows the consensus statement: Guidelines for Management of Incidental Pulmonary Nodules Detected on CT Images: From the Fleischner Society 2017; Radiology 2017; 284:228-243. 4. Approximately 1.6 cm right thyroid nodule. Recommend thyroid [place](ref: J Am Coll Radiol. 2015 Feb;12(2): 143-50). Electronically  Signed   By: FMargaretha SheffieldM.D.   On: 05/23/2022 11:03   DG Chest Portable 1 View  Result Date: 05/23/2022 CLINICAL DATA:  51year old female with history of shortness of breath for the past 2 days. EXAM: PORTABLE CHEST 1 VIEW COMPARISON:  Chest x-ray 05/16/2022. FINDINGS: Lung volumes are normal. No consolidative airspace disease. No pleural effusions. No pneumothorax. No pulmonary nodule or mass noted. Pulmonary vasculature and the cardiomediastinal silhouette are within normal limits. IMPRESSION: No radiographic evidence of acute cardiopulmonary disease. Electronically Signed   By: DVinnie LangtonM.D.   On: 05/23/2022 06:29    DOrson Eva DO  [facility]  If 7PM-7AM, please contact night-coverage [email or web address] Password [email or web address] 05/24/2022, 5:05 PM   LOS: 1 day

## 2022-05-24 NOTE — Assessment & Plan Note (Signed)
A1C--6.3 Novolog sliding scale

## 2022-05-25 DIAGNOSIS — M069 Rheumatoid arthritis, unspecified: Secondary | ICD-10-CM

## 2022-05-25 DIAGNOSIS — B338 Other specified viral diseases: Secondary | ICD-10-CM

## 2022-05-25 DIAGNOSIS — F32A Depression, unspecified: Secondary | ICD-10-CM

## 2022-05-25 DIAGNOSIS — E669 Obesity, unspecified: Secondary | ICD-10-CM

## 2022-05-25 DIAGNOSIS — K219 Gastro-esophageal reflux disease without esophagitis: Secondary | ICD-10-CM

## 2022-05-25 DIAGNOSIS — J4541 Moderate persistent asthma with (acute) exacerbation: Secondary | ICD-10-CM

## 2022-05-25 LAB — GLUCOSE, CAPILLARY
Glucose-Capillary: 171 mg/dL — ABNORMAL HIGH (ref 70–99)
Glucose-Capillary: 188 mg/dL — ABNORMAL HIGH (ref 70–99)

## 2022-05-25 MED ORDER — PANTOPRAZOLE SODIUM 40 MG PO TBEC: 40.0000 mg | DELAYED_RELEASE_TABLET | Freq: Every day | ORAL | 1 refills | Status: DC

## 2022-05-25 MED ORDER — BUDESONIDE-FORMOTEROL FUMARATE 160-4.5 MCG/ACT IN AERO: 2.0000 | INHALATION_SPRAY | Freq: Two times a day (BID) | RESPIRATORY_TRACT | 4 refills | Status: DC

## 2022-05-25 MED ORDER — PREDNISONE 20 MG PO TABS: ORAL_TABLET | ORAL | 0 refills | Status: DC

## 2022-05-25 NOTE — Progress Notes (Signed)
IV removed and discharge instructions reviewed.  To follow up with primary MD within 10 days. Scripts sent to pharmacy.  Son here and to drive home.  Transported by WC to main entrance by students.

## 2022-05-25 NOTE — Plan of Care (Signed)
  Problem: Clinical Measurements: Goal: Respiratory complications will improve Outcome: Not Progressing   

## 2022-05-25 NOTE — Progress Notes (Signed)
Alert and oriented.  Took shower at shift change this morning. Lungs clear.  On room air. Wants to go home

## 2022-05-25 NOTE — Care Management Important Message (Signed)
Important Message  Patient Details  Name: [name] MRN: [id number] Date of Birth: August 26, 1970   Medicare Important Message Given:  N/A - LOS <3 / Initial given by admissions     Tommy Medal 05/25/2022, 3:46 PM

## 2022-05-25 NOTE — Discharge Summary (Signed)
Physician Discharge Summary   Patient: [name] MRN: [id number] DOB: July 01, 1971  Admit date:     05/23/2022  Discharge date: 05/25/22  Discharge Physician: Barton Dubois   PCP: Monico Blitz, MD   Recommendations at discharge:  Repeat basic metabolic panel to follow electrolytes renal function Assist patient with pulmonology follow-up for PFTs and further adjustment to maintenance therapy of her asthma. Continue to follow CBG/A1c to determine the need of initiating treatment for diabetes (currently with A1c of 6.3 meeting criteria for prediabetes). Continue assisting patient with weight loss management.  Discharge Diagnoses: Principal Problem:   Asthma exacerbation Active Problems:   Hyperglycemia   Mixed hyperlipidemia   Impaired glucose tolerance   Respiratory syncytial virus (RSV) infection   Gastroesophageal reflux disease   Depression   Rheumatoid arthritis (HCC) Class II obesity  Hospital Course: 51 year old female with a history of asthma, anxiety, depression, fibromyalgia, PTSD presenting with 1 month history of shortness of breath.  She went to see her PCP a little over 1 week prior to this admission on 05/16/2022.  The patient was given a 10-day prescription for levofloxacin and a prescription for prednisone.  She stated that her breathing continue to worsen, but more significantly in the last 2 days prior to admission.  She complains of a nonproductive cough.  She denies any chest pain, nausea, vomiting, diarrhea, abdominal pain.  She states that her son is sick with some URI type symptoms.  She denies any recent travels.  She does not smoke currently. In the ED, the patient was afebrile and hemodynamically stable with oxygen saturation 100% room air.  CBC showed WBC 11.1, hemoglobin 12.6, platelets 280,000.  Sodium 137, potassium 3.4, bicarbonate 22, serum creatinine 0.83.  Troponin 2>> 2.  EKG shows sinus rhythm with no ST-T wave changes.  CTA chest was negative  for PE but showed small bilateral pleural effusions and right upper lobe nodule.  VBG showed 7.6 10/09/39/25 on room air.  The patient was given an hour-long nebulizer treatment with albuterol and Solu-Medrol.  She continued to have shortness of breath and wheezing.  She was admitted for further evaluation and treatment.  Assessment and Plan: * Asthma exacerbation -Discharge on prednisone tapering; resumption of as needed rescue inhalers and initiation of Symbicort twice a day. -Continue home anxiolytic therapy. -Viral respiratory panel +RSV -Overall condition significantly improved at time of discharge.  -Not requiring oxygen supplementation and able to speak in full sentences. -Outpatient follow-up with pulmonology recommended.  Prediabetes/impaired glucose tolerance -A1C--6.3 -Sliding scale insulin provided while inpatient -Patient advised for lifestyle modification and close follow-up with PCP to further determine hypoglycemic treatment  Mixed hyperlipidemia -Continue statin -Heart healthy diet discussed with patient.  Hyperglycemia -In the setting of his steroids usage and underlying prediabetes -Lifestyle modifications discussed with patient -Continue close outpatient follow-up.  Bipolar I disorder, most recent episode (or current) mixed, moderate -Continue Latuda, Cymbalta, Trileptal  Class 2 obesity -Body mass index is 38.14 kg/m. -Low calorie diet, portion control and increase physical activity discussed with patient.  Rheumatoid arthritis/fibromyalgia -Continue home analgesics regimen and the use of methotrexate.  GERD -Continue PPI.  Consultants: None Procedures performed: See below for x-ray reports. Disposition: Home Diet recommendation: Heart healthy/modified carbohydrate diet.  DISCHARGE MEDICATION: Allergies as of 05/25/2022       Reactions   Peanut-containing Drug Products Anaphylaxis   Swelling   Tegretol [carbamazepine] Shortness Of Breath, Swelling    Lithium Other (See Comments)   Bad acne  Sweet Potato Itching   Penicillins Rash        Medication List     STOP taking these medications    levofloxacin 500 MG tablet Commonly known as: LEVAQUIN   pantoprazole sodium 40 mg Commonly known as: PROTONIX Replaced by: pantoprazole 40 MG tablet       TAKE these medications    albuterol 108 (90 Base) MCG/ACT inhaler Commonly known as: VENTOLIN HFA Inhale 1-2 puffs into the lungs every 6 (six) hours as needed for wheezing or shortness of breath.   allopurinol 300 MG tablet Commonly known as: ZYLOPRIM Take 300 mg by mouth daily.   ALPRAZolam 0.5 MG tablet Commonly known as: XANAX Take 0.5 mg by mouth 2 (two) times daily as needed for anxiety.   atorvastatin 40 MG tablet Commonly known as: LIPITOR Take 40 mg by mouth daily.   budesonide-formoterol 160-4.5 MCG/ACT inhaler Commonly known as: Symbicort Inhale 2 puffs into the lungs in the morning and at bedtime.   colchicine 0.6 MG tablet Take 0.6 mg by mouth daily.   DULoxetine 60 MG capsule Commonly known as: CYMBALTA Take 60 mg by mouth daily.   folic acid 1 MG tablet Commonly known as: FOLVITE Take 1 mg by mouth daily.   furosemide 20 MG tablet Commonly known as: LASIX Take 20 mg by mouth.   hydrOXYzine 50 MG tablet Commonly known as: ATARAX Take 25 mg by mouth 2 (two) times daily as needed for itching.   Latuda 40 MG Tabs tablet Generic drug: lurasidone Take 40 mg by mouth at bedtime.   levocetirizine 5 MG tablet Commonly known as: XYZAL Take 5 mg by mouth daily.   metFORMIN 500 MG tablet Commonly known as: GLUCOPHAGE Take 500 mg by mouth daily with breakfast.   methotrexate 2.5 MG tablet Commonly known as: RHEUMATREX Take 25 mg by mouth once a week. Caution:Chemotherapy. Protect from light.  Takes 10 per day once a week.   mirabegron ER 25 MG Tb24 tablet Commonly known as: MYRBETRIQ Take 25 mg by mouth daily.   Oxcarbazepine 300 MG  tablet Commonly known as: TRILEPTAL Take 1 tablet (300 mg total) by mouth 2 (two) times daily. What changed:  how much to take when to take this   pantoprazole 40 MG tablet Commonly known as: Protonix Take 1 tablet (40 mg total) by mouth daily. Replaces: pantoprazole sodium 40 mg   pantoprazole 40 MG tablet Commonly known as: Protonix Take 1 tablet (40 mg total) by mouth daily.   prazosin 5 MG capsule Commonly known as: MINIPRESS Take 5 mg by mouth 2 (two) times daily.   predniSONE 20 MG tablet Commonly known as: DELTASONE Take 3 tablets by mouth daily x1 day; then 2 tablets by mouth daily times a day; then 1 tablet by mouth daily x3 days; then half tablet by mouth daily x3 days and stop prednisone. What changed:  medication strength how much to take how to take this when to take this additional instructions   pregabalin 75 MG capsule Commonly known as: LYRICA Take 1 capsule by mouth daily.   vitamin B-12 100 MCG tablet Commonly known as: CYANOCOBALAMIN Take 100 mcg by mouth daily.        Follow-up Information     Monico Blitz, MD. Schedule an appointment as soon as possible for a visit in 10 day(s).   Specialty: Internal Medicine Contact information: [address] [place] [address] [phone]  Discharge Exam: Filed Weights   05/23/22 0609 05/23/22 1421  Weight: 108 kg 110.5 kg   General exam: Alert, awake, oriented x 3; speaking in full sentences and not requiring oxygen supplementation.  Feeling ready to go home. Respiratory system: Very little expiratory wheezing appreciated on exam; no using accessory muscles.  Good air movement bilaterally and not requiring oxygen supplementation. Cardiovascular system:RRR. No murmurs, rubs, gallops.  No JVD. Gastrointestinal system: Abdomen is obese, nondistended, soft and nontender. No organomegaly or masses felt. Normal bowel sounds heard. Central nervous system: Alert and oriented. No focal  neurological deficits. Extremities: No cyanosis or clubbing. Skin: No petechiae. Psychiatry: Judgement and insight appear normal. Mood & affect appropriate.    Condition at discharge: Stable and improved.  The results of significant diagnostics from this hospitalization (including imaging, microbiology, ancillary and laboratory) are listed below for reference.   Imaging Studies: ECHOCARDIOGRAM COMPLETE  Result Date: 05/23/2022    ECHOCARDIOGRAM REPORT   Patient Name:   ASHANA TULLO Date of Exam: 05/23/2022 Medical Rec #:  [id number]           Height:       67.0 in Accession #:    [id number]          Weight:       243.5 lb Date of Birth:  [birth year]           BSA:          2.199 m Patient Age:    24 years            BP:           134/73 mmHg Patient Gender: F                   HR:           96 bpm. Exam Location:  [facility] Procedure: 2D Echo, Cardiac Doppler and Color Doppler Indications:    Dyspnea R06.00  History:        Patient has no prior history of Echocardiogram examinations.                 Risk Factors:Dyslipidemia.                 Sleep apnea (From Hx).  Sonographer:    Alvino Chapel RCS Referring Phys: [phone] DAVID TAT IMPRESSIONS  1. Left ventricular ejection fraction, by estimation, is >75%. The left ventricle has hyperdynamic function. The left ventricle has no regional wall motion abnormalities. Left ventricular diastolic parameters were normal.  2. Right ventricular systolic function is normal. The right ventricular size is normal.  3. The mitral valve is grossly normal. No evidence of mitral valve regurgitation. No evidence of mitral stenosis.  4. The aortic valve is tricuspid. Aortic valve regurgitation is not visualized. No aortic stenosis is present.  5. The inferior vena cava is normal in size with greater than 50% respiratory variability, suggesting right atrial pressure of 3 mmHg. Comparison(s): No prior Echocardiogram. Conclusion(s)/Recommendation(s): Normal biventricular  function without evidence of hemodynamically significant valvular heart disease. FINDINGS  Left Ventricle: Left ventricular ejection fraction, by estimation, is >75%. The left ventricle has hyperdynamic function. The left ventricle has no regional wall motion abnormalities. The left ventricular internal cavity size was small. There is no left  ventricular hypertrophy. Left ventricular diastolic parameters were normal. Right Ventricle: The right ventricular size is normal. No increase in right ventricular wall thickness. Right ventricular systolic function is normal. Left Atrium: Left atrial size  was normal in size. Right Atrium: Right atrial size was normal in size. Pericardium: There is no evidence of pericardial effusion. Mitral Valve: The mitral valve is grossly normal. No evidence of mitral valve regurgitation. No evidence of mitral valve stenosis. Tricuspid Valve: The tricuspid valve is grossly normal. Tricuspid valve regurgitation is not demonstrated. No evidence of tricuspid stenosis. Aortic Valve: The aortic valve is tricuspid. Aortic valve regurgitation is not visualized. No aortic stenosis is present. Pulmonic Valve: The pulmonic valve was not well visualized. Pulmonic valve regurgitation is not visualized. No evidence of pulmonic stenosis. Aorta: The aortic root is normal in size and structure. Venous: The inferior vena cava is normal in size with greater than 50% respiratory variability, suggesting right atrial pressure of 3 mmHg. IAS/Shunts: No atrial level shunt detected by color flow Doppler.  LEFT VENTRICLE PLAX 2D LVIDd:         4.10 cm   Diastology LVIDs:         2.60 cm   LV e' medial:    8.92 cm/s LV PW:         1.00 cm   LV E/e' medial:  7.3 LV IVS:        1.00 cm   LV e' lateral:   13.30 cm/s LVOT diam:     1.90 cm   LV E/e' lateral: 4.9 LV SV:         68 LV SV Index:   31 LVOT Area:     2.84 cm  RIGHT VENTRICLE RV S prime:     21.30 cm/s TAPSE (M-mode): 3.0 cm LEFT ATRIUM             Index         RIGHT ATRIUM           Index LA diam:        3.50 cm 1.59 cm/m   RA Area:     16.00 cm LA Vol (A2C):   36.7 ml 16.69 ml/m  RA Volume:   41.30 ml  18.78 ml/m LA Vol (A4C):   43.2 ml 19.65 ml/m LA Biplane Vol: 42.8 ml 19.47 ml/m  AORTIC VALVE LVOT Vmax:   129.00 cm/s LVOT Vmean:  87.800 cm/s LVOT VTI:    0.240 m  AORTA Ao Root diam: 2.80 cm MITRAL VALVE MV Area (PHT): 2.24 cm    SHUNTS MV Decel Time: 338 msec    Systemic VTI:  0.24 m MV E velocity: 65.50 cm/s  Systemic Diam: 1.90 cm MV A velocity: 66.50 cm/s MV E/A ratio:  0.98 Vishnu Priya Mallipeddi Electronically signed by Lorelee Cover Mallipeddi Signature Date/Time: 05/23/2022/4:53:25 PM    Final    CT Angio Chest PE W and/or Wo Contrast  Result Date: 05/23/2022 CLINICAL DATA:  Pulmonary embolism (PE) suspected, high prob EXAM: CT ANGIOGRAPHY CHEST WITH CONTRAST TECHNIQUE: Multidetector CT imaging of the chest was performed using the standard protocol during bolus administration of intravenous contrast. Multiplanar CT image reconstructions and MIPs were obtained to evaluate the vascular anatomy. RADIATION DOSE REDUCTION: This exam was performed according to the departmental dose-optimization program which includes automated exposure control, adjustment of the mA and/or kV according to patient size and/or use of iterative reconstruction technique. CONTRAST:  43m OMNIPAQUE IOHEXOL 350 MG/ML SOLN COMPARISON:  Chest radiograph from the same day. FINDINGS: Cardiovascular: Satisfactory opacification of the pulmonary arteries to the segmental level. No evidence of pulmonary embolism. Normal heart size. No pericardial effusion. Mediastinum/Nodes: No enlarged mediastinal, hilar, or axillary lymph  nodes. Thyroid gland, trachea, and esophagus demonstrate no significant findings. Lungs/Pleura: Small bilateral pleural effusions with mild overlying dependent atelectasis. Otherwise, clear lungs. No pneumothorax. 5 mm pulmonary nodule in the right upper lobe  (series 6, image 53). Upper Abdomen: No acute abnormality. Musculoskeletal: No acute findings. Review of the MIP images confirms the above findings. IMPRESSION: 1. No evidence of acute pulmonary embolism. 2. Small bilateral pleural effusions with mild overlying atelectasis. 3. 5 mm right upper lobe pulmonary nodule. No follow-up needed if patient is low-risk.This recommendation follows the consensus statement: Guidelines for Management of Incidental Pulmonary Nodules Detected on CT Images: From the Fleischner Society 2017; Radiology 2017; 284:228-243. 4. Approximately 1.6 cm right thyroid nodule. Recommend thyroid US (ref: J Am Coll Radiol. 2015 Feb;12(2): 143-50). Electronically Signed   By: Margaretha Sheffield M.D.   On: 05/23/2022 11:03   DG Chest Portable 1 View  Result Date: 05/23/2022 CLINICAL DATA:  51 year old female with history of shortness of breath for the past 2 days. EXAM: PORTABLE CHEST 1 VIEW COMPARISON:  Chest x-ray 05/16/2022. FINDINGS: Lung volumes are normal. No consolidative airspace disease. No pleural effusions. No pneumothorax. No pulmonary nodule or mass noted. Pulmonary vasculature and the cardiomediastinal silhouette are within normal limits. IMPRESSION: No radiographic evidence of acute cardiopulmonary disease. Electronically Signed   By: Vinnie Langton M.D.   On: 05/23/2022 06:29    Microbiology: Results for orders placed or performed during the hospital encounter of 05/23/22  Resp Panel by RT-PCR (Flu A&B, Covid) Anterior Nasal Swab     Status: None   Collection Time: 05/23/22  6:32 AM   Specimen: Anterior Nasal Swab  Result Value Ref Range Status   SARS Coronavirus 2 by RT PCR NEGATIVE NEGATIVE Final    Comment: (NOTE) SARS-CoV-2 target nucleic acids are NOT DETECTED.  The SARS-CoV-2 RNA is generally detectable in upper respiratory specimens during the acute phase of infection. The lowest concentration of SARS-CoV-2 viral copies this assay can detect is 138  copies/mL. A negative result does not preclude SARS-Cov-2 infection and should not be used as the sole basis for treatment or other patient management decisions. A negative result may occur with  improper specimen collection/handling, submission of specimen other than nasopharyngeal swab, presence of viral mutation(s) within the areas targeted by this assay, and inadequate number of viral copies(<138 copies/mL). A negative result must be combined with clinical observations, patient history, and epidemiological information. The expected result is Negative.  Fact Sheet for Patients:  EntrepreneurPulse.com.au  Fact Sheet for Healthcare Providers:  IncredibleEmployment.be  This test is no t yet approved or cleared by the [place] FDA and  has been authorized for detection and/or diagnosis of SARS-CoV-2 by FDA under an Emergency Use Authorization (EUA). This EUA will remain  in effect (meaning this test can be used) for the duration of the COVID-19 declaration under Section 564(b)(1) of the Act, 21 U.S.C.section 360bbb-3(b)(1), unless the authorization is terminated  or revoked sooner.       Influenza A by PCR NEGATIVE NEGATIVE Final   Influenza B by PCR NEGATIVE NEGATIVE Final    Comment: (NOTE) The Xpert Xpress SARS-CoV-2/FLU/RSV plus assay is intended as an aid in the diagnosis of influenza from Nasopharyngeal swab specimens and should not be used as a sole basis for treatment. Nasal washings and aspirates are unacceptable for Xpert Xpress SARS-CoV-2/FLU/RSV testing.  Fact Sheet for Patients: EntrepreneurPulse.com.au  Fact Sheet for Healthcare Providers: IncredibleEmployment.be  This test is not yet approved or cleared by the  [place] Architectural technologist and has been authorized for detection and/or diagnosis of SARS-CoV-2 by FDA under an Print production planner (EUA). This EUA will remain in effect (meaning  this test can be used) for the duration of the COVID-19 declaration under Section 564(b)(1) of the Act, 21 U.S.C. section 360bbb-3(b)(1), unless the authorization is terminated or revoked.  Performed at [facility], [address] [place], [place] [address]   Respiratory (~20 pathogens) panel by PCR     Status: Abnormal   Collection Time: 05/24/22  2:05 AM   Specimen: Nasopharyngeal Swab; Respiratory  Result Value Ref Range Status   Adenovirus NOT DETECTED NOT DETECTED Final   Coronavirus 229E NOT DETECTED NOT DETECTED Final    Comment: (NOTE) The Coronavirus on the Respiratory Panel, DOES NOT test for the novel  Coronavirus (2019 nCoV)    Coronavirus HKU1 NOT DETECTED NOT DETECTED Final   Coronavirus NL63 NOT DETECTED NOT DETECTED Final   Coronavirus OC43 NOT DETECTED NOT DETECTED Final   Metapneumovirus NOT DETECTED NOT DETECTED Final   Rhinovirus / Enterovirus NOT DETECTED NOT DETECTED Final   Influenza A NOT DETECTED NOT DETECTED Final   Influenza B NOT DETECTED NOT DETECTED Final   Parainfluenza Virus 1 NOT DETECTED NOT DETECTED Final   Parainfluenza Virus 2 NOT DETECTED NOT DETECTED Final   Parainfluenza Virus 3 NOT DETECTED NOT DETECTED Final   Parainfluenza Virus 4 NOT DETECTED NOT DETECTED Final   Respiratory Syncytial Virus DETECTED (A) NOT DETECTED Final   Bordetella pertussis NOT DETECTED NOT DETECTED Final   Bordetella Parapertussis NOT DETECTED NOT DETECTED Final   Chlamydophila pneumoniae NOT DETECTED NOT DETECTED Final   Mycoplasma pneumoniae NOT DETECTED NOT DETECTED Final    Comment: Performed at [facility], [address], [place], [place] [address]    Labs: CBC: Recent Labs  Lab 05/23/22 0610 05/24/22 0425  WBC 11.1* 23.2*  NEUTROABS 6.8  --   HGB 12.6 12.7  HCT 38.8 39.3  MCV 90.4 90.8  PLT 280 366   Basic Metabolic Panel: Recent Labs  Lab 05/23/22 0610 05/24/22 0425  NA 137 137  K 3.4* 4.4  CL 108 106  CO2 22 23  GLUCOSE 155*  167*  BUN 14 18  CREATININE 0.83 0.74  CALCIUM 9.6 9.6  MG  --  2.3   Liver Function Tests: No results for input(s): "AST", "ALT", "ALKPHOS", "BILITOT", "PROT", "ALBUMIN" in the last 168 hours. CBG: Recent Labs  Lab 05/24/22 1744 05/24/22 2129 05/25/22 0738 05/25/22 1230  GLUCAP 191* 219* 171* 188*    Discharge time spent: greater than 30 minutes.  Signed: Barton Dubois, MD [facility] 05/25/2022

## 2022-05-27 DIAGNOSIS — Z299 Encounter for prophylactic measures, unspecified: Secondary | ICD-10-CM | POA: Diagnosis not present

## 2022-05-27 DIAGNOSIS — J45901 Unspecified asthma with (acute) exacerbation: Secondary | ICD-10-CM | POA: Diagnosis not present

## 2022-05-27 DIAGNOSIS — J45909 Unspecified asthma, uncomplicated: Secondary | ICD-10-CM | POA: Diagnosis not present

## 2022-05-27 DIAGNOSIS — E1165 Type 2 diabetes mellitus with hyperglycemia: Secondary | ICD-10-CM | POA: Diagnosis not present

## 2022-06-10 DIAGNOSIS — J45901 Unspecified asthma with (acute) exacerbation: Secondary | ICD-10-CM | POA: Diagnosis not present

## 2022-06-10 DIAGNOSIS — R5383 Other fatigue: Secondary | ICD-10-CM | POA: Diagnosis not present

## 2022-06-10 DIAGNOSIS — Z299 Encounter for prophylactic measures, unspecified: Secondary | ICD-10-CM | POA: Diagnosis not present

## 2022-06-13 DIAGNOSIS — Z794 Long term (current) use of insulin: Secondary | ICD-10-CM | POA: Diagnosis not present

## 2022-06-13 DIAGNOSIS — E119 Type 2 diabetes mellitus without complications: Secondary | ICD-10-CM | POA: Diagnosis not present

## 2022-06-24 DIAGNOSIS — J45901 Unspecified asthma with (acute) exacerbation: Secondary | ICD-10-CM | POA: Diagnosis not present

## 2022-06-24 DIAGNOSIS — R0602 Shortness of breath: Secondary | ICD-10-CM | POA: Diagnosis not present

## 2022-06-24 DIAGNOSIS — R609 Edema, unspecified: Secondary | ICD-10-CM | POA: Diagnosis not present

## 2022-06-24 DIAGNOSIS — R5383 Other fatigue: Secondary | ICD-10-CM | POA: Diagnosis not present

## 2022-06-28 ENCOUNTER — Ambulatory Visit (INDEPENDENT_AMBULATORY_CARE_PROVIDER_SITE_OTHER): Payer: Medicare Other | Admitting: Pulmonary Disease

## 2022-06-28 ENCOUNTER — Encounter: Payer: Self-pay | Admitting: Pulmonary Disease

## 2022-06-28 VITALS — BP 122/84 | HR 98 | Ht 67.0 in | Wt 250.0 lb

## 2022-06-28 DIAGNOSIS — R600 Localized edema: Secondary | ICD-10-CM

## 2022-06-28 DIAGNOSIS — R0601 Orthopnea: Secondary | ICD-10-CM | POA: Diagnosis not present

## 2022-06-28 DIAGNOSIS — R0683 Snoring: Secondary | ICD-10-CM

## 2022-06-28 DIAGNOSIS — J4540 Moderate persistent asthma, uncomplicated: Secondary | ICD-10-CM

## 2022-06-28 NOTE — Progress Notes (Signed)
Synopsis: Referred in December 2023 for hospital follow up  Subjective:   PATIENT ID: [name] GENDER: female DOB: 1971-06-16, MRN: [id number]   HPI  Chief Complaint  Patient presents with   Hospitalization Follow-up    HFU for asthma exacerbation. States she is still having issues with DOE.    [name] is a 51 year old woman, never smoker with sleep apnea, rheumatoid arthritis on methotrexate and asthma who is referred to pulmonary clinic for hospital follow up.   She was admitted 11/6 to 11/8 for asthma exacerbation due to RSV infection. She was volunteering at [employer] when she picked up the infection. She continues to have shortness of breath when leaning forward since April of this year. She did have wheezing and cough prior to her RSV infection.  She does report some seasonal allergies but reports that the therapy to her bothers her breathing most.  She has been using Symbicort 2 puffs twice daily 160/4.5 mcg with some relief.  Continues to have a chest discomfort/pressure since being in the hospital.  She does not feel this is related to anxiety and Xanax does not help this symptoms.  She reports urinating 3 times per night, she has orthopnea and paroxysmal nocturnal dyspnea.  She does report snoring in her sleep.  She has gained significant amount of weight since 2002 and starting medication for her mood.  Past Medical History:  Diagnosis Date   Anemia    Anxiety    Arthritis    orthopedic   Bipolar disorder (HCC)    Depression    Fibromyalgia    GERD (gastroesophageal reflux disease)    Hypotension    IBS (irritable bowel syndrome)    Migraine    PTSD (post-traumatic stress disorder)    on prazosin   Sleep apnea    Tonsillar hypertrophy    Uterine fibroid      Family History  Problem Relation Age of Onset   Depression Mother    Hypertension Mother    Hyperlipidemia Mother    Other Mother        bone degeneration   Depression  Brother    Alcohol abuse Brother    Alcohol abuse Brother    Hypertension Father    Hyperlipidemia Father    Heart attack Father    Diabetes Father    Bipolar disorder Son    ADD / ADHD Son    Bipolar disorder Son    ADD / ADHD Son    Asperger's syndrome Son    Bipolar disorder Daughter      Social History   Socioeconomic History   Marital status: Divorced    Spouse name: Not on file   Number of children: Not on file   Years of education: Not on file   Highest education level: Not on file  Occupational History   Not on file  Tobacco Use   Smoking status: Never   Smokeless tobacco: Never  Vaping Use   Vaping Use: Never used  Substance and Sexual Activity   Alcohol use: No   Drug use: No   Sexual activity: Not Currently    Birth control/protection: Surgical    Comment: tubal and ablation  Other Topics Concern   Not on file  Social History Narrative   Not on file   Social Determinants of Health   Financial Resource Strain: Not on file  Food Insecurity: Not on file  Transportation Needs: Not on file  Physical Activity: Not  on file  Stress: Not on file  Social Connections: Not on file  Intimate Partner Violence: Not on file     Allergies  Allergen Reactions   Peanut-Containing Drug Products Anaphylaxis    Swelling    Tegretol [Carbamazepine] Shortness Of Breath and Swelling   Lithium Other (See Comments)    Bad acne    Sweet Potato Itching   Penicillins Rash     Outpatient Medications Prior to Visit  Medication Sig Dispense Refill   albuterol (VENTOLIN HFA) 108 (90 Base) MCG/ACT inhaler Inhale 1-2 puffs into the lungs every 6 (six) hours as needed for wheezing or shortness of breath.     allopurinol (ZYLOPRIM) 300 MG tablet Take 300 mg by mouth daily.     ALPRAZolam (XANAX) 0.5 MG tablet Take 0.5 mg by mouth 2 (two) times daily as needed for anxiety.     atorvastatin (LIPITOR) 40 MG tablet Take 40 mg by mouth daily.     budesonide-formoterol  (SYMBICORT) 160-4.5 MCG/ACT inhaler Inhale 2 puffs into the lungs in the morning and at bedtime. 1 each 4   colchicine 0.6 MG tablet Take 0.6 mg by mouth daily.     DULoxetine (CYMBALTA) 60 MG capsule Take 60 mg by mouth daily.     folic acid (FOLVITE) 1 MG tablet Take 1 mg by mouth daily.     furosemide (LASIX) 20 MG tablet Take 20 mg by mouth.     hydrOXYzine (ATARAX) 50 MG tablet Take 25 mg by mouth 2 (two) times daily as needed for itching.     ipratropium-albuterol (DUONEB) 0.5-2.5 (3) MG/3ML SOLN Take 3 mLs by nebulization every 6 (six) hours as needed.     LATUDA 40 MG TABS tablet Take 40 mg by mouth at bedtime.     levocetirizine (XYZAL) 5 MG tablet Take 5 mg by mouth daily.     metFORMIN (GLUCOPHAGE) 500 MG tablet Take 500 mg by mouth daily with breakfast.     methotrexate 2.5 MG tablet Take 25 mg by mouth once a week. Caution:Chemotherapy. Protect from light.  Takes 10 per day once a week.     mirabegron ER (MYRBETRIQ) 25 MG TB24 tablet Take 25 mg by mouth daily.     Oxcarbazepine (TRILEPTAL) 300 MG tablet Take 1 tablet (300 mg total) by mouth 2 (two) times daily. (Patient taking differently: Take 600 mg by mouth at bedtime.) 60 tablet 2   pantoprazole (PROTONIX) 40 MG tablet Take 1 tablet (40 mg total) by mouth daily. 30 tablet 1   pantoprazole (PROTONIX) 40 MG tablet Take 1 tablet (40 mg total) by mouth daily. 30 tablet 1   prazosin (MINIPRESS) 5 MG capsule Take 5 mg by mouth 2 (two) times daily.     pregabalin (LYRICA) 75 MG capsule Take 1 capsule by mouth daily.     vitamin B-12 (CYANOCOBALAMIN) 100 MCG tablet Take 100 mcg by mouth daily.     predniSONE (DELTASONE) 20 MG tablet Take 3 tablets by mouth daily x1 day; then 2 tablets by mouth daily times a day; then 1 tablet by mouth daily x3 days; then half tablet by mouth daily x3 days and stop prednisone. 15 tablet 0   No facility-administered medications prior to visit.    Review of Systems  Constitutional:  Negative for  chills, fever, malaise/fatigue and weight loss.  HENT:  Negative for congestion, sinus pain and sore throat.   Eyes: Negative.   Respiratory:  Positive for cough, shortness of breath and  wheezing. Negative for hemoptysis and sputum production.   Cardiovascular:  Positive for orthopnea, leg swelling and PND. Negative for chest pain, palpitations and claudication.  Gastrointestinal:  Negative for abdominal pain, heartburn, nausea and vomiting.  Genitourinary: Negative.   Musculoskeletal:  Negative for joint pain and myalgias.  Skin:  Negative for rash.  Neurological:  Negative for weakness.  Endo/Heme/Allergies: Negative.   Psychiatric/Behavioral: Negative.     Objective:   Vitals:   06/28/22 1525  BP: 122/84  Pulse: 98  SpO2: 100%  Weight: 250 lb (113.4 kg)  Height: 5\' 7"  (1.702 m)   Physical Exam Constitutional:      General: She is not in acute distress.    Appearance: She is not ill-appearing.  HENT:     Head: Normocephalic and atraumatic.  Eyes:     General: No scleral icterus.    Conjunctiva/sclera: Conjunctivae normal.     Pupils: Pupils are equal, round, and reactive to light.  Cardiovascular:     Rate and Rhythm: Normal rate and regular rhythm.     Pulses: Normal pulses.     Heart sounds: Normal heart sounds. No murmur heard. Pulmonary:     Effort: Pulmonary effort is normal.     Breath sounds: Normal breath sounds. No wheezing, rhonchi or rales.  Abdominal:     General: Bowel sounds are normal.     Palpations: Abdomen is soft.  Musculoskeletal:     Right lower leg: Edema present.     Left lower leg: Edema present.  Lymphadenopathy:     Cervical: No cervical adenopathy.  Skin:    General: Skin is warm and dry.  Neurological:     General: No focal deficit present.     Mental Status: She is alert.  Psychiatric:        Mood and Affect: Mood normal.        Behavior: Behavior normal.        Thought Content: Thought content normal.        Judgment: Judgment  normal.       CBC    Component Value Date/Time   WBC 23.2 (H) 05/24/2022 0425   RBC 4.33 05/24/2022 0425   HGB 12.7 05/24/2022 0425   HCT 39.3 05/24/2022 0425   PLT 300 05/24/2022 0425   MCV 90.8 05/24/2022 0425   MCH 29.3 05/24/2022 0425   MCHC 32.3 05/24/2022 0425   RDW 16.4 (H) 05/24/2022 0425   LYMPHSABS 3.3 05/23/2022 0610   MONOABS 0.4 05/23/2022 0610   EOSABS 0.0 05/23/2022 0610   BASOSABS 0.1 05/23/2022 0610      Latest Ref Rng & Units 05/24/2022    4:25 AM 05/23/2022    6:10 AM 07/22/2021   12:30 AM  BMP  Glucose 70 - 99 mg/dL 962  952  841   BUN 6 - 20 mg/dL 18  14  14    Creatinine 0.44 - 1.00 mg/dL 3.24  4.01  0.27   Sodium 135 - 145 mmol/L 137  137  132   Potassium 3.5 - 5.1 mmol/L 4.4  3.4  3.5   Chloride 98 - 111 mmol/L 106  108  102   CO2 22 - 32 mmol/L 23  22  22    Calcium 8.9 - 10.3 mg/dL 9.6  9.6  9.1    Chest imaging: CTA Chest 05/23/22 Cardiovascular: Satisfactory opacification of the pulmonary arteries to the segmental level. No evidence of pulmonary embolism. Normal heart size. No pericardial effusion.   Mediastinum/Nodes: No  enlarged mediastinal, hilar, or axillary lymph nodes. Thyroid gland, trachea, and esophagus demonstrate no significant findings.   Lungs/Pleura: Small bilateral pleural effusions with mild overlying dependent atelectasis. Otherwise, clear lungs. No pneumothorax. 5 mm pulmonary nodule in the right upper lobe (series 6, image 53).  PFT:     No data to display          Labs:  Path:  Echo:  Heart Catheterization:       Assessment & Plan:   Moderate persistent asthma without complication - Plan: Pulmonary Function Test, Home sleep test  Snoring - Plan: Home sleep test  Orthopnea - Plan: Ambulatory referral to Cardiology  Lower extremity edema - Plan: Ambulatory referral to Cardiology  Discussion: [name] is a 51 year old woman, never smoker with sleep apnea, rheumatoid arthritis on  methotrexate and asthma who is referred to pulmonary clinic for hospital follow up.  Recent CTA chest scan did not show involvement of pulmonary emboli did not show significant parenchymal lung involvement concerning for interstitial lung disease related to her history of rheumatoid arthritis.  Based on history she does have orthopnea, paroxysmal nocturnal dyspnea and lower extremity edema concerning for heart failure.  Recent echo during hospitalization showed hyperdynamic LV with EF greater than 75%.  We will refer her to cardiology for further evaluation.  We will check home sleep study.  She is to try Breztri 2 puffs twice daily and monitor for any improvement in her cough or wheezing symptoms and shortness of breath.  We will send in prescription if she has noticed improvement.  Follow-up in 2 months with pulmonary function testing.  Melody Comas, MD [facility] Office: [phone]   Current Outpatient Medications:    albuterol (VENTOLIN HFA) 108 (90 Base) MCG/ACT inhaler, Inhale 1-2 puffs into the lungs every 6 (six) hours as needed for wheezing or shortness of breath., Disp: , Rfl:    allopurinol (ZYLOPRIM) 300 MG tablet, Take 300 mg by mouth daily., Disp: , Rfl:    ALPRAZolam (XANAX) 0.5 MG tablet, Take 0.5 mg by mouth 2 (two) times daily as needed for anxiety., Disp: , Rfl:    atorvastatin (LIPITOR) 40 MG tablet, Take 40 mg by mouth daily., Disp: , Rfl:    budesonide-formoterol (SYMBICORT) 160-4.5 MCG/ACT inhaler, Inhale 2 puffs into the lungs in the morning and at bedtime., Disp: 1 each, Rfl: 4   colchicine 0.6 MG tablet, Take 0.6 mg by mouth daily., Disp: , Rfl:    DULoxetine (CYMBALTA) 60 MG capsule, Take 60 mg by mouth daily., Disp: , Rfl:    folic acid (FOLVITE) 1 MG tablet, Take 1 mg by mouth daily., Disp: , Rfl:    furosemide (LASIX) 20 MG tablet, Take 20 mg by mouth., Disp: , Rfl:    hydrOXYzine (ATARAX) 50 MG tablet, Take 25 mg by mouth 2 (two)  times daily as needed for itching., Disp: , Rfl:    ipratropium-albuterol (DUONEB) 0.5-2.5 (3) MG/3ML SOLN, Take 3 mLs by nebulization every 6 (six) hours as needed., Disp: , Rfl:    LATUDA 40 MG TABS tablet, Take 40 mg by mouth at bedtime., Disp: , Rfl:    levocetirizine (XYZAL) 5 MG tablet, Take 5 mg by mouth daily., Disp: , Rfl:    metFORMIN (GLUCOPHAGE) 500 MG tablet, Take 500 mg by mouth daily with breakfast., Disp: , Rfl:    methotrexate 2.5 MG tablet, Take 25 mg by mouth once a week. Caution:Chemotherapy. Protect from light.  Takes 10  per day once a week., Disp: , Rfl:    mirabegron ER (MYRBETRIQ) 25 MG TB24 tablet, Take 25 mg by mouth daily., Disp: , Rfl:    Oxcarbazepine (TRILEPTAL) 300 MG tablet, Take 1 tablet (300 mg total) by mouth 2 (two) times daily. (Patient taking differently: Take 600 mg by mouth at bedtime.), Disp: 60 tablet, Rfl: 2   pantoprazole (PROTONIX) 40 MG tablet, Take 1 tablet (40 mg total) by mouth daily., Disp: 30 tablet, Rfl: 1   pantoprazole (PROTONIX) 40 MG tablet, Take 1 tablet (40 mg total) by mouth daily., Disp: 30 tablet, Rfl: 1   prazosin (MINIPRESS) 5 MG capsule, Take 5 mg by mouth 2 (two) times daily., Disp: , Rfl:    pregabalin (LYRICA) 75 MG capsule, Take 1 capsule by mouth daily., Disp: , Rfl:    vitamin B-12 (CYANOCOBALAMIN) 100 MCG tablet, Take 100 mcg by mouth daily., Disp: , Rfl:

## 2022-06-28 NOTE — Patient Instructions (Addendum)
Try breztri inhaler 2 puffs twice daily - rinse mouth out after each use - if you notice improvement in your breathing please let us know and we will send in a prescription  Stop symbicort while on breztri inhaler  Use albuterol inhaler as needed 1-2 puffs every 4-6 hours as needed.   We will schedule you for a home sleep study  We will refer you to cardiology for further evaluation of your shortness of breath and swelling  We will schedule you for pulmonary function tests and follow up in 2 months

## 2022-06-29 ENCOUNTER — Telehealth: Payer: Self-pay | Admitting: Pulmonary Disease

## 2022-06-29 NOTE — Telephone Encounter (Signed)
Patient states she just missed a call and would like a call back.  CB# [phone]

## 2022-06-29 NOTE — Telephone Encounter (Signed)
Patient stated she missed a call from the office.  Is not sure why she was called.  Please return call at [phone]

## 2022-06-29 NOTE — Telephone Encounter (Signed)
Called patient and updated her that on our end I am not seeing anyone trying to call her. I have looked under every tab and did not see any messages. Patient just saw Dr Erin Fulling yesterday. Nothing noted of issues with patient. Nothing further needed

## 2022-06-30 ENCOUNTER — Encounter: Payer: Self-pay | Admitting: Pulmonary Disease

## 2022-07-12 ENCOUNTER — Ambulatory Visit: Payer: Medicare Other | Admitting: Cardiovascular Disease

## 2022-07-15 ENCOUNTER — Telehealth: Payer: Self-pay | Admitting: Pulmonary Disease

## 2022-07-15 ENCOUNTER — Other Ambulatory Visit: Payer: Self-pay

## 2022-07-15 MED ORDER — FUROSEMIDE 20 MG PO TABS: 20.0000 mg | ORAL_TABLET | Freq: Every day | ORAL | 1 refills | Status: AC

## 2022-07-15 NOTE — Telephone Encounter (Signed)
Take an extra lasix 20 mg daily for a few days and eat an extra banana daily while she's on a higher dose of lasix. If no better call back for office visit.

## 2022-07-15 NOTE — Telephone Encounter (Signed)
Spoke with pt who states that her bilateral lower extremities began swelling yesterday and have increased in size today despite propping legs up. Pt stated slight increase in SOB over last 2 days as well. Pt denies any other resp symptoms. Dr. Verlee Monte please advise as Dr. Erin Fulling is unavailable today.

## 2022-07-15 NOTE — Telephone Encounter (Signed)
Called and spoke with patient, advised of Dr. Glenetta Hew recc. Refill of lasix has been sent to pts pharmacy. She verbalized understanding. Nothing further needed.

## 2022-07-20 ENCOUNTER — Ambulatory Visit: Payer: Medicare Other

## 2022-07-20 DIAGNOSIS — M546 Pain in thoracic spine: Secondary | ICD-10-CM | POA: Diagnosis not present

## 2022-07-20 DIAGNOSIS — R0683 Snoring: Secondary | ICD-10-CM

## 2022-07-20 DIAGNOSIS — J4540 Moderate persistent asthma, uncomplicated: Secondary | ICD-10-CM

## 2022-07-20 DIAGNOSIS — M9903 Segmental and somatic dysfunction of lumbar region: Secondary | ICD-10-CM | POA: Diagnosis not present

## 2022-07-20 DIAGNOSIS — M9902 Segmental and somatic dysfunction of thoracic region: Secondary | ICD-10-CM | POA: Diagnosis not present

## 2022-07-20 DIAGNOSIS — M62830 Muscle spasm of back: Secondary | ICD-10-CM | POA: Diagnosis not present

## 2022-07-20 DIAGNOSIS — M9901 Segmental and somatic dysfunction of cervical region: Secondary | ICD-10-CM | POA: Diagnosis not present

## 2022-07-20 DIAGNOSIS — G4733 Obstructive sleep apnea (adult) (pediatric): Secondary | ICD-10-CM | POA: Diagnosis not present

## 2022-07-20 DIAGNOSIS — M542 Cervicalgia: Secondary | ICD-10-CM | POA: Diagnosis not present

## 2022-07-21 ENCOUNTER — Telehealth: Payer: Self-pay | Admitting: Pulmonary Disease

## 2022-07-21 DIAGNOSIS — J019 Acute sinusitis, unspecified: Secondary | ICD-10-CM

## 2022-07-21 NOTE — Telephone Encounter (Signed)
ATC patient but unable to reach her. Unable to leave  voicemail due to mailbox being full. Will try again

## 2022-07-22 MED ORDER — AZITHROMYCIN 250 MG PO TABS: ORAL_TABLET | ORAL | 0 refills | Status: DC

## 2022-07-22 NOTE — Telephone Encounter (Signed)
Zpak sent in for patient

## 2022-07-22 NOTE — Telephone Encounter (Signed)
ATC X1 unable to LVM patients mailbox was full will try again later.

## 2022-07-22 NOTE — Telephone Encounter (Signed)
PT states she would like something for her illness (Hoarse and coughing) AND she would like a RX for the sample inhaler Dr. Erin Fulling gave her during her appt. He said if she liked it he'd send in an RX for her.  [facility] in [place]  She missed return call due to taking her Rozanna Boer baby to the hospital.   Her # is [phone]

## 2022-07-22 NOTE — Telephone Encounter (Signed)
Spoke with pt who states she has been hoarse and had a increase in cough over last 3 days. Pt does states that here SOB and wheezing have not gone away but are any worse either. Pt is requesting Judithann Sauger be sent into [facility] which was done. Dr. Erin Fulling please advise on the acute increase for respiratory symptoms.

## 2022-07-26 NOTE — Telephone Encounter (Signed)
Called pt and there was no answer  Called [facility]  The pharmacist advised that the rx for zpack was delivered to her  Closing encounter per protocol

## 2022-07-27 ENCOUNTER — Ambulatory Visit: Payer: 59 | Admitting: Cardiovascular Disease

## 2022-07-29 DIAGNOSIS — G4733 Obstructive sleep apnea (adult) (pediatric): Secondary | ICD-10-CM | POA: Diagnosis not present

## 2022-08-10 ENCOUNTER — Telehealth: Payer: Self-pay | Admitting: Pulmonary Disease

## 2022-08-10 ENCOUNTER — Ambulatory Visit: Payer: 59 | Admitting: Cardiovascular Disease

## 2022-08-10 NOTE — Telephone Encounter (Signed)
  PT sounds bad. SOB and labored breathing when speaking. Wants Breztri Inhaler refill but I think Nurse should call please as a safety measure.   PT refused appt when offered. Below are Dr's AVS notes from last visit (a hospital FU.). I re-read this to her to clarify which inhaler needed. :    Stop symbicort while on breztri inhaler   Use albuterol inhaler as needed 1-2 puffs every 4-6 hours as needed.    We will schedule you for a home sleep study   We will refer you to cardiology for further evaluation of your shortness of breath and swelling   We will schedule you for pulmonary function tests and follow up in 2 months   Call her moms; Phone [phone] Kalman Shan) Her phone is down

## 2022-08-11 MED ORDER — BREZTRI AEROSPHERE 160-9-4.8 MCG/ACT IN AERO: 2.0000 | INHALATION_SPRAY | Freq: Two times a day (BID) | RESPIRATORY_TRACT | 3 refills | Status: DC

## 2022-08-11 NOTE — Telephone Encounter (Signed)
Rx for [name] has been sent to the pharmacy for pt.  Attempted to call pt on mother's phone as requested due to pt's phone being down but unable to reach. Left message on there for pt's mother to have pt call the office back. Want to make sure she is able to pick up the [facility] inhaler and also want to know how pt is doing compared to how she sounded on the phone when she originally called the office 1/24.

## 2022-08-14 NOTE — Progress Notes (Unsigned)
Cardiology Office Note:   Date:  08/15/2022  NAME:  TELLY BROBERG    MRN: [id number] DOB:  1970/08/04   PCP:  Monico Blitz, MD  Cardiologist:  None  Electrophysiologist:  None   Referring MD: Freddi Starr, MD   Chief Complaint  Patient presents with   Leg Swelling   History of Present Illness:   EARLYN SYLVAN is a 52 y.o. female with a hx of asthma, RA, obesity who is being seen today for the evaluation of LE edema at the request of Monico Blitz, MD. she reports she has had intermittent lower extremity edema for the past 2 to 3 months.  Symptoms come and go.  She also reports shortness of breath.  She has asthma which is being treated by her pulmonologist.  She has no evidence of edema today.  She reports Lasix improves this.  She describes she can go several days without edema.  She reports it comes and last several days.  She had an echocardiogram when she was admitted to the hospital with RSV pneumonia.  This showed normal LV and RV function.  She had normal diastolic parameters.  Her blood pressure is well-controlled.  She is diabetic but her A1c is 6.6.  She has never had a heart attack or stroke.  She does report depression and anxiety.  We discussed that her echocardiogram was normal.  Recent chest imaging shows no pulmonary edema.  She really has no evidence of congestive heart failure.  She is obese.  I suspect she could have some venous insufficiency.  Her EKG demonstrates sinus rhythm with no acute ischemic changes.  She reports chest tightness with breathing but this is asthma related.  She tells me she has been on and off prednisone for the past 2 years.  I suspect this could be contributing to her weight gain as well as intermittent edema.  She is also on lots of anxiety and depression medications.  Her CV exam is very normal today.  She does not smoke.  No alcohol or drug use is reported.  Normal echo with normal diastolic parameters   Problem  List Asthma RA Depression/Anxiety  Obesity -BMI 38 5. DM -A1c 6.6  Past Medical History: Past Medical History:  Diagnosis Date   Anemia    Anxiety    Arthritis    orthopedic   Bipolar disorder (HCC)    Depression    Fibromyalgia    GERD (gastroesophageal reflux disease)    Hypotension    IBS (irritable bowel syndrome)    Migraine    PTSD (post-traumatic stress disorder)    on prazosin   Sleep apnea    Tonsillar hypertrophy    Uterine fibroid     Past Surgical History: Past Surgical History:  Procedure Laterality Date   ENDOMETRIAL ABLATION     TONSILLECTOMY AND ADENOIDECTOMY N/A 02/13/2018   Procedure: TONSILLECTOMY AND ADENOIDECTOMY;  Surgeon: Leta Baptist, MD;  Location: [facility];  Service: ENT;  Laterality: N/A;   TUBAL LIGATION      Current Medications: Current Meds  Medication Sig   albuterol (VENTOLIN HFA) 108 (90 Base) MCG/ACT inhaler Inhale 1-2 puffs into the lungs every 6 (six) hours as needed for wheezing or shortness of breath.   allopurinol (ZYLOPRIM) 300 MG tablet Take 300 mg by mouth daily.   ALPRAZolam (XANAX) 0.5 MG tablet Take 0.5 mg by mouth 2 (two) times daily as needed for anxiety.   atorvastatin (LIPITOR) 40 MG tablet  Take 40 mg by mouth daily.   Budeson-Glycopyrrol-Formoterol (BREZTRI AEROSPHERE) 160-9-4.8 MCG/ACT AERO Inhale 2 puffs into the lungs in the morning and at bedtime.   colchicine 0.6 MG tablet Take 0.6 mg by mouth daily.   DULoxetine (CYMBALTA) 60 MG capsule Take 60 mg by mouth daily.   folic acid (FOLVITE) 1 MG tablet Take 1 mg by mouth daily.   furosemide (LASIX) 20 MG tablet Take 1 tablet (20 mg total) by mouth daily.   hydrOXYzine (ATARAX) 50 MG tablet Take 25 mg by mouth 2 (two) times daily as needed for itching.   ipratropium-albuterol (DUONEB) 0.5-2.5 (3) MG/3ML SOLN Take 3 mLs by nebulization every 6 (six) hours as needed.   LATUDA 40 MG TABS tablet Take 40 mg by mouth at bedtime.   levocetirizine (XYZAL) 5 MG  tablet Take 5 mg by mouth daily.   metFORMIN (GLUCOPHAGE) 500 MG tablet Take 500 mg by mouth daily with breakfast.   methotrexate 2.5 MG tablet Take 25 mg by mouth once a week. Caution:Chemotherapy. Protect from light.  Takes 10 per day once a week.   mirabegron ER (MYRBETRIQ) 25 MG TB24 tablet Take 25 mg by mouth daily.   Oxcarbazepine (TRILEPTAL) 300 MG tablet Take 1 tablet (300 mg total) by mouth 2 (two) times daily. (Patient taking differently: Take 600 mg by mouth at bedtime.)   pantoprazole (PROTONIX) 40 MG tablet Take 1 tablet (40 mg total) by mouth daily.   pantoprazole (PROTONIX) 40 MG tablet Take 1 tablet (40 mg total) by mouth daily.   prazosin (MINIPRESS) 5 MG capsule Take 5 mg by mouth 2 (two) times daily.   pregabalin (LYRICA) 75 MG capsule Take 1 capsule by mouth daily.   vitamin B-12 (CYANOCOBALAMIN) 100 MCG tablet Take 100 mcg by mouth daily.     Allergies:    Peanut-containing drug products, Tegretol [carbamazepine], Lithium, Sweet potato, and Penicillins   Social History: Social History   Socioeconomic History   Marital status: Divorced    Spouse name: Not on file   Number of children: Not on file   Years of education: Not on file   Highest education level: Not on file  Occupational History   Not on file  Tobacco Use   Smoking status: Never   Smokeless tobacco: Never  Vaping Use   Vaping Use: Never used  Substance and Sexual Activity   Alcohol use: No   Drug use: No   Sexual activity: Not Currently    Birth control/protection: Surgical    Comment: tubal and ablation  Other Topics Concern   Not on file  Social History Narrative   Not on file   Social Determinants of Health   Financial Resource Strain: Not on file  Food Insecurity: Not on file  Transportation Needs: Not on file  Physical Activity: Not on file  Stress: Not on file  Social Connections: Not on file     Family History: The patient's family history includes ADD / ADHD in her son and  son; Alcohol abuse in her brother and brother; Asperger's syndrome in her son; Bipolar disorder in her daughter, son, and son; Depression in her brother and mother; Diabetes in her father; Heart attack in her father; Hyperlipidemia in her father and mother; Hypertension in her father and mother; Other in her mother.  ROS:   All other ROS reviewed and negative. Pertinent positives noted in the HPI.     EKGs/Labs/Other Studies Reviewed:   The following studies were personally reviewed  by me today:  EKG:  EKG is ordered today.  The ekg ordered today demonstrates normal sinus rhythm rate 80, nonspecific ST changes, and was personally reviewed by me.   TTE 05/23/2022  1. Left ventricular ejection fraction, by estimation, is >75%. The left  ventricle has hyperdynamic function. The left ventricle has no regional  wall motion abnormalities. Left ventricular diastolic parameters were  normal.   2. Right ventricular systolic function is normal. The right ventricular  size is normal.   3. The mitral valve is grossly normal. No evidence of mitral valve  regurgitation. No evidence of mitral stenosis.   4. The aortic valve is tricuspid. Aortic valve regurgitation is not  visualized. No aortic stenosis is present.   5. The inferior vena cava is normal in size with greater than 50%  respiratory variability, suggesting right atrial pressure of 3 mmHg.   Recent Labs: 05/23/2022: B Natriuretic Peptide 46.0; TSH 1.668 05/24/2022: BUN 18; Creatinine, Ser 0.74; Hemoglobin 12.7; Magnesium 2.3; Platelets 300; Potassium 4.4; Sodium 137   Recent Lipid Panel No results found for: "CHOL", "TRIG", "HDL", "CHOLHDL", "VLDL", "LDLCALC", "LDLDIRECT"  Physical Exam:   VS:  BP 112/76 (BP Location: Left Arm, Patient Position: Sitting, Cuff Size: Large)   Pulse 80   Ht '5\' 8"'$  (1.727 m)   Wt 245 lb 12.8 oz (111.5 kg)   SpO2 99%   BMI 37.37 kg/m    Wt Readings from Last 3 Encounters:  08/15/22 245 lb 12.8 oz (111.5 kg)   06/28/22 250 lb (113.4 kg)  05/23/22 243 lb 8 oz (110.5 kg)    General: Well nourished, well developed, in no acute distress Head: Atraumatic, normal size  Eyes: PEERLA, EOMI  Neck: Supple, no JVD Endocrine: No thryomegaly Cardiac: Normal S1, S2; RRR; no murmurs, rubs, or gallops Lungs: Clear to auscultation bilaterally, no wheezing, rhonchi or rales  Abd: Soft, nontender, no hepatomegaly  Ext: No edema, pulses 2+ Musculoskeletal: No deformities, BUE and BLE strength normal and equal Skin: Warm and dry, no rashes   Neuro: Alert and oriented to person, place, time, and situation, CNII-XII grossly intact, no focal deficits  Psych: Normal mood and affect   ASSESSMENT:   KAYCEE HAYCRAFT is a 52 y.o. female who presents for the following: 1. Leg edema   2. SOB (shortness of breath)     PLAN:   1. Leg edema 2. SOB (shortness of breath) -Normal exam today.  Recent echo shows normal LV and RV function with normal pulmonary pressures and normal diastolic parameters.  She has no clinical evidence of congestive heart failure.  Her lower extremity edema could be related to venous insufficiency from obesity.  I recommended regular exercise as well as to continue with Lasix as needed.  I can see no evidence of congestive heart failure.  Her swelling also may be related to intermittent prednisone use.  She reports weight gain over the past 2 years.  I suspect being on and off prednisone could contribute.  She reports shortness of breath which is likely asthma related.  Again would just recommend a conservative approach with exercise.  She will see [place] back as needed.  We discussed that she does not have congestive heart failure.  Disposition: Return if symptoms worsen or fail to improve.  Medication Adjustments/Labs and Tests Ordered: Current medicines are reviewed at length with the patient today.  Concerns regarding medicines are outlined above.  Orders Placed This Encounter  Procedures    EKG 12-Lead  No orders of the defined types were placed in this encounter.   Patient Instructions  Medication Instructions:  The current medical regimen is effective;  continue present plan and medications.  *If you need a refill on your cardiac medications before your next appointment, please call your pharmacy*   Follow-Up: At [facility], you and your health needs are our priority.  As part of our continuing mission to provide you with exceptional heart care, we have created designated Provider Care Teams.  These Care Teams include your primary Cardiologist (physician) and Advanced Practice Providers (APPs -  Physician Assistants and Nurse Practitioners) who all work together to provide you with the care you need, when you need it.  We recommend signing up for the patient portal called "MyChart".  Sign up information is provided on this After Visit Summary.  MyChart is used to connect with patients for Virtual Visits (Telemedicine).  Patients are able to view lab/test results, encounter notes, upcoming appointments, etc.  Non-urgent messages can be sent to your provider as well.   To learn more about what you can do with MyChart, go to [email or web address].    Your next appointment:   As needed  Provider:   Eleonore Chiquito, MD     Signed, Addison Naegeli. Audie Box, MD, [place]  [address], [place], [place] [address] [phone]  08/15/2022 3:01 PM

## 2022-08-15 ENCOUNTER — Encounter: Payer: Self-pay | Admitting: Cardiovascular Disease

## 2022-08-15 ENCOUNTER — Ambulatory Visit: Payer: 59 | Attending: Cardiovascular Disease | Admitting: Cardiovascular Disease

## 2022-08-15 VITALS — BP 112/76 | HR 80 | Ht 68.0 in | Wt 245.8 lb

## 2022-08-15 DIAGNOSIS — R600 Localized edema: Secondary | ICD-10-CM | POA: Diagnosis not present

## 2022-08-15 DIAGNOSIS — R0602 Shortness of breath: Secondary | ICD-10-CM

## 2022-08-15 NOTE — Patient Instructions (Signed)
Medication Instructions:  The current medical regimen is effective;  continue present plan and medications.  *If you need a refill on your cardiac medications before your next appointment, please call your pharmacy*   Follow-Up: At [facility], you and your health needs are our priority.  As part of our continuing mission to provide you with exceptional heart care, we have created designated Provider Care Teams.  These Care Teams include your primary Cardiologist (physician) and Advanced Practice Providers (APPs -  Physician Assistants and Nurse Practitioners) who all work together to provide you with the care you need, when you need it.  We recommend signing up for the patient portal called "MyChart".  Sign up information is provided on this After Visit Summary.  MyChart is used to connect with patients for Virtual Visits (Telemedicine).  Patients are able to view lab/test results, encounter notes, upcoming appointments, etc.  Non-urgent messages can be sent to your provider as well.   To learn more about what you can do with MyChart, go to [email or web address].    Your next appointment:   As needed  Provider:   [place] O'Neal, MD   

## 2022-08-18 DIAGNOSIS — E1142 Type 2 diabetes mellitus with diabetic polyneuropathy: Secondary | ICD-10-CM | POA: Diagnosis not present

## 2022-08-18 DIAGNOSIS — M069 Rheumatoid arthritis, unspecified: Secondary | ICD-10-CM | POA: Diagnosis not present

## 2022-08-18 DIAGNOSIS — D849 Immunodeficiency, unspecified: Secondary | ICD-10-CM | POA: Diagnosis not present

## 2022-08-18 DIAGNOSIS — Z299 Encounter for prophylactic measures, unspecified: Secondary | ICD-10-CM | POA: Diagnosis not present

## 2022-09-09 DIAGNOSIS — M79671 Pain in right foot: Secondary | ICD-10-CM | POA: Diagnosis not present

## 2022-09-09 DIAGNOSIS — Z299 Encounter for prophylactic measures, unspecified: Secondary | ICD-10-CM | POA: Diagnosis not present

## 2022-09-09 DIAGNOSIS — M79672 Pain in left foot: Secondary | ICD-10-CM | POA: Diagnosis not present

## 2022-09-09 DIAGNOSIS — M069 Rheumatoid arthritis, unspecified: Secondary | ICD-10-CM | POA: Diagnosis not present

## 2022-09-16 DIAGNOSIS — M79671 Pain in right foot: Secondary | ICD-10-CM | POA: Diagnosis not present

## 2022-09-16 DIAGNOSIS — M722 Plantar fascial fibromatosis: Secondary | ICD-10-CM | POA: Diagnosis not present

## 2022-09-16 DIAGNOSIS — Z299 Encounter for prophylactic measures, unspecified: Secondary | ICD-10-CM | POA: Diagnosis not present

## 2022-09-16 DIAGNOSIS — M797 Fibromyalgia: Secondary | ICD-10-CM | POA: Diagnosis not present

## 2022-09-16 DIAGNOSIS — M79672 Pain in left foot: Secondary | ICD-10-CM | POA: Diagnosis not present

## 2022-09-17 ENCOUNTER — Telehealth: Payer: Self-pay | Admitting: Pulmonary Disease

## 2022-09-17 NOTE — Telephone Encounter (Signed)
Patient needs follow up visit with PFT and to review her home sleep study results.  Thanks, JD

## 2022-09-19 DIAGNOSIS — M542 Cervicalgia: Secondary | ICD-10-CM | POA: Diagnosis not present

## 2022-09-19 DIAGNOSIS — M62830 Muscle spasm of back: Secondary | ICD-10-CM | POA: Diagnosis not present

## 2022-09-19 DIAGNOSIS — M9902 Segmental and somatic dysfunction of thoracic region: Secondary | ICD-10-CM | POA: Diagnosis not present

## 2022-09-19 DIAGNOSIS — M546 Pain in thoracic spine: Secondary | ICD-10-CM | POA: Diagnosis not present

## 2022-09-19 DIAGNOSIS — M9903 Segmental and somatic dysfunction of lumbar region: Secondary | ICD-10-CM | POA: Diagnosis not present

## 2022-09-19 DIAGNOSIS — M9901 Segmental and somatic dysfunction of cervical region: Secondary | ICD-10-CM | POA: Diagnosis not present

## 2022-09-19 NOTE — Telephone Encounter (Signed)
ATC X1 mailbox is full. Will attempt to call patient back later. If patient call back please schedule PFT and OV with Dr. Erin Fulling

## 2022-09-21 NOTE — Telephone Encounter (Signed)
Called and spoke w/ pt she is scheduled for a PFT @ [facility] on 3/13 @ 11am & a video visit 3/14 @ 2pm w/ [facility]

## 2022-09-28 ENCOUNTER — Encounter (HOSPITAL_BASED_OUTPATIENT_CLINIC_OR_DEPARTMENT_OTHER): Payer: 59

## 2022-09-28 NOTE — Telephone Encounter (Signed)
Sending back to Triage for 2nd attempt to contact.

## 2022-09-28 NOTE — Telephone Encounter (Signed)
Spoke with pt who states she was able to get [employer] and it is working well for her. Pt wanted to confirm future appointment for both PFT and My Chart visits. Appointments were confirmed. Nothing further needed at this time.

## 2022-09-29 ENCOUNTER — Telehealth: Payer: 59 | Admitting: Nurse Practitioner

## 2022-09-29 DIAGNOSIS — M722 Plantar fascial fibromatosis: Secondary | ICD-10-CM | POA: Diagnosis not present

## 2022-09-29 DIAGNOSIS — D849 Immunodeficiency, unspecified: Secondary | ICD-10-CM | POA: Diagnosis not present

## 2022-09-29 DIAGNOSIS — Z299 Encounter for prophylactic measures, unspecified: Secondary | ICD-10-CM | POA: Diagnosis not present

## 2022-09-29 DIAGNOSIS — M1380 Other specified arthritis, unspecified site: Secondary | ICD-10-CM | POA: Diagnosis not present

## 2022-09-29 DIAGNOSIS — M25512 Pain in left shoulder: Secondary | ICD-10-CM | POA: Diagnosis not present

## 2022-10-03 DIAGNOSIS — Z79899 Other long term (current) drug therapy: Secondary | ICD-10-CM | POA: Diagnosis not present

## 2022-10-03 DIAGNOSIS — R5383 Other fatigue: Secondary | ICD-10-CM | POA: Diagnosis not present

## 2022-10-03 DIAGNOSIS — M1380 Other specified arthritis, unspecified site: Secondary | ICD-10-CM | POA: Diagnosis not present

## 2022-10-06 ENCOUNTER — Ambulatory Visit (INDEPENDENT_AMBULATORY_CARE_PROVIDER_SITE_OTHER): Payer: 59 | Admitting: Pulmonary Disease

## 2022-10-06 DIAGNOSIS — J4540 Moderate persistent asthma, uncomplicated: Secondary | ICD-10-CM | POA: Diagnosis not present

## 2022-10-06 LAB — PULMONARY FUNCTION TEST
DL/VA % pred: 116 %
DL/VA: 4.89 ml/min/mmHg/L
DLCO cor % pred: 72 %
DLCO cor: 17.4 ml/min/mmHg
DLCO unc % pred: 72 %
DLCO unc: 17.4 ml/min/mmHg
FEF 25-75 Post: 3.21 L/sec
FEF 25-75 Pre: 2.54 L/sec
FEF2575-%Change-Post: 26 %
FEF2575-%Pred-Post: 107 %
FEF2575-%Pred-Pre: 84 %
FEV1-%Change-Post: 5 %
FEV1-%Pred-Post: 72 %
FEV1-%Pred-Pre: 68 %
FEV1-Post: 2.33 L
FEV1-Pre: 2.2 L
FEV1FVC-%Change-Post: 5 %
FEV1FVC-%Pred-Pre: 104 %
FEV6-%Change-Post: 0 %
FEV6-%Pred-Post: 67 %
FEV6-%Pred-Pre: 66 %
FEV6-Post: 2.64 L
FEV6-Pre: 2.63 L
FEV6FVC-%Pred-Post: 102 %
FEV6FVC-%Pred-Pre: 102 %
FVC-%Change-Post: 0 %
FVC-%Pred-Post: 65 %
FVC-%Pred-Pre: 65 %
FVC-Post: 2.64 L
FVC-Pre: 2.63 L
Post FEV1/FVC ratio: 88 %
Post FEV6/FVC ratio: 100 %
Pre FEV1/FVC ratio: 84 %
Pre FEV6/FVC Ratio: 100 %
RV % pred: 76 %
RV: 1.53 L
TLC % pred: 74 %
TLC: 4.18 L

## 2022-10-06 NOTE — Patient Instructions (Signed)
Full PFT Performed Today  

## 2022-10-06 NOTE — Progress Notes (Signed)
Full PFT Performed Today  

## 2022-10-10 ENCOUNTER — Telehealth (INDEPENDENT_AMBULATORY_CARE_PROVIDER_SITE_OTHER): Payer: 59 | Admitting: Nurse Practitioner

## 2022-10-10 DIAGNOSIS — J302 Other seasonal allergic rhinitis: Secondary | ICD-10-CM | POA: Diagnosis not present

## 2022-10-10 DIAGNOSIS — G4733 Obstructive sleep apnea (adult) (pediatric): Secondary | ICD-10-CM

## 2022-10-10 DIAGNOSIS — J4541 Moderate persistent asthma with (acute) exacerbation: Secondary | ICD-10-CM

## 2022-10-10 DIAGNOSIS — R0683 Snoring: Secondary | ICD-10-CM

## 2022-10-10 MED ORDER — FLUTICASONE PROPIONATE 50 MCG/ACT NA SUSP: 2.0000 | Freq: Every day | NASAL | 2 refills | Status: DC

## 2022-10-10 MED ORDER — LEVOCETIRIZINE DIHYDROCHLORIDE 5 MG PO TABS: 5.0000 mg | ORAL_TABLET | Freq: Every day | ORAL | 5 refills | Status: DC

## 2022-10-10 MED ORDER — PREDNISONE 20 MG PO TABS: 40.0000 mg | ORAL_TABLET | Freq: Every day | ORAL | 0 refills | Status: AC

## 2022-10-10 NOTE — Progress Notes (Deleted)
@Patient  ID: [name], female    DOB: Nov 19, 1970, 52 y.o.   MRN: [id number]  No chief complaint on file.   Referring provider: Monico Blitz, MD  HPI:   TEST/EVENTS:   3/21 - started around this time Cough, postnasal drip, yellow phlegm    Allergies  Allergen Reactions   Peanut-Containing Drug Products Anaphylaxis    Swelling    Tegretol [Carbamazepine] Shortness Of Breath and Swelling   Lithium Other (See Comments)    Bad acne    Sweet Potato Itching   Penicillins Rash     There is no immunization history on file for this patient.  Past Medical History:  Diagnosis Date   Anemia    Anxiety    Arthritis    orthopedic   Bipolar disorder (HCC)    Depression    Fibromyalgia    GERD (gastroesophageal reflux disease)    Hypotension    IBS (irritable bowel syndrome)    Migraine    PTSD (post-traumatic stress disorder)    on prazosin   Sleep apnea    Tonsillar hypertrophy    Uterine fibroid     Tobacco History: Social History   Tobacco Use  Smoking Status Never  Smokeless Tobacco Never   Counseling given: Not Answered   Outpatient Medications Prior to Visit  Medication Sig Dispense Refill   albuterol (VENTOLIN HFA) 108 (90 Base) MCG/ACT inhaler Inhale 1-2 puffs into the lungs every 6 (six) hours as needed for wheezing or shortness of breath.     allopurinol (ZYLOPRIM) 300 MG tablet Take 300 mg by mouth daily.     ALPRAZolam (XANAX) 0.5 MG tablet Take 0.5 mg by mouth 2 (two) times daily as needed for anxiety.     atorvastatin (LIPITOR) 40 MG tablet Take 40 mg by mouth daily.     Budeson-Glycopyrrol-Formoterol (BREZTRI AEROSPHERE) 160-9-4.8 MCG/ACT AERO Inhale 2 puffs into the lungs in the morning and at bedtime. 10.7 g 3   colchicine 0.6 MG tablet Take 0.6 mg by mouth daily.     DULoxetine (CYMBALTA) 60 MG capsule Take 60 mg by mouth daily.     folic acid (FOLVITE) 1 MG tablet Take 1 mg by mouth daily.     furosemide (LASIX) 20 MG tablet Take  1 tablet (20 mg total) by mouth daily. 30 tablet 1   hydrOXYzine (ATARAX) 50 MG tablet Take 25 mg by mouth 2 (two) times daily as needed for itching.     ipratropium-albuterol (DUONEB) 0.5-2.5 (3) MG/3ML SOLN Take 3 mLs by nebulization every 6 (six) hours as needed.     LATUDA 40 MG TABS tablet Take 40 mg by mouth at bedtime.     levocetirizine (XYZAL) 5 MG tablet Take 5 mg by mouth daily.     metFORMIN (GLUCOPHAGE) 500 MG tablet Take 500 mg by mouth daily with breakfast.     methotrexate 2.5 MG tablet Take 25 mg by mouth once a week. Caution:Chemotherapy. Protect from light.  Takes 10 per day once a week.     mirabegron ER (MYRBETRIQ) 25 MG TB24 tablet Take 25 mg by mouth daily.     Oxcarbazepine (TRILEPTAL) 300 MG tablet Take 1 tablet (300 mg total) by mouth 2 (two) times daily. (Patient taking differently: Take 600 mg by mouth at bedtime.) 60 tablet 2   pantoprazole (PROTONIX) 40 MG tablet Take 1 tablet (40 mg total) by mouth daily. 30 tablet 1   prazosin (MINIPRESS) 5 MG capsule Take 5 mg by  mouth 2 (two) times daily.     pregabalin (LYRICA) 75 MG capsule Take 1 capsule by mouth daily.     vitamin B-12 (CYANOCOBALAMIN) 100 MCG tablet Take 100 mcg by mouth daily.     azithromycin (ZITHROMAX) 250 MG tablet Take as directed (Patient not taking: Reported on 08/15/2022) 6 tablet 0   budesonide-formoterol (SYMBICORT) 160-4.5 MCG/ACT inhaler Inhale 2 puffs into the lungs in the morning and at bedtime. (Patient not taking: Reported on 08/15/2022) 1 each 4   pantoprazole (PROTONIX) 40 MG tablet Take 1 tablet (40 mg total) by mouth daily. 30 tablet 1   No facility-administered medications prior to visit.     Review of Systems:   Constitutional: No weight loss or gain, night sweats, fevers, chills, fatigue, or lassitude. HEENT: No headaches, difficulty swallowing, tooth/dental problems, or sore throat. No sneezing, itching, ear ache, nasal congestion, or post nasal drip CV:  No chest pain,  orthopnea, PND, swelling in lower extremities, anasarca, dizziness, palpitations, syncope Resp: No shortness of breath with exertion or at rest. No excess mucus or change in color of mucus. No productive or non-productive. No hemoptysis. No wheezing.  No chest wall deformity GI:  No heartburn, indigestion, abdominal pain, nausea, vomiting, diarrhea, change in bowel habits, loss of appetite, bloody stools.  GU: No dysuria, change in color of urine, urgency or frequency.  No flank pain, no hematuria  Skin: No rash, lesions, ulcerations MSK:  No joint pain or swelling.  No decreased range of motion.  No back pain. Neuro: No dizziness or lightheadedness.  Psych: No depression or anxiety. Mood stable.     Physical Exam:  There were no vitals taken for this visit.  GEN: Pleasant, interactive, well-nourished/chronically-ill appearing/acutely-ill appearing/poorly-nourished/morbidly obese; in no acute distress.****** HEENT:  Normocephalic and atraumatic. EACs patent bilaterally. TM pearly gray with present light reflex bilaterally. PERRLA. Sclera white. Nasal turbinates pink, moist and patent bilaterally. No rhinorrhea present. Oropharynx pink and moist, without exudate or edema. No lesions, ulcerations, or postnasal drip.  NECK:  Supple w/ fair ROM. No JVD present. Normal carotid impulses w/o bruits. Thyroid symmetrical with no goiter or nodules palpated. No lymphadenopathy.   CV: RRR, no m/r/g, no peripheral edema. Pulses intact, +2 bilaterally. No cyanosis, pallor or clubbing. PULMONARY:  Unlabored, regular breathing. Clear bilaterally A&P w/o wheezes/rales/rhonchi. No accessory muscle use.  GI: BS present and normoactive. Soft, non-tender to palpation. No organomegaly or masses detected. No CVA tenderness. MSK: No erythema, warmth or tenderness. Cap refil <2 sec all extrem. No deformities or joint swelling noted.  Neuro: A/Ox3. No focal deficits noted.   Skin: Warm, no lesions or rashe Psych:  Normal affect and behavior. Judgement and thought content appropriate.     Lab Results:  CBC    Component Value Date/Time   WBC 23.2 (H) 05/24/2022 0425   RBC 4.33 05/24/2022 0425   HGB 12.7 05/24/2022 0425   HCT 39.3 05/24/2022 0425   PLT 300 05/24/2022 0425   MCV 90.8 05/24/2022 0425   MCH 29.3 05/24/2022 0425   MCHC 32.3 05/24/2022 0425   RDW 16.4 (H) 05/24/2022 0425   LYMPHSABS 3.3 05/23/2022 0610   MONOABS 0.4 05/23/2022 0610   EOSABS 0.0 05/23/2022 0610   BASOSABS 0.1 05/23/2022 0610    BMET    Component Value Date/Time   NA 137 05/24/2022 0425   K 4.4 05/24/2022 0425   CL 106 05/24/2022 0425   CO2 23 05/24/2022 0425   GLUCOSE 167 (H) 05/24/2022 0425  BUN 18 05/24/2022 0425   CREATININE 0.74 05/24/2022 0425   CALCIUM 9.6 05/24/2022 0425   GFRNONAA >60 05/24/2022 0425   GFRAA >60 03/29/2020 1009    BNP    Component Value Date/Time   BNP 46.0 05/23/2022 0610     Imaging:  No results found.       Latest Ref Rng & Units 10/06/2022    2:23 PM  PFT Results  FVC-Pre L 2.63   FVC-Predicted Pre % 65   FVC-Post L 2.64   FVC-Predicted Post % 65   Pre FEV1/FVC % % 84   Post FEV1/FCV % % 88   FEV1-Pre L 2.20   FEV1-Predicted Pre % 68   FEV1-Post L 2.33   DLCO uncorrected ml/min/mmHg 17.40   DLCO UNC% % 72   DLCO corrected ml/min/mmHg 17.40   DLCO COR %Predicted % 72   DLVA Predicted % 116   TLC L 4.18   TLC % Predicted % 74   RV % Predicted % 76     No results found for: "NITRICOXIDE"      Assessment & Plan:   No problem-specific Assessment & Plan notes found for this encounter.   I spent *** minutes of dedicated to the care of this patient on the date of this encounter to include pre-visit review of records, face-to-face time with the patient discussing conditions above, post visit ordering of testing, clinical documentation with the electronic health record, making appropriate referrals as documented, and communicating necessary  findings to members of the patients care team.  Clayton Bibles, NP 10/10/2022  Pt aware and understands NP's role.

## 2022-10-10 NOTE — Patient Instructions (Addendum)
Continue Breztri 2 puffs Twice daily. Brush tongue and rinse mouth afterwards Continue Albuterol inhaler 2 puffs or 3 mL every 6 hours as needed for shortness of breath or wheezing. Notify if symptoms persist despite rescue inhaler/neb use. Restart levocetirizine 5 mg daily for allergies - new prescription sent to pharmacy   -Prednisone 40 mg daily for 5 days. Take in AM with food. -Saline nasal rinses (Netti [employer] or [employer]) 1-2 times a day. Use the flonase about 20-30 min after -Flonase nasal spray 2 sprays each nostril daily for nasal congestion/drainage  -Mucinex DM (over the counter) Twice daily for cough/congestion  Start CPAP 5-15 cmH2O every night, minimum of 4-6 hours a night.  Change equipment every 30 days or as directed by DME. Wash your tubing with warm soap and water daily, hang to dry. Wash humidifier portion weekly.  Be aware of reduced alertness and do not drive or operate heavy machinery if experiencing this or drowsiness.  Exercise encouraged, as tolerated. Healthy weight management discussed.  Notify if persistent daytime sleepiness occurs even with consistent use of CPAP.  Follow up in 8-12 weeks with [name] or Jordan [place] Richard Holz,NP to see how CPAP is going and how asthma is doing. If symptoms do not improve or worsen, please contact office for sooner follow up or seek emergency care.

## 2022-10-10 NOTE — Progress Notes (Unsigned)
Patient ID: [name], female     DOB: 29-Mar-1971, 52 y.o.      MRN: [id number]  No chief complaint on file.   Virtual Visit via Video Note  I connected with Benjie Karvonen on 10/12/22 at 11:30 AM EDT by a video enabled telemedicine application and verified that I am speaking with the correct person using two identifiers.  Location: Patient: Home Provider: Patient   I discussed the limitations of evaluation and management by telemedicine and the availability of in person appointments. The patient expressed understanding and agreed to proceed.  History of Present Illness: 52 year old patient, never smoker followed for asthma and OSA. She is a patient of Dr. August Albino and last seen in office 06/28/2022. Past medical history significant for GERD, RA on methotrexate, bipolar, depression.  06/28/2022: OV with Dr. Erin Fulling for hospital follow up. She was admitted 11/6-11/8 for asthma exacerbation due to RSV infection. She had been having DOE since   10/10/2022: Today - follow up Patient presents today for follow up after undergoing PFTs and HST. She was found to have a restrictive defect and minimal diffusion defect on her pulmonary function testing. Her home sleep study revealed mildly moderate OSA. She has never been treated for this with CPAP before.  Today, she tells me that she was doing much better after she started the [facility] inhaler. Over the past few days (starting approx 3/21), she developed sinus congestion, sneezing, watery eyes. She thought it was allergies. She now has a cough and is feeling like her chest is a little tighter, especially when she lays back. She also notices more wheezing at night. She feels like she gets more short winded with activities around the house but doesn't feel like her shortness of breath is severe. She denies any fevers, chills, hemoptysis, leg swelling. She is using her Breztri twice a day. Increased use of albuterol over the past few days.  She is not currently on anything for allergies.  Regarding her sleep, she feels very tired during the day. She snores at night. She denies any drowsy driving or morning headaches. She would like to know what treatment options there are.   Allergies  Allergen Reactions   Peanut-Containing Drug Products Anaphylaxis    Swelling    Tegretol [Carbamazepine] Shortness Of Breath and Swelling   Lithium Other (See Comments)    Bad acne    Sweet Potato Itching   Penicillins Rash    There is no immunization history on file for this patient. Past Medical History:  Diagnosis Date   Anemia    Anxiety    Arthritis    orthopedic   Bipolar disorder (HCC)    Depression    Fibromyalgia    GERD (gastroesophageal reflux disease)    Hypotension    IBS (irritable bowel syndrome)    Migraine    PTSD (post-traumatic stress disorder)    on prazosin   Sleep apnea    Tonsillar hypertrophy    Uterine fibroid     Tobacco History: Social History   Tobacco Use  Smoking Status Never  Smokeless Tobacco Never   Counseling given: Not Answered   Outpatient Medications Prior to Visit  Medication Sig Dispense Refill   albuterol (VENTOLIN HFA) 108 (90 Base) MCG/ACT inhaler Inhale 1-2 puffs into the lungs every 6 (six) hours as needed for wheezing or shortness of breath.     allopurinol (ZYLOPRIM) 300 MG tablet Take 300 mg by mouth daily.  ALPRAZolam (XANAX) 0.5 MG tablet Take 0.5 mg by mouth 2 (two) times daily as needed for anxiety.     atorvastatin (LIPITOR) 40 MG tablet Take 40 mg by mouth daily.     Budeson-Glycopyrrol-Formoterol (BREZTRI AEROSPHERE) 160-9-4.8 MCG/ACT AERO Inhale 2 puffs into the lungs in the morning and at bedtime. 10.7 g 3   colchicine 0.6 MG tablet Take 0.6 mg by mouth daily.     DULoxetine (CYMBALTA) 60 MG capsule Take 60 mg by mouth daily.     folic acid (FOLVITE) 1 MG tablet Take 1 mg by mouth daily.     furosemide (LASIX) 20 MG tablet Take 1 tablet (20 mg total) by  mouth daily. 30 tablet 1   hydrOXYzine (ATARAX) 50 MG tablet Take 25 mg by mouth 2 (two) times daily as needed for itching.     ipratropium-albuterol (DUONEB) 0.5-2.5 (3) MG/3ML SOLN Take 3 mLs by nebulization every 6 (six) hours as needed.     LATUDA 40 MG TABS tablet Take 40 mg by mouth at bedtime.     metFORMIN (GLUCOPHAGE) 500 MG tablet Take 500 mg by mouth daily with breakfast.     methotrexate 2.5 MG tablet Take 25 mg by mouth once a week. Caution:Chemotherapy. Protect from light.  Takes 10 per day once a week.     mirabegron ER (MYRBETRIQ) 25 MG TB24 tablet Take 25 mg by mouth daily.     Oxcarbazepine (TRILEPTAL) 300 MG tablet Take 1 tablet (300 mg total) by mouth 2 (two) times daily. (Patient taking differently: Take 600 mg by mouth at bedtime.) 60 tablet 2   pantoprazole (PROTONIX) 40 MG tablet Take 1 tablet (40 mg total) by mouth daily. 30 tablet 1   prazosin (MINIPRESS) 5 MG capsule Take 5 mg by mouth 2 (two) times daily.     pregabalin (LYRICA) 75 MG capsule Take 1 capsule by mouth daily.     vitamin B-12 (CYANOCOBALAMIN) 100 MCG tablet Take 100 mcg by mouth daily.     levocetirizine (XYZAL) 5 MG tablet Take 5 mg by mouth daily.     pantoprazole (PROTONIX) 40 MG tablet Take 1 tablet (40 mg total) by mouth daily. 30 tablet 1   azithromycin (ZITHROMAX) 250 MG tablet Take as directed (Patient not taking: Reported on 08/15/2022) 6 tablet 0   budesonide-formoterol (SYMBICORT) 160-4.5 MCG/ACT inhaler Inhale 2 puffs into the lungs in the morning and at bedtime. (Patient not taking: Reported on 08/15/2022) 1 each 4   No facility-administered medications prior to visit.     Review of Systems:   Constitutional: No weight loss or gain, night sweats, fevers, chills, or lassitude. +fatigue  HEENT: No headaches, difficulty swallowing, tooth/dental problems, or sore throat. No itching, ear ache. +nasal congestion, post nasal drip, sneezing, watery eyes CV:  No chest pain, orthopnea, PND,  swelling in lower extremities, anasarca, dizziness, palpitations, syncope Resp: +snoring, shortness of breath with exertion; dry cough; chest tightness; wheezing. No excess mucus or change in color of mucus. No hemoptysis.  No chest wall deformity GI:  No heartburn, indigestion, abdominal pain, nausea, vomiting, diarrhea, change in bowel habits, loss of appetite, bloody stools.  GU: No dysuria, change in color of urine, urgency or frequency.  Skin: No rash, lesions, ulcerations MSK:  No joint pain or swelling.   Neuro: No dizziness or lightheadedness.  Psych: No depression or anxiety. Mood stable. +sleep disturbance   Observations/Objective: Patient is well-developed, well-nourished in no acute distress. A&Ox3. Resting comfortably at home. Unlabored  breathing. Speech is clear and coherent with logical content.    Assessment and Plan: Asthma exacerbation Mild asthma exacerbation likely secondary to allergies. We will treat her with prednisone burst. She will continue Breztri and prn SABA. Recommended she restart daily antihistamine. Target postnasal drainage therapies. Action plan in place.   Patient Instructions  Continue Breztri 2 puffs Twice daily. Brush tongue and rinse mouth afterwards Continue Albuterol inhaler 2 puffs or 3 mL every 6 hours as needed for shortness of breath or wheezing. Notify if symptoms persist despite rescue inhaler/neb use. Restart levocetirizine 5 mg daily for allergies - new prescription sent to pharmacy   -Prednisone 40 mg daily for 5 days. Take in AM with food. -Saline nasal rinses (Netti Pot or [employer]) 1-2 times a day. Use the flonase about 20-30 min after -Flonase nasal spray 2 sprays each nostril daily for nasal congestion/drainage  -Mucinex DM (over the counter) Twice daily for cough/congestion  Start CPAP 5-15 cmH2O every night, minimum of 4-6 hours a night.  Change equipment every 30 days or as directed by DME. Wash your tubing with warm soap  and water daily, hang to dry. Wash humidifier portion weekly.  Be aware of reduced alertness and do not drive or operate heavy machinery if experiencing this or drowsiness.  Exercise encouraged, as tolerated. Healthy weight management discussed.  Notify if persistent daytime sleepiness occurs even with consistent use of CPAP.  Follow up in 8-12 weeks with Dr. Erin Fulling or Joellen Jersey Elika Godar,NP to see how CPAP is going and how asthma is doing. If symptoms do not improve or worsen, please contact office for sooner follow up or seek emergency care.    Mild obstructive sleep apnea Mildly moderate OSA. We discussed risks of untreated sleep apnea and potential treatment options. She would like to start CPAP therapy given her significant daytime symptoms. We will send orders for auto CPAP 5-15 cmH2O, mask of choice ad heated humidification. Cautioned on safe driving practices.   Allergic rhinitis See above    I discussed the assessment and treatment plan with the patient. The patient was provided an opportunity to ask questions and all were answered. The patient agreed with the plan and demonstrated an understanding of the instructions.   The patient was advised to call back or seek an in-person evaluation if the symptoms worsen or if the condition fails to improve as anticipated.  I provided 32 minutes of non-face-to-face time during this encounter.   Clayton Bibles, NP

## 2022-10-12 ENCOUNTER — Encounter: Payer: Self-pay | Admitting: Nurse Practitioner

## 2022-10-12 DIAGNOSIS — J309 Allergic rhinitis, unspecified: Secondary | ICD-10-CM | POA: Insufficient documentation

## 2022-10-12 DIAGNOSIS — G4733 Obstructive sleep apnea (adult) (pediatric): Secondary | ICD-10-CM | POA: Insufficient documentation

## 2022-10-12 NOTE — Assessment & Plan Note (Signed)
Mild asthma exacerbation likely secondary to allergies. We will treat her with prednisone burst. She will continue Breztri and prn SABA. Recommended she restart daily antihistamine. Target postnasal drainage therapies. Action plan in place.   Patient Instructions  Continue Breztri 2 puffs Twice daily. Brush tongue and rinse mouth afterwards Continue Albuterol inhaler 2 puffs or 3 mL every 6 hours as needed for shortness of breath or wheezing. Notify if symptoms persist despite rescue inhaler/neb use. Restart levocetirizine 5 mg daily for allergies - new prescription sent to pharmacy   -Prednisone 40 mg daily for 5 days. Take in AM with food. -Saline nasal rinses ([employer] or [employer]) 1-2 times a day. Use the flonase about 20-30 min after -Flonase nasal spray 2 sprays each nostril daily for nasal congestion/drainage  -Mucinex DM (over the counter) Twice daily for cough/congestion  Start CPAP 5-15 cmH2O every night, minimum of 4-6 hours a night.  Change equipment every 30 days or as directed by DME. Wash your tubing with warm soap and water daily, hang to dry. Wash humidifier portion weekly.  Be aware of reduced alertness and do not drive or operate heavy machinery if experiencing this or drowsiness.  Exercise encouraged, as tolerated. Healthy weight management discussed.  Notify if persistent daytime sleepiness occurs even with consistent use of CPAP.  Follow up in 8-12 weeks with Dr. Erin Fulling or Joellen Jersey Jenya Putz,NP to see how CPAP is going and how asthma is doing. If symptoms do not improve or worsen, please contact office for sooner follow up or seek emergency care.

## 2022-10-12 NOTE — Assessment & Plan Note (Signed)
Mildly moderate OSA. We discussed risks of untreated sleep apnea and potential treatment options. She would like to start CPAP therapy given her significant daytime symptoms. We will send orders for auto CPAP 5-15 cmH2O, mask of choice ad heated humidification. Cautioned on safe driving practices.

## 2022-10-12 NOTE — Assessment & Plan Note (Signed)
See above

## 2022-10-13 DIAGNOSIS — R7309 Other abnormal glucose: Secondary | ICD-10-CM | POA: Diagnosis not present

## 2022-10-13 DIAGNOSIS — Z299 Encounter for prophylactic measures, unspecified: Secondary | ICD-10-CM | POA: Diagnosis not present

## 2022-10-13 DIAGNOSIS — E11649 Type 2 diabetes mellitus with hypoglycemia without coma: Secondary | ICD-10-CM | POA: Diagnosis not present

## 2022-10-31 DIAGNOSIS — G4733 Obstructive sleep apnea (adult) (pediatric): Secondary | ICD-10-CM | POA: Diagnosis not present

## 2022-11-03 ENCOUNTER — Telehealth: Payer: Self-pay | Admitting: Nurse Practitioner

## 2022-11-03 NOTE — Telephone Encounter (Signed)
Called and spoke with the pt to move her appt with Dr [name] for 11/21/22  She was started on CPAP 11/30/22 and needed appt between 11/30/22-01/29/23   I moved her visit to 12/08/22

## 2022-11-21 ENCOUNTER — Ambulatory Visit: Payer: 59 | Admitting: Pulmonary Disease

## 2022-11-30 DIAGNOSIS — G4733 Obstructive sleep apnea (adult) (pediatric): Secondary | ICD-10-CM | POA: Diagnosis not present

## 2022-12-05 DIAGNOSIS — E11649 Type 2 diabetes mellitus with hypoglycemia without coma: Secondary | ICD-10-CM | POA: Diagnosis not present

## 2022-12-05 DIAGNOSIS — Z7189 Other specified counseling: Secondary | ICD-10-CM | POA: Diagnosis not present

## 2022-12-05 DIAGNOSIS — Z299 Encounter for prophylactic measures, unspecified: Secondary | ICD-10-CM | POA: Diagnosis not present

## 2022-12-05 DIAGNOSIS — Z0000 Encounter for general adult medical examination without abnormal findings: Secondary | ICD-10-CM | POA: Diagnosis not present

## 2022-12-08 ENCOUNTER — Ambulatory Visit: Payer: 59 | Admitting: Pulmonary Disease

## 2022-12-08 DIAGNOSIS — W57XXXA Bitten or stung by nonvenomous insect and other nonvenomous arthropods, initial encounter: Secondary | ICD-10-CM | POA: Diagnosis not present

## 2022-12-08 DIAGNOSIS — S80862A Insect bite (nonvenomous), left lower leg, initial encounter: Secondary | ICD-10-CM | POA: Diagnosis not present

## 2022-12-08 NOTE — Progress Notes (Deleted)
Synopsis: Referred in December 2023 for hospital follow up  Subjective:   PATIENT ID: [name] GENDER: female DOB: June 12, 1971, MRN: [id number]  HPI  No chief complaint on file.  [name] is a 52 year old woman, never smoker with sleep apnea, rheumatoid arthritis on methotrexate and asthma who returns to pulmonary clinic.   Initial OV 06/28/22 She was admitted 11/6 to 11/8 for asthma exacerbation due to RSV infection. She was volunteering at [employer] when she picked up the infection. She continues to have shortness of breath when leaning forward since April of this year. She did have wheezing and cough prior to her RSV infection.  She does report some seasonal allergies but reports that the therapy to her bothers her breathing most.  She has been using Symbicort 2 puffs twice daily 160/4.5 mcg with some relief.  Continues to have a chest discomfort/pressure since being in the hospital.  She does not feel this is related to anxiety and Xanax does not help this symptoms.  She reports urinating 3 times per night, she has orthopnea and paroxysmal nocturnal dyspnea.  She does report snoring in her sleep.  She has gained significant amount of weight since 2002 and starting medication for her mood.  Today 12/08/22 She was seen in video visit 10/10/22 by Rhunette Croft, NP for follow up of PFTs and HST. PFTs showed mild  restriction. HST showed mild to moderate OSA. Based on her symptoms, she was started on CPAP at last visit, auto-titrating 5-15 cmH2O.      Past Medical History:  Diagnosis Date   Anemia    Anxiety    Arthritis    orthopedic   Bipolar disorder (HCC)    Depression    Fibromyalgia    GERD (gastroesophageal reflux disease)    Hypotension    IBS (irritable bowel syndrome)    Migraine    PTSD (post-traumatic stress disorder)    on prazosin   Sleep apnea    Tonsillar hypertrophy    Uterine fibroid      Family History  Problem Relation Age of  Onset   Depression Mother    Hypertension Mother    Hyperlipidemia Mother    Other Mother        bone degeneration   Depression Brother    Alcohol abuse Brother    Alcohol abuse Brother    Hypertension Father    Hyperlipidemia Father    Heart attack Father    Diabetes Father    Bipolar disorder Son    ADD / ADHD Son    Bipolar disorder Son    ADD / ADHD Son    Asperger's syndrome Son    Bipolar disorder Daughter      Social History   Socioeconomic History   Marital status: Divorced    Spouse name: Not on file   Number of children: Not on file   Years of education: Not on file   Highest education level: Not on file  Occupational History   Not on file  Tobacco Use   Smoking status: Never   Smokeless tobacco: Never  Vaping Use   Vaping Use: Never used  Substance and Sexual Activity   Alcohol use: No   Drug use: No   Sexual activity: Not Currently    Birth control/protection: Surgical    Comment: tubal and ablation  Other Topics Concern   Not on file  Social History Narrative   Not on file   Social Determinants  of Health   Financial Resource Strain: Not on file  Food Insecurity: Not on file  Transportation Needs: Not on file  Physical Activity: Not on file  Stress: Not on file  Social Connections: Not on file  Intimate Partner Violence: Not on file     Allergies  Allergen Reactions   Peanut-Containing Drug Products Anaphylaxis    Swelling    Tegretol [Carbamazepine] Shortness Of Breath and Swelling   Lithium Other (See Comments)    Bad acne    Sweet Potato Itching   Penicillins Rash     Outpatient Medications Prior to Visit  Medication Sig Dispense Refill   albuterol (VENTOLIN HFA) 108 (90 Base) MCG/ACT inhaler Inhale 1-2 puffs into the lungs every 6 (six) hours as needed for wheezing or shortness of breath.     allopurinol (ZYLOPRIM) 300 MG tablet Take 300 mg by mouth daily.     ALPRAZolam (XANAX) 0.5 MG tablet Take 0.5 mg by mouth 2 (two) times  daily as needed for anxiety.     atorvastatin (LIPITOR) 40 MG tablet Take 40 mg by mouth daily.     Budeson-Glycopyrrol-Formoterol (BREZTRI AEROSPHERE) 160-9-4.8 MCG/ACT AERO Inhale 2 puffs into the lungs in the morning and at bedtime. 10.7 g 3   colchicine 0.6 MG tablet Take 0.6 mg by mouth daily.     DULoxetine (CYMBALTA) 60 MG capsule Take 60 mg by mouth daily.     fluticasone (FLONASE) 50 MCG/ACT nasal spray Place 2 sprays into both nostrils daily. 18.2 mL 2   folic acid (FOLVITE) 1 MG tablet Take 1 mg by mouth daily.     furosemide (LASIX) 20 MG tablet Take 1 tablet (20 mg total) by mouth daily. 30 tablet 1   hydrOXYzine (ATARAX) 50 MG tablet Take 25 mg by mouth 2 (two) times daily as needed for itching.     ipratropium-albuterol (DUONEB) 0.5-2.5 (3) MG/3ML SOLN Take 3 mLs by nebulization every 6 (six) hours as needed.     LATUDA 40 MG TABS tablet Take 40 mg by mouth at bedtime.     levocetirizine (XYZAL) 5 MG tablet Take 1 tablet (5 mg total) by mouth daily. 30 tablet 5   metFORMIN (GLUCOPHAGE) 500 MG tablet Take 500 mg by mouth daily with breakfast.     methotrexate 2.5 MG tablet Take 25 mg by mouth once a week. Caution:Chemotherapy. Protect from light.  Takes 10 per day once a week.     mirabegron ER (MYRBETRIQ) 25 MG TB24 tablet Take 25 mg by mouth daily.     Oxcarbazepine (TRILEPTAL) 300 MG tablet Take 1 tablet (300 mg total) by mouth 2 (two) times daily. (Patient taking differently: Take 600 mg by mouth at bedtime.) 60 tablet 2   pantoprazole (PROTONIX) 40 MG tablet Take 1 tablet (40 mg total) by mouth daily. 30 tablet 1   pantoprazole (PROTONIX) 40 MG tablet Take 1 tablet (40 mg total) by mouth daily. 30 tablet 1   prazosin (MINIPRESS) 5 MG capsule Take 5 mg by mouth 2 (two) times daily.     pregabalin (LYRICA) 75 MG capsule Take 1 capsule by mouth daily.     vitamin B-12 (CYANOCOBALAMIN) 100 MCG tablet Take 100 mcg by mouth daily.     No facility-administered medications prior to  visit.    Review of Systems  Constitutional:  Negative for chills, fever, malaise/fatigue and weight loss.  HENT:  Negative for congestion, sinus pain and sore throat.   Eyes: Negative.   Respiratory:  Positive for cough, shortness of breath and wheezing. Negative for hemoptysis and sputum production.   Cardiovascular:  Positive for orthopnea, leg swelling and PND. Negative for chest pain, palpitations and claudication.  Gastrointestinal:  Negative for abdominal pain, heartburn, nausea and vomiting.  Genitourinary: Negative.   Musculoskeletal:  Negative for joint pain and myalgias.  Skin:  Negative for rash.  Neurological:  Negative for weakness.  Endo/Heme/Allergies: Negative.   Psychiatric/Behavioral: Negative.     Objective:   There were no vitals filed for this visit.  Physical Exam Constitutional:      General: She is not in acute distress.    Appearance: She is not ill-appearing.  HENT:     Head: Normocephalic and atraumatic.  Eyes:     General: No scleral icterus.    Conjunctiva/sclera: Conjunctivae normal.     Pupils: Pupils are equal, round, and reactive to light.  Cardiovascular:     Rate and Rhythm: Normal rate and regular rhythm.     Pulses: Normal pulses.     Heart sounds: Normal heart sounds. No murmur heard. Pulmonary:     Effort: Pulmonary effort is normal.     Breath sounds: Normal breath sounds. No wheezing, rhonchi or rales.  Abdominal:     General: Bowel sounds are normal.     Palpations: Abdomen is soft.  Musculoskeletal:     Right lower leg: Edema present.     Left lower leg: Edema present.  Lymphadenopathy:     Cervical: No cervical adenopathy.  Skin:    General: Skin is warm and dry.  Neurological:     General: No focal deficit present.     Mental Status: She is alert.  Psychiatric:        Mood and Affect: Mood normal.        Behavior: Behavior normal.        Thought Content: Thought content normal.        Judgment: Judgment normal.        CBC    Component Value Date/Time   WBC 23.2 (H) 05/24/2022 0425   RBC 4.33 05/24/2022 0425   HGB 12.7 05/24/2022 0425   HCT 39.3 05/24/2022 0425   PLT 300 05/24/2022 0425   MCV 90.8 05/24/2022 0425   MCH 29.3 05/24/2022 0425   MCHC 32.3 05/24/2022 0425   RDW 16.4 (H) 05/24/2022 0425   LYMPHSABS 3.3 05/23/2022 0610   MONOABS 0.4 05/23/2022 0610   EOSABS 0.0 05/23/2022 0610   BASOSABS 0.1 05/23/2022 0610      Latest Ref Rng & Units 05/24/2022    4:25 AM 05/23/2022    6:10 AM 07/22/2021   12:30 AM  BMP  Glucose 70 - 99 mg/dL 409  811  914   BUN 6 - 20 mg/dL 18  14  14    Creatinine 0.44 - 1.00 mg/dL 7.82  9.56  2.13   Sodium 135 - 145 mmol/L 137  137  132   Potassium 3.5 - 5.1 mmol/L 4.4  3.4  3.5   Chloride 98 - 111 mmol/L 106  108  102   CO2 22 - 32 mmol/L 23  22  22    Calcium 8.9 - 10.3 mg/dL 9.6  9.6  9.1    Chest imaging: CTA Chest 05/23/22 Cardiovascular: Satisfactory opacification of the pulmonary arteries to the segmental level. No evidence of pulmonary embolism. Normal heart size. No pericardial effusion.   Mediastinum/Nodes: No enlarged mediastinal, hilar, or axillary lymph nodes. Thyroid gland, trachea, and esophagus  demonstrate no significant findings.   Lungs/Pleura: Small bilateral pleural effusions with mild overlying dependent atelectasis. Otherwise, clear lungs. No pneumothorax. 5 mm pulmonary nodule in the right upper lobe (series 6, image 53).  PFT:    Latest Ref Rng & Units 10/06/2022    2:23 PM  PFT Results  FVC-Pre L 2.63   FVC-Predicted Pre % 65   FVC-Post L 2.64   FVC-Predicted Post % 65   Pre FEV1/FVC % % 84   Post FEV1/FCV % % 88   FEV1-Pre L 2.20   FEV1-Predicted Pre % 68   FEV1-Post L 2.33   DLCO uncorrected ml/min/mmHg 17.40   DLCO UNC% % 72   DLCO corrected ml/min/mmHg 17.40   DLCO COR %Predicted % 72   DLVA Predicted % 116   TLC L 4.18   TLC % Predicted % 74   RV % Predicted % 76      Labs:  Path:  Echo:  Heart Catheterization:       Assessment & Plan:   No diagnosis found.  Discussion: [name] is a 52 year old woman, never smoker with sleep apnea, rheumatoid arthritis on methotrexate and asthma who is referred to pulmonary clinic for hospital follow up.  Recent CTA chest scan did not show involvement of pulmonary emboli did not show significant parenchymal lung involvement concerning for interstitial lung disease related to her history of rheumatoid arthritis.  Based on history she does have orthopnea, paroxysmal nocturnal dyspnea and lower extremity edema concerning for heart failure.  Recent echo during hospitalization showed hyperdynamic LV with EF greater than 75%.  We will refer her to cardiology for further evaluation.  We will check home sleep study.  She is to try Breztri 2 puffs twice daily and monitor for any improvement in her cough or wheezing symptoms and shortness of breath.  We will send in prescription if she has noticed improvement.  Follow-up in 2 months with pulmonary function testing.  Melody Comas, MD [facility] Office: [phone]   Current Outpatient Medications:    albuterol (VENTOLIN HFA) 108 (90 Base) MCG/ACT inhaler, Inhale 1-2 puffs into the lungs every 6 (six) hours as needed for wheezing or shortness of breath., Disp: , Rfl:    allopurinol (ZYLOPRIM) 300 MG tablet, Take 300 mg by mouth daily., Disp: , Rfl:    ALPRAZolam (XANAX) 0.5 MG tablet, Take 0.5 mg by mouth 2 (two) times daily as needed for anxiety., Disp: , Rfl:    atorvastatin (LIPITOR) 40 MG tablet, Take 40 mg by mouth daily., Disp: , Rfl:    Budeson-Glycopyrrol-Formoterol (BREZTRI AEROSPHERE) 160-9-4.8 MCG/ACT AERO, Inhale 2 puffs into the lungs in the morning and at bedtime., Disp: 10.7 g, Rfl: 3   colchicine 0.6 MG tablet, Take 0.6 mg by mouth daily., Disp: , Rfl:    DULoxetine (CYMBALTA) 60 MG capsule, Take 60 mg by mouth  daily., Disp: , Rfl:    fluticasone (FLONASE) 50 MCG/ACT nasal spray, Place 2 sprays into both nostrils daily., Disp: 18.2 mL, Rfl: 2   folic acid (FOLVITE) 1 MG tablet, Take 1 mg by mouth daily., Disp: , Rfl:    furosemide (LASIX) 20 MG tablet, Take 1 tablet (20 mg total) by mouth daily., Disp: 30 tablet, Rfl: 1   hydrOXYzine (ATARAX) 50 MG tablet, Take 25 mg by mouth 2 (two) times daily as needed for itching., Disp: , Rfl:    ipratropium-albuterol (DUONEB) 0.5-2.5 (3) MG/3ML SOLN, Take 3 mLs by nebulization every  6 (six) hours as needed., Disp: , Rfl:    LATUDA 40 MG TABS tablet, Take 40 mg by mouth at bedtime., Disp: , Rfl:    levocetirizine (XYZAL) 5 MG tablet, Take 1 tablet (5 mg total) by mouth daily., Disp: 30 tablet, Rfl: 5   metFORMIN (GLUCOPHAGE) 500 MG tablet, Take 500 mg by mouth daily with breakfast., Disp: , Rfl:    methotrexate 2.5 MG tablet, Take 25 mg by mouth once a week. Caution:Chemotherapy. Protect from light.  Takes 10 per day once a week., Disp: , Rfl:    mirabegron ER (MYRBETRIQ) 25 MG TB24 tablet, Take 25 mg by mouth daily., Disp: , Rfl:    Oxcarbazepine (TRILEPTAL) 300 MG tablet, Take 1 tablet (300 mg total) by mouth 2 (two) times daily. (Patient taking differently: Take 600 mg by mouth at bedtime.), Disp: 60 tablet, Rfl: 2   pantoprazole (PROTONIX) 40 MG tablet, Take 1 tablet (40 mg total) by mouth daily., Disp: 30 tablet, Rfl: 1   pantoprazole (PROTONIX) 40 MG tablet, Take 1 tablet (40 mg total) by mouth daily., Disp: 30 tablet, Rfl: 1   prazosin (MINIPRESS) 5 MG capsule, Take 5 mg by mouth 2 (two) times daily., Disp: , Rfl:    pregabalin (LYRICA) 75 MG capsule, Take 1 capsule by mouth daily., Disp: , Rfl:    vitamin B-12 (CYANOCOBALAMIN) 100 MCG tablet, Take 100 mcg by mouth daily., Disp: , Rfl:

## 2022-12-09 ENCOUNTER — Encounter: Payer: Self-pay | Admitting: Pulmonary Disease

## 2022-12-09 DIAGNOSIS — E11649 Type 2 diabetes mellitus with hypoglycemia without coma: Secondary | ICD-10-CM | POA: Diagnosis not present

## 2022-12-09 DIAGNOSIS — R52 Pain, unspecified: Secondary | ICD-10-CM | POA: Diagnosis not present

## 2022-12-09 DIAGNOSIS — W57XXXA Bitten or stung by nonvenomous insect and other nonvenomous arthropods, initial encounter: Secondary | ICD-10-CM | POA: Diagnosis not present

## 2022-12-09 DIAGNOSIS — Z299 Encounter for prophylactic measures, unspecified: Secondary | ICD-10-CM | POA: Diagnosis not present

## 2022-12-25 ENCOUNTER — Encounter (HOSPITAL_COMMUNITY): Payer: Self-pay | Admitting: Emergency Medicine

## 2022-12-25 ENCOUNTER — Emergency Department (HOSPITAL_COMMUNITY)
Admission: EM | Admit: 2022-12-25 | Discharge: 2022-12-25 | Disposition: A | Discharge status: Home / Self Care | Payer: 59 | Attending: Emergency Medicine | Admitting: Emergency Medicine

## 2022-12-25 ENCOUNTER — Emergency Department (HOSPITAL_COMMUNITY): Payer: 59

## 2022-12-25 ENCOUNTER — Other Ambulatory Visit: Payer: Self-pay

## 2022-12-25 DIAGNOSIS — M25539 Pain in unspecified wrist: Secondary | ICD-10-CM | POA: Diagnosis not present

## 2022-12-25 DIAGNOSIS — R059 Cough, unspecified: Secondary | ICD-10-CM | POA: Diagnosis not present

## 2022-12-25 DIAGNOSIS — M25562 Pain in left knee: Secondary | ICD-10-CM | POA: Diagnosis not present

## 2022-12-25 DIAGNOSIS — R051 Acute cough: Secondary | ICD-10-CM | POA: Diagnosis not present

## 2022-12-25 DIAGNOSIS — Z7984 Long term (current) use of oral hypoglycemic drugs: Secondary | ICD-10-CM | POA: Insufficient documentation

## 2022-12-25 DIAGNOSIS — M2550 Pain in unspecified joint: Secondary | ICD-10-CM

## 2022-12-25 DIAGNOSIS — Z20822 Contact with and (suspected) exposure to covid-19: Secondary | ICD-10-CM | POA: Diagnosis not present

## 2022-12-25 DIAGNOSIS — J45909 Unspecified asthma, uncomplicated: Secondary | ICD-10-CM | POA: Insufficient documentation

## 2022-12-25 DIAGNOSIS — Z91010 Allergy to peanuts: Secondary | ICD-10-CM | POA: Diagnosis not present

## 2022-12-25 DIAGNOSIS — M791 Myalgia, unspecified site: Secondary | ICD-10-CM | POA: Diagnosis not present

## 2022-12-25 LAB — RESP PANEL BY RT-PCR (RSV, FLU A&B, COVID)  RVPGX2
Influenza A by PCR: NEGATIVE
Influenza B by PCR: NEGATIVE
Resp Syncytial Virus by PCR: NEGATIVE
SARS Coronavirus 2 by RT PCR: NEGATIVE

## 2022-12-25 LAB — CBC WITH DIFFERENTIAL/PLATELET
Abs Immature Granulocytes: 0.04 10*3/uL (ref 0.00–0.07)
Basophils Absolute: 0 10*3/uL (ref 0.0–0.1)
Basophils Relative: 0 %
Eosinophils Absolute: 0.1 10*3/uL (ref 0.0–0.5)
Eosinophils Relative: 1 %
HCT: 34.4 % — ABNORMAL LOW (ref 36.0–46.0)
Hemoglobin: 11 g/dL — ABNORMAL LOW (ref 12.0–15.0)
Immature Granulocytes: 1 %
Lymphocytes Relative: 17 %
Lymphs Abs: 1.4 10*3/uL (ref 0.7–4.0)
MCH: 29.6 pg (ref 26.0–34.0)
MCHC: 32 g/dL (ref 30.0–36.0)
MCV: 92.5 fL (ref 80.0–100.0)
Monocytes Absolute: 0.9 10*3/uL (ref 0.1–1.0)
Monocytes Relative: 11 %
Neutro Abs: 5.7 10*3/uL (ref 1.7–7.7)
Neutrophils Relative %: 70 %
Platelets: 220 10*3/uL (ref 150–400)
RBC: 3.72 MIL/uL — ABNORMAL LOW (ref 3.87–5.11)
RDW: 15.6 % — ABNORMAL HIGH (ref 11.5–15.5)
WBC: 8 10*3/uL (ref 4.0–10.5)
nRBC: 0 % (ref 0.0–0.2)

## 2022-12-25 LAB — BASIC METABOLIC PANEL
Anion gap: 7 (ref 5–15)
BUN: 11 mg/dL (ref 6–20)
CO2: 24 mmol/L (ref 22–32)
Calcium: 9.5 mg/dL (ref 8.9–10.3)
Chloride: 104 mmol/L (ref 98–111)
Creatinine, Ser: 0.74 mg/dL (ref 0.44–1.00)
GFR, Estimated: >60 mL/min (ref >60)
Glucose, Bld: 134 mg/dL — ABNORMAL HIGH (ref 70–99)
Potassium: 3.5 mmol/L (ref 3.5–5.1)
Sodium: 135 mmol/L (ref 135–145)

## 2022-12-25 MED ORDER — HYDROCODONE-ACETAMINOPHEN 5-325 MG PO TABS: 1.0000 | ORAL_TABLET | Freq: Four times a day (QID) | ORAL | 0 refills | Status: DC | PRN

## 2022-12-25 MED ORDER — IPRATROPIUM-ALBUTEROL 0.5-2.5 (3) MG/3ML IN SOLN
3.0000 mL | Freq: Once | RESPIRATORY_TRACT | Status: AC
  Administered 2022-12-25: 3 mL via RESPIRATORY_TRACT
  Filled 2022-12-25: qty 3

## 2022-12-25 MED ORDER — KETOROLAC TROMETHAMINE 10 MG PO TABS
10.0000 mg | ORAL_TABLET | Freq: Once | ORAL | Status: AC
  Administered 2022-12-25: 10 mg via ORAL
  Filled 2022-12-25: qty 1

## 2022-12-25 MED ORDER — HYDROCODONE-ACETAMINOPHEN 5-325 MG PO TABS
1.0000 | ORAL_TABLET | Freq: Once | ORAL | Status: AC
  Administered 2022-12-25: 1 via ORAL
  Filled 2022-12-25: qty 1

## 2022-12-25 MED ORDER — PREDNISONE 50 MG PO TABS
60.0000 mg | ORAL_TABLET | Freq: Once | ORAL | Status: DC
  Filled 2022-12-25: qty 1

## 2022-12-25 NOTE — ED Triage Notes (Signed)
Pt c/o generalized body aches, burning sensation in chest, nausea, chills, states she recently "bite by two ticks and started on doxycycline"

## 2022-12-25 NOTE — ED Provider Notes (Signed)
[place] EMERGENCY DEPARTMENT AT [facility] Provider Note   CSN: [id number] Arrival date & time: 12/25/22  1010     History  Chief Complaint  Patient presents with   Generalized Body Aches    [name] is a 52 y.o. female. He has history of asthma and RA on methotrexate.  Presents the ER complaining of wheezing, increased cough productive of sputum and burning when she breathes in her lungs bilaterally for the last 2 to 3 days, worse this morning.  She took her inhalers this morning with some improvement.  She states she has history of getting bronchitis and pneumonia with hospitalization in the past for this and wanted to get checked out before it got worse.  Notes prior to her getting symptoms she was around her grandson's mother who had a "bad cough".  States that presented gone to urgent care was told she was fine.   She also notes she has been on doxycycline for the past 2 weeks for tick bites, and feels like her rheumatoid arthritis is flaring due to this. HPI     Home Medications Prior to Admission medications   Medication Sig Start Date End Date Taking? Authorizing Provider  HYDROcodone-acetaminophen (NORCO) 5-325 MG tablet Take 1 tablet by mouth every 6 (six) hours as needed for moderate pain. 12/25/22  Yes Emrie Gayle A, PA-C  albuterol (VENTOLIN HFA) 108 (90 Base) MCG/ACT inhaler Inhale 1-2 puffs into the lungs every 6 (six) hours as needed for wheezing or shortness of breath. 05/11/22   [provider]  allopurinol (ZYLOPRIM) 300 MG tablet Take 300 mg by mouth daily. 05/11/22   [provider]  ALPRAZolam Prudy Feeler) 0.5 MG tablet Take 0.5 mg by mouth 2 (two) times daily as needed for anxiety. 06/29/19   [provider]  atorvastatin (LIPITOR) 40 MG tablet Take 40 mg by mouth daily.    [provider]  Budeson-Glycopyrrol-Formoterol (BREZTRI AEROSPHERE) 160-9-4.8 MCG/ACT AERO Inhale 2 puffs into the lungs in the  morning and at bedtime. 08/11/22   Martina Sinner, MD  colchicine 0.6 MG tablet Take 0.6 mg by mouth daily. 05/11/22   [provider]  DULoxetine (CYMBALTA) 60 MG capsule Take 60 mg by mouth daily. 06/29/19   [provider]  fluticasone (FLONASE) 50 MCG/ACT nasal spray Place 2 sprays into both nostrils daily. 10/10/22   Cobb, Ruby Cola, NP  folic acid (FOLVITE) 1 MG tablet Take 1 mg by mouth daily.    [provider]  furosemide (LASIX) 20 MG tablet Take 1 tablet (20 mg total) by mouth daily. 07/15/22   Omar Person, MD  hydrOXYzine (ATARAX) 50 MG tablet Take 25 mg by mouth 2 (two) times daily as needed for itching.    [provider]  ipratropium-albuterol (DUONEB) 0.5-2.5 (3) MG/3ML SOLN Take 3 mLs by nebulization every 6 (six) hours as needed. 06/10/22   [provider]  LATUDA 40 MG TABS tablet Take 40 mg by mouth at bedtime. 06/29/19   [provider]  levocetirizine (XYZAL) 5 MG tablet Take 1 tablet (5 mg total) by mouth daily. 10/10/22   Cobb, Ruby Cola, NP  metFORMIN (GLUCOPHAGE) 500 MG tablet Take 500 mg by mouth daily with breakfast.    [provider]  methotrexate 2.5 MG tablet Take 25 mg by mouth once a week. Caution:Chemotherapy. Protect from light.  Takes 10 per day once a week.    [provider]  mirabegron ER (MYRBETRIQ) 25  MG TB24 tablet Take 25 mg by mouth daily.    [provider]  Oxcarbazepine (TRILEPTAL) 300 MG tablet Take 1 tablet (300 mg total) by mouth 2 (two) times daily. Patient taking differently: Take 600 mg by mouth at bedtime. 11/07/13   Myrlene Broker, MD  pantoprazole (PROTONIX) 40 MG tablet Take 1 tablet (40 mg total) by mouth daily. 05/25/22 08/15/22  Vassie Loll, MD  pantoprazole (PROTONIX) 40 MG tablet Take 1 tablet (40 mg total) by mouth daily. 05/25/22 05/25/23  Vassie Loll, MD  prazosin (MINIPRESS) 5 MG capsule Take 5 mg by mouth 2 (two) times daily.     [provider]  pregabalin (LYRICA) 75 MG capsule Take 1 capsule by mouth daily. 06/29/19   [provider]  vitamin B-12 (CYANOCOBALAMIN) 100 MCG tablet Take 100 mcg by mouth daily.    [provider]      Allergies    Peanut-containing drug products, Tegretol [carbamazepine], Lithium, Sweet potato, and Penicillins    Review of Systems   Review of Systems  Physical Exam Updated Vital Signs BP 129/80   Pulse 87   Temp 99.1 F (37.3 C) (Oral)   Resp 19   SpO2 100%  Physical Exam Vitals and nursing note reviewed.  Constitutional:      General: She is not in acute distress.    Appearance: She is well-developed.  HENT:     Head: Normocephalic and atraumatic.     Mouth/Throat:     Mouth: Mucous membranes are moist.  Eyes:     Conjunctiva/sclera: Conjunctivae normal.  Cardiovascular:     Rate and Rhythm: Normal rate and regular rhythm.     Heart sounds: No murmur heard. Pulmonary:     Effort: Pulmonary effort is normal. No respiratory distress.     Breath sounds: Normal breath sounds.  Abdominal:     Palpations: Abdomen is soft.     Tenderness: There is no abdominal tenderness.  Musculoskeletal:        General: No swelling.     Cervical back: Neck supple.     Comments: Patient has normal range of motion with all joints but has pain with flexion of the wrist diffusely as well.  There is no swelling noted, no redness or warmth.  Left knee is the most painful on range of motion but there is no effusion, no signs of infection.  Skin:    General: Skin is warm and dry.     Capillary Refill: Capillary refill takes less than 2 seconds.  Neurological:     General: No focal deficit present.     Mental Status: She is alert and oriented to person, place, and time.  Psychiatric:        Mood and Affect: Mood normal.     ED Results / Procedures / Treatments   Labs (all labs ordered are listed, but only abnormal results are displayed) Labs Reviewed  CBC  WITH DIFFERENTIAL/PLATELET - Abnormal; Notable for the following components:      Result Value   RBC 3.72 (*)    Hemoglobin 11.0 (*)    HCT 34.4 (*)    RDW 15.6 (*)    All other components within normal limits  BASIC METABOLIC PANEL - Abnormal; Notable for the following components:   Glucose, Bld 134 (*)    All other components within normal limits  RESP PANEL BY RT-PCR (RSV, FLU A&B, COVID)  [id number]    EKG None  Radiology DG Chest 2  View  Result Date: 12/25/2022 CLINICAL DATA:  productive cough EXAM: CHEST - 2 VIEW COMPARISON:  05/23/2022 chest radiograph. FINDINGS: Stable cardiomediastinal silhouette with normal heart size. No pneumothorax. No pleural effusion. Low lung volumes. No pulmonary edema. No acute consolidative airspace disease. IMPRESSION: No active cardiopulmonary disease. Electronically Signed   By: Delbert Phenix M.D.   On: 12/25/2022 11:27    Procedures Procedures    Medications Ordered in ED Medications  predniSONE (DELTASONE) tablet 60 mg (60 mg Oral Patient Refused/Not Given 12/25/22 1041)  ipratropium-albuterol (DUONEB) 0.5-2.5 (3) MG/3ML nebulizer solution 3 mL (3 mLs Nebulization Given 12/25/22 1122)  ketorolac (TORADOL) tablet 10 mg (10 mg Oral Given 12/25/22 1125)  HYDROcodone-acetaminophen (NORCO/VICODIN) 5-325 MG per tablet 1 tablet (1 tablet Oral Given 12/25/22 1155)    ED Course/ Medical Decision Making/ A&P Clinical Course as of 12/25/22 1242  Sun Dec 25, 2022  1046 With history of asthma and RA.  She is on albuterol and Breztri asthma.  Reports 3 days of cough and wheezing, states today she felt burning in her lungs and more short of breath.  This got much better after pills at home.  Patient stating she wants to get checked out because she gets pneumonia and bronchitis easily, states after her prior hospitalization she wants to be careful and "catch it early".  Already on doxycycline for reported tick bites.  Plan for chest x-ray, breathing treatment and  prednisone and COVID swab.  Will reevaluate.  Patient well-appearing satting 100% on room air with reassuring vitals overall. [CB]  1152 States breathing feeling better after neb treatment, states she does not take prednisone, states it causes her palpitations and swelling reports her cardiologist advised her to not take it if not necessary.  We discussed risk and benefits but she does not want to take any prednisone.  She is now complaining of severe joint pain that has been going on for 2 weeks since the tick bites.  She is almost finished with her course of doxycycline for 2 weeks but states that she still having a lot of pain, will provide her with analgesia and get some basic labs to rule any significant leukocytosis or electrolyte abnormality since she is stating she is having a "drawing sensation" in all of her limbs.  Discussed with her at this time that if labs are reassuring will likely discharge with some analgesics but have her follow-up closely with her rheumatologist as this is likely a RA flare with pain in all of her joints.  She states the pain is worse in the left knee but is not all of her joints including her fingers bilaterally.  There is no joint swelling or redness, she has pain with all range of motion but no limitations.  No left knee effusion or other joint effusions noted [CB]    Clinical Course User Index [CB] Ma Rings, PA-C                             Medical Decision Making Ddx: Pneumonia, asthma exacerbation, pulmonary edema, rheumatoid arthritis flare, Lyme disease, influenza, COVID-19, other ED course: Patient presents for cough and wheezing, initially stated this is her primary complaint she has no chest pain but states she has been having increased wheezing, relieved with her inhalers.  No wheezing here, breathing improved subjectively with a DuoNeb.  She is refusing prednisone for likely asthma flare stating her cardiologist took her not to take  it.  She is not  hypoxic or tachypneic so I feel this is reasonable to continue treating with her inhaled steroids at home. Complaining of severe arthralgias for the past couple of weeks, she is finishing a course of doxycycline for presumed tickborne illness this.  Discussed with her likely related to rheumatoid arthritis flare, she is instructed to finish her doxycycline, labs show mild anemia but otherwise reassuring.  No electrolyte abnormalities, she has no abdominal pain, no chest pain.  No fever or chills. Advised on follow-up and return precautions  Amount and/or Complexity of Data Reviewed External Data Reviewed: radiology and notes.    Details: Care pulmonology and cardiology notes, prior echo report reporting no CHF or other abnormalities Labs: ordered. Decision-making details documented in ED Course. Radiology: ordered.  Risk Prescription drug management.           Final Clinical Impression(s) / ED Diagnoses Final diagnoses:  Arthralgia, unspecified joint  Acute cough    Rx / DC Orders ED Discharge Orders          Ordered    HYDROcodone-acetaminophen (NORCO) 5-325 MG tablet  Every 6 hours PRN        12/25/22 1232              Ma Rings, PA-C 12/25/22 1242    Eber Hong, MD 12/27/22 [phone]

## 2022-12-25 NOTE — Discharge Instructions (Addendum)
It was a pleasure taking care of you today.  Your chest x-ray does not show any pneumonia or other abnormalities.  Your blood work showed mild anemia, was otherwise normal.  Will treat your joint pain with some pain medicine but is very important you follow-up closely with your rheumatologist regarding this likely RA flare.  Follow-up with your pulmonologist regarding your cough and wheezing.  Come back to the ER for new or worsening symptoms.

## 2022-12-26 DIAGNOSIS — M069 Rheumatoid arthritis, unspecified: Secondary | ICD-10-CM | POA: Diagnosis not present

## 2022-12-26 DIAGNOSIS — R52 Pain, unspecified: Secondary | ICD-10-CM | POA: Diagnosis not present

## 2022-12-28 ENCOUNTER — Telehealth: Payer: Self-pay | Admitting: Pulmonary Disease

## 2022-12-28 ENCOUNTER — Encounter (HOSPITAL_COMMUNITY): Payer: Self-pay

## 2022-12-28 ENCOUNTER — Telehealth: Payer: Self-pay | Admitting: Acute Care

## 2022-12-28 ENCOUNTER — Emergency Department (HOSPITAL_COMMUNITY): Payer: 59

## 2022-12-28 ENCOUNTER — Other Ambulatory Visit: Payer: Self-pay

## 2022-12-28 ENCOUNTER — Inpatient Hospital Stay (HOSPITAL_COMMUNITY)
Admission: EM | Admit: 2022-12-28 | Discharge: 2022-12-30 | DRG: 202 | Disposition: A | Discharge status: Home / Self Care | Payer: 59 | Attending: Family Medicine | Admitting: Family Medicine

## 2022-12-28 DIAGNOSIS — K219 Gastro-esophageal reflux disease without esophagitis: Secondary | ICD-10-CM | POA: Diagnosis present

## 2022-12-28 DIAGNOSIS — G8929 Other chronic pain: Secondary | ICD-10-CM | POA: Diagnosis not present

## 2022-12-28 DIAGNOSIS — E669 Obesity, unspecified: Secondary | ICD-10-CM | POA: Diagnosis present

## 2022-12-28 DIAGNOSIS — I10 Essential (primary) hypertension: Secondary | ICD-10-CM | POA: Diagnosis not present

## 2022-12-28 DIAGNOSIS — E876 Hypokalemia: Secondary | ICD-10-CM | POA: Diagnosis present

## 2022-12-28 DIAGNOSIS — J45901 Unspecified asthma with (acute) exacerbation: Principal | ICD-10-CM | POA: Diagnosis present

## 2022-12-28 DIAGNOSIS — J189 Pneumonia, unspecified organism: Secondary | ICD-10-CM | POA: Diagnosis not present

## 2022-12-28 DIAGNOSIS — M069 Rheumatoid arthritis, unspecified: Secondary | ICD-10-CM | POA: Diagnosis present

## 2022-12-28 DIAGNOSIS — J4521 Mild intermittent asthma with (acute) exacerbation: Secondary | ICD-10-CM | POA: Diagnosis not present

## 2022-12-28 DIAGNOSIS — Z1152 Encounter for screening for COVID-19: Secondary | ICD-10-CM | POA: Diagnosis not present

## 2022-12-28 DIAGNOSIS — D84821 Immunodeficiency due to drugs: Secondary | ICD-10-CM | POA: Diagnosis not present

## 2022-12-28 DIAGNOSIS — Z880 Allergy status to penicillin: Secondary | ICD-10-CM

## 2022-12-28 DIAGNOSIS — Z8249 Family history of ischemic heart disease and other diseases of the circulatory system: Secondary | ICD-10-CM | POA: Diagnosis not present

## 2022-12-28 DIAGNOSIS — E782 Mixed hyperlipidemia: Secondary | ICD-10-CM | POA: Diagnosis not present

## 2022-12-28 DIAGNOSIS — R0602 Shortness of breath: Secondary | ICD-10-CM | POA: Diagnosis not present

## 2022-12-28 DIAGNOSIS — Z6837 Body mass index (BMI) 37.0-37.9, adult: Secondary | ICD-10-CM

## 2022-12-28 DIAGNOSIS — J9811 Atelectasis: Secondary | ICD-10-CM | POA: Diagnosis not present

## 2022-12-28 DIAGNOSIS — F4310 Post-traumatic stress disorder, unspecified: Secondary | ICD-10-CM | POA: Diagnosis present

## 2022-12-28 DIAGNOSIS — F319 Bipolar disorder, unspecified: Secondary | ICD-10-CM | POA: Diagnosis present

## 2022-12-28 DIAGNOSIS — M797 Fibromyalgia: Secondary | ICD-10-CM | POA: Diagnosis present

## 2022-12-28 DIAGNOSIS — R6883 Chills (without fever): Secondary | ICD-10-CM | POA: Diagnosis not present

## 2022-12-28 DIAGNOSIS — Z888 Allergy status to other drugs, medicaments and biological substances status: Secondary | ICD-10-CM | POA: Diagnosis not present

## 2022-12-28 DIAGNOSIS — J4550 Severe persistent asthma, uncomplicated: Secondary | ICD-10-CM | POA: Diagnosis present

## 2022-12-28 DIAGNOSIS — Z79899 Other long term (current) drug therapy: Secondary | ICD-10-CM

## 2022-12-28 DIAGNOSIS — Z833 Family history of diabetes mellitus: Secondary | ICD-10-CM | POA: Diagnosis not present

## 2022-12-28 DIAGNOSIS — R509 Fever, unspecified: Secondary | ICD-10-CM | POA: Diagnosis not present

## 2022-12-28 DIAGNOSIS — R059 Cough, unspecified: Secondary | ICD-10-CM | POA: Diagnosis not present

## 2022-12-28 DIAGNOSIS — R0600 Dyspnea, unspecified: Secondary | ICD-10-CM | POA: Diagnosis not present

## 2022-12-28 DIAGNOSIS — Z818 Family history of other mental and behavioral disorders: Secondary | ICD-10-CM

## 2022-12-28 DIAGNOSIS — Z83438 Family history of other disorder of lipoprotein metabolism and other lipidemia: Secondary | ICD-10-CM | POA: Diagnosis not present

## 2022-12-28 DIAGNOSIS — T451X5A Adverse effect of antineoplastic and immunosuppressive drugs, initial encounter: Secondary | ICD-10-CM | POA: Diagnosis present

## 2022-12-28 DIAGNOSIS — F32A Depression, unspecified: Secondary | ICD-10-CM | POA: Diagnosis present

## 2022-12-28 DIAGNOSIS — F419 Anxiety disorder, unspecified: Secondary | ICD-10-CM | POA: Diagnosis present

## 2022-12-28 DIAGNOSIS — Z7984 Long term (current) use of oral hypoglycemic drugs: Secondary | ICD-10-CM

## 2022-12-28 LAB — CBC WITH DIFFERENTIAL/PLATELET
Abs Immature Granulocytes: 0.06 10*3/uL (ref 0.00–0.07)
Basophils Absolute: 0.1 10*3/uL (ref 0.0–0.1)
Basophils Relative: 0 %
Eosinophils Absolute: 0 10*3/uL (ref 0.0–0.5)
Eosinophils Relative: 0 %
HCT: 37.3 % (ref 36.0–46.0)
Hemoglobin: 12 g/dL (ref 12.0–15.0)
Immature Granulocytes: 0 %
Lymphocytes Relative: 13 %
Lymphs Abs: 1.7 10*3/uL (ref 0.7–4.0)
MCH: 29.6 pg (ref 26.0–34.0)
MCHC: 32.2 g/dL (ref 30.0–36.0)
MCV: 91.9 fL (ref 80.0–100.0)
Monocytes Absolute: 1.2 10*3/uL — ABNORMAL HIGH (ref 0.1–1.0)
Monocytes Relative: 9 %
Neutro Abs: 10.6 10*3/uL — ABNORMAL HIGH (ref 1.7–7.7)
Neutrophils Relative %: 78 %
Platelets: 282 10*3/uL (ref 150–400)
RBC: 4.06 MIL/uL (ref 3.87–5.11)
RDW: 16.1 % — ABNORMAL HIGH (ref 11.5–15.5)
WBC: 13.6 10*3/uL — ABNORMAL HIGH (ref 4.0–10.5)
nRBC: 0 % (ref 0.0–0.2)

## 2022-12-28 LAB — COMPREHENSIVE METABOLIC PANEL
ALT: 18 U/L (ref 0–44)
AST: 15 U/L (ref 15–41)
Albumin: 3.6 g/dL (ref 3.5–5.0)
Alkaline Phosphatase: 83 U/L (ref 38–126)
Anion gap: 9 (ref 5–15)
BUN: 19 mg/dL (ref 6–20)
CO2: 25 mmol/L (ref 22–32)
Calcium: 9.6 mg/dL (ref 8.9–10.3)
Chloride: 102 mmol/L (ref 98–111)
Creatinine, Ser: 0.78 mg/dL (ref 0.44–1.00)
GFR, Estimated: >60 mL/min (ref >60)
Glucose, Bld: 128 mg/dL — ABNORMAL HIGH (ref 70–99)
Potassium: 3.1 mmol/L — ABNORMAL LOW (ref 3.5–5.1)
Sodium: 136 mmol/L (ref 135–145)
Total Bilirubin: 0.3 mg/dL (ref 0.3–1.2)
Total Protein: 7.3 g/dL (ref 6.5–8.1)

## 2022-12-28 LAB — BRAIN NATRIURETIC PEPTIDE: B Natriuretic Peptide: 13 pg/mL (ref 0.0–100.0)

## 2022-12-28 LAB — TROPONIN I (HIGH SENSITIVITY): Troponin I (High Sensitivity): 2 ng/L (ref <18)

## 2022-12-28 MED ORDER — IOHEXOL 350 MG/ML SOLN
100.0000 mL | Freq: Once | INTRAVENOUS | Status: AC | PRN
  Administered 2022-12-28: 100 mL via INTRAVENOUS

## 2022-12-28 MED ORDER — ALBUTEROL SULFATE HFA 108 (90 BASE) MCG/ACT IN AERS: 2.0000 | INHALATION_SPRAY | RESPIRATORY_TRACT | Status: DC | PRN

## 2022-12-28 NOTE — Telephone Encounter (Signed)
Upon leaving the office it became apparent to me that my response to this call had not been made to the patient by triage. I came back into the B pod and called the patient and I have talked to her at great length regarding her symptoms.  She has clearly gotten sicker since she left the ED on the 11th. She has not been taking her prednisone because per her cardiologist she is not to take it. She is having a hard time completing sentences when she talks. She states she has been very immobile due to the fact that she has been having an RA flare. She is coughing up pink-tinged secretions I have advised her that she needs to return to the ER as she has become progressively worse since leaving the ED on 6/11.  It was difficult for me to understand exactly what she is taking currently. I am concerned that with her immobility she could potentially have a PE, she is also on methotrexate and despite being on doxycycline for 2 weeks for possible [facility]  spotted fever she states she continues to be febrile. I have advised the patient to go to the emergency room for further care I am concerned with her immunocompromised status she could be developing a pneumonia or potentially have a PE. Patient verbalized understanding that she needed to return to the ER she states she does have assistance in getting there.

## 2022-12-28 NOTE — Telephone Encounter (Signed)
Pt calling in bc she was in the hospital on 06/09, she tested neg for covid and RSV but has not been getting any better, pt still SOB, weak and has a terrible cough. Pt was give Prednisone at the hospital but it didn't help

## 2022-12-28 NOTE — Telephone Encounter (Signed)
Called and spoke with pt, pt states she is still feeling weak, has a fever, coughing up mucus states she may have seen a reddish tint, lose of appetite.  Tested negative for covid and rsv, has been sob has been occurring for 3 days. Pt has been using inhaler, neb , nasal spray, and prednisone. Pt sounds really sick on phone. ED just sent her home with prednisone. Sending to DOD please advise

## 2022-12-28 NOTE — ED Triage Notes (Signed)
Pt reports cough, fever since Monday.  Reports she was seen here and given prednisone and hydrocodone for her RA flare up but states her pulmonologist told her that the xray she had done here showed fluid on her lungs and it made her scared and she is hyperventilating.

## 2022-12-29 ENCOUNTER — Other Ambulatory Visit: Payer: Self-pay

## 2022-12-29 DIAGNOSIS — E876 Hypokalemia: Secondary | ICD-10-CM

## 2022-12-29 DIAGNOSIS — J4521 Mild intermittent asthma with (acute) exacerbation: Secondary | ICD-10-CM | POA: Diagnosis not present

## 2022-12-29 DIAGNOSIS — G8929 Other chronic pain: Secondary | ICD-10-CM | POA: Diagnosis present

## 2022-12-29 DIAGNOSIS — E669 Obesity, unspecified: Secondary | ICD-10-CM | POA: Diagnosis present

## 2022-12-29 DIAGNOSIS — F325 Major depressive disorder, single episode, in full remission: Secondary | ICD-10-CM

## 2022-12-29 DIAGNOSIS — Z6837 Body mass index (BMI) 37.0-37.9, adult: Secondary | ICD-10-CM | POA: Diagnosis not present

## 2022-12-29 DIAGNOSIS — F4310 Post-traumatic stress disorder, unspecified: Secondary | ICD-10-CM | POA: Diagnosis present

## 2022-12-29 DIAGNOSIS — R0600 Dyspnea, unspecified: Secondary | ICD-10-CM | POA: Diagnosis present

## 2022-12-29 DIAGNOSIS — Z83438 Family history of other disorder of lipoprotein metabolism and other lipidemia: Secondary | ICD-10-CM | POA: Diagnosis not present

## 2022-12-29 DIAGNOSIS — I10 Essential (primary) hypertension: Secondary | ICD-10-CM

## 2022-12-29 DIAGNOSIS — F419 Anxiety disorder, unspecified: Secondary | ICD-10-CM | POA: Diagnosis present

## 2022-12-29 DIAGNOSIS — K219 Gastro-esophageal reflux disease without esophagitis: Secondary | ICD-10-CM | POA: Diagnosis not present

## 2022-12-29 DIAGNOSIS — M797 Fibromyalgia: Secondary | ICD-10-CM | POA: Diagnosis present

## 2022-12-29 DIAGNOSIS — Z1152 Encounter for screening for COVID-19: Secondary | ICD-10-CM | POA: Diagnosis not present

## 2022-12-29 DIAGNOSIS — Z833 Family history of diabetes mellitus: Secondary | ICD-10-CM | POA: Diagnosis not present

## 2022-12-29 DIAGNOSIS — Z888 Allergy status to other drugs, medicaments and biological substances status: Secondary | ICD-10-CM | POA: Diagnosis not present

## 2022-12-29 DIAGNOSIS — Z880 Allergy status to penicillin: Secondary | ICD-10-CM | POA: Diagnosis not present

## 2022-12-29 DIAGNOSIS — M069 Rheumatoid arthritis, unspecified: Secondary | ICD-10-CM

## 2022-12-29 DIAGNOSIS — D84821 Immunodeficiency due to drugs: Secondary | ICD-10-CM | POA: Diagnosis present

## 2022-12-29 DIAGNOSIS — J189 Pneumonia, unspecified organism: Secondary | ICD-10-CM | POA: Diagnosis present

## 2022-12-29 DIAGNOSIS — E782 Mixed hyperlipidemia: Secondary | ICD-10-CM | POA: Diagnosis not present

## 2022-12-29 DIAGNOSIS — Z79899 Other long term (current) drug therapy: Secondary | ICD-10-CM | POA: Diagnosis not present

## 2022-12-29 DIAGNOSIS — T451X5A Adverse effect of antineoplastic and immunosuppressive drugs, initial encounter: Secondary | ICD-10-CM | POA: Diagnosis present

## 2022-12-29 DIAGNOSIS — J4541 Moderate persistent asthma with (acute) exacerbation: Secondary | ICD-10-CM | POA: Diagnosis not present

## 2022-12-29 DIAGNOSIS — R509 Fever, unspecified: Secondary | ICD-10-CM | POA: Diagnosis not present

## 2022-12-29 DIAGNOSIS — Z818 Family history of other mental and behavioral disorders: Secondary | ICD-10-CM | POA: Diagnosis not present

## 2022-12-29 DIAGNOSIS — J45901 Unspecified asthma with (acute) exacerbation: Secondary | ICD-10-CM | POA: Diagnosis present

## 2022-12-29 DIAGNOSIS — Z8249 Family history of ischemic heart disease and other diseases of the circulatory system: Secondary | ICD-10-CM | POA: Diagnosis not present

## 2022-12-29 DIAGNOSIS — R059 Cough, unspecified: Secondary | ICD-10-CM | POA: Diagnosis not present

## 2022-12-29 DIAGNOSIS — F319 Bipolar disorder, unspecified: Secondary | ICD-10-CM | POA: Diagnosis present

## 2022-12-29 LAB — CBC WITH DIFFERENTIAL/PLATELET
Abs Immature Granulocytes: 0.06 10*3/uL (ref 0.00–0.07)
Basophils Absolute: 0.1 10*3/uL (ref 0.0–0.1)
Basophils Relative: 1 %
Eosinophils Absolute: 0 10*3/uL (ref 0.0–0.5)
Eosinophils Relative: 0 %
HCT: 33 % — ABNORMAL LOW (ref 36.0–46.0)
Hemoglobin: 10.6 g/dL — ABNORMAL LOW (ref 12.0–15.0)
Immature Granulocytes: 1 %
Lymphocytes Relative: 20 %
Lymphs Abs: 1.7 10*3/uL (ref 0.7–4.0)
MCH: 29.2 pg (ref 26.0–34.0)
MCHC: 32.1 g/dL (ref 30.0–36.0)
MCV: 90.9 fL (ref 80.0–100.0)
Monocytes Absolute: 1.3 10*3/uL — ABNORMAL HIGH (ref 0.1–1.0)
Monocytes Relative: 15 %
Neutro Abs: 5.7 10*3/uL (ref 1.7–7.7)
Neutrophils Relative %: 63 %
Platelets: 244 10*3/uL (ref 150–400)
RBC: 3.63 MIL/uL — ABNORMAL LOW (ref 3.87–5.11)
RDW: 16.3 % — ABNORMAL HIGH (ref 11.5–15.5)
WBC: 8.9 10*3/uL (ref 4.0–10.5)
nRBC: 0 % (ref 0.0–0.2)

## 2022-12-29 LAB — RESP PANEL BY RT-PCR (RSV, FLU A&B, COVID)  RVPGX2
Influenza A by PCR: NEGATIVE
Influenza B by PCR: NEGATIVE
Resp Syncytial Virus by PCR: NEGATIVE
SARS Coronavirus 2 by RT PCR: NEGATIVE

## 2022-12-29 LAB — COMPREHENSIVE METABOLIC PANEL
ALT: 16 U/L (ref 0–44)
AST: 14 U/L — ABNORMAL LOW (ref 15–41)
Albumin: 3.1 g/dL — ABNORMAL LOW (ref 3.5–5.0)
Alkaline Phosphatase: 77 U/L (ref 38–126)
Anion gap: 6 (ref 5–15)
BUN: 18 mg/dL (ref 6–20)
CO2: 24 mmol/L (ref 22–32)
Calcium: 8.6 mg/dL — ABNORMAL LOW (ref 8.9–10.3)
Chloride: 104 mmol/L (ref 98–111)
Creatinine, Ser: 0.88 mg/dL (ref 0.44–1.00)
GFR, Estimated: >60 mL/min (ref >60)
Glucose, Bld: 169 mg/dL — ABNORMAL HIGH (ref 70–99)
Potassium: 3.4 mmol/L — ABNORMAL LOW (ref 3.5–5.1)
Sodium: 134 mmol/L — ABNORMAL LOW (ref 135–145)
Total Bilirubin: 0.2 mg/dL — ABNORMAL LOW (ref 0.3–1.2)
Total Protein: 6.5 g/dL (ref 6.5–8.1)

## 2022-12-29 LAB — TROPONIN I (HIGH SENSITIVITY): Troponin I (High Sensitivity): 2 ng/L (ref <18)

## 2022-12-29 LAB — MAGNESIUM: Magnesium: 1.7 mg/dL (ref 1.7–2.4)

## 2022-12-29 MED ORDER — ATORVASTATIN CALCIUM 40 MG PO TABS
40.0000 mg | ORAL_TABLET | Freq: Every day | ORAL | Status: DC
  Administered 2022-12-29 – 2022-12-30 (×2): 40 mg via ORAL
  Filled 2022-12-29 (×2): qty 1

## 2022-12-29 MED ORDER — ALBUTEROL SULFATE (2.5 MG/3ML) 0.083% IN NEBU: 2.5000 mg | INHALATION_SOLUTION | RESPIRATORY_TRACT | Status: DC | PRN

## 2022-12-29 MED ORDER — HEPARIN SODIUM (PORCINE) 5000 UNIT/ML IJ SOLN
5000.0000 [IU] | Freq: Three times a day (TID) | INTRAMUSCULAR | Status: DC
  Administered 2022-12-29 – 2022-12-30 (×3): 5000 [IU] via SUBCUTANEOUS
  Filled 2022-12-29 (×3): qty 1

## 2022-12-29 MED ORDER — LORAZEPAM 2 MG/ML IJ SOLN
1.0000 mg | Freq: Once | INTRAMUSCULAR | Status: AC
  Administered 2022-12-29: 1 mg via INTRAVENOUS
  Filled 2022-12-29: qty 1

## 2022-12-29 MED ORDER — ACETAMINOPHEN 650 MG RE SUPP: 650.0000 mg | Freq: Four times a day (QID) | RECTAL | Status: DC | PRN

## 2022-12-29 MED ORDER — METHYLPREDNISOLONE SODIUM SUCC 125 MG IJ SOLR
125.0000 mg | Freq: Two times a day (BID) | INTRAMUSCULAR | Status: DC
  Filled 2022-12-29: qty 2

## 2022-12-29 MED ORDER — PREGABALIN 75 MG PO CAPS
75.0000 mg | ORAL_CAPSULE | Freq: Every day | ORAL | Status: DC
  Administered 2022-12-29 – 2022-12-30 (×2): 75 mg via ORAL
  Filled 2022-12-29 (×2): qty 1

## 2022-12-29 MED ORDER — ONDANSETRON HCL 4 MG PO TABS: 4.0000 mg | ORAL_TABLET | Freq: Four times a day (QID) | ORAL | Status: DC | PRN

## 2022-12-29 MED ORDER — SODIUM CHLORIDE 0.9 % IV SOLN
1.0000 g | INTRAVENOUS | Status: DC
  Administered 2022-12-29: 1 g via INTRAVENOUS
  Filled 2022-12-29: qty 10

## 2022-12-29 MED ORDER — POTASSIUM CHLORIDE 20 MEQ PO PACK
40.0000 meq | PACK | Freq: Once | ORAL | Status: AC
  Administered 2022-12-29: 40 meq via ORAL
  Filled 2022-12-29: qty 2

## 2022-12-29 MED ORDER — PROCHLORPERAZINE EDISYLATE 10 MG/2ML IJ SOLN
10.0000 mg | Freq: Once | INTRAMUSCULAR | Status: AC
  Administered 2022-12-29: 10 mg via INTRAVENOUS
  Filled 2022-12-29: qty 2

## 2022-12-29 MED ORDER — PRAZOSIN HCL 5 MG PO CAPS
5.0000 mg | ORAL_CAPSULE | Freq: Two times a day (BID) | ORAL | Status: DC
  Administered 2022-12-29 (×2): 5 mg via ORAL
  Filled 2022-12-29 (×9): qty 1

## 2022-12-29 MED ORDER — IPRATROPIUM-ALBUTEROL 0.5-2.5 (3) MG/3ML IN SOLN
3.0000 mL | RESPIRATORY_TRACT | Status: DC
  Administered 2022-12-29 (×6): 3 mL via RESPIRATORY_TRACT
  Filled 2022-12-29 (×6): qty 3

## 2022-12-29 MED ORDER — MAGNESIUM SULFATE 2 GM/50ML IV SOLN
2.0000 g | Freq: Once | INTRAVENOUS | Status: AC
  Administered 2022-12-29: 2 g via INTRAVENOUS
  Filled 2022-12-29: qty 50

## 2022-12-29 MED ORDER — HYDROXYZINE HCL 25 MG PO TABS: 25.0000 mg | ORAL_TABLET | Freq: Two times a day (BID) | ORAL | Status: DC | PRN

## 2022-12-29 MED ORDER — PNEUMOCOCCAL 20-VAL CONJ VACC 0.5 ML IM SUSY: 0.5000 mL | PREFILLED_SYRINGE | INTRAMUSCULAR | Status: DC | PRN

## 2022-12-29 MED ORDER — MOMETASONE FURO-FORMOTEROL FUM 100-5 MCG/ACT IN AERO
2.0000 | INHALATION_SPRAY | Freq: Two times a day (BID) | RESPIRATORY_TRACT | Status: DC
  Administered 2022-12-29 – 2022-12-30 (×3): 2 via RESPIRATORY_TRACT
  Filled 2022-12-29: qty 8.8

## 2022-12-29 MED ORDER — UMECLIDINIUM BROMIDE 62.5 MCG/ACT IN AEPB
1.0000 | INHALATION_SPRAY | Freq: Every day | RESPIRATORY_TRACT | Status: DC
  Administered 2022-12-29 – 2022-12-30 (×2): 1 via RESPIRATORY_TRACT
  Filled 2022-12-29: qty 7

## 2022-12-29 MED ORDER — MIRABEGRON ER 25 MG PO TB24
25.0000 mg | ORAL_TABLET | Freq: Every day | ORAL | Status: DC
  Filled 2022-12-29: qty 1

## 2022-12-29 MED ORDER — MORPHINE SULFATE (PF) 2 MG/ML IV SOLN: 2.0000 mg | INTRAVENOUS | Status: DC | PRN

## 2022-12-29 MED ORDER — SODIUM CHLORIDE 0.9 % IV BOLUS
500.0000 mL | Freq: Once | INTRAVENOUS | Status: AC
  Administered 2022-12-29: 500 mL via INTRAVENOUS

## 2022-12-29 MED ORDER — BUDESON-GLYCOPYRROL-FORMOTEROL 160-9-4.8 MCG/ACT IN AERO: 2.0000 | INHALATION_SPRAY | Freq: Two times a day (BID) | RESPIRATORY_TRACT | Status: DC

## 2022-12-29 MED ORDER — SODIUM CHLORIDE 0.9 % IV SOLN
2.0000 g | Freq: Once | INTRAVENOUS | Status: AC
  Administered 2022-12-29: 2 g via INTRAVENOUS
  Filled 2022-12-29: qty 20

## 2022-12-29 MED ORDER — HYDROCODONE-ACETAMINOPHEN 5-325 MG PO TABS
1.0000 | ORAL_TABLET | Freq: Four times a day (QID) | ORAL | Status: DC | PRN
  Administered 2022-12-29 (×2): 1 via ORAL
  Filled 2022-12-29 (×2): qty 1

## 2022-12-29 MED ORDER — DIPHENHYDRAMINE HCL 50 MG/ML IJ SOLN
25.0000 mg | Freq: Once | INTRAMUSCULAR | Status: AC
  Administered 2022-12-29: 25 mg via INTRAVENOUS
  Filled 2022-12-29: qty 1

## 2022-12-29 MED ORDER — PANTOPRAZOLE SODIUM 40 MG PO TBEC
40.0000 mg | DELAYED_RELEASE_TABLET | Freq: Every day | ORAL | Status: DC
  Administered 2022-12-29 – 2022-12-30 (×2): 40 mg via ORAL
  Filled 2022-12-29 (×2): qty 1

## 2022-12-29 MED ORDER — SODIUM CHLORIDE 0.9 % IV SOLN
500.0000 mg | INTRAVENOUS | Status: DC
  Administered 2022-12-29 – 2022-12-30 (×2): 500 mg via INTRAVENOUS
  Filled 2022-12-29 (×2): qty 5

## 2022-12-29 MED ORDER — ALPRAZOLAM 0.5 MG PO TABS
0.5000 mg | ORAL_TABLET | Freq: Two times a day (BID) | ORAL | Status: DC | PRN
  Administered 2022-12-29: 0.5 mg via ORAL
  Filled 2022-12-29: qty 1

## 2022-12-29 MED ORDER — ONDANSETRON HCL 4 MG/2ML IJ SOLN
4.0000 mg | Freq: Four times a day (QID) | INTRAMUSCULAR | Status: DC | PRN
  Administered 2022-12-30: 4 mg via INTRAVENOUS
  Filled 2022-12-29: qty 2

## 2022-12-29 MED ORDER — OXCARBAZEPINE 300 MG PO TABS
600.0000 mg | ORAL_TABLET | Freq: Every day | ORAL | Status: DC
  Administered 2022-12-29: 600 mg via ORAL
  Filled 2022-12-29: qty 2

## 2022-12-29 MED ORDER — DULOXETINE HCL 60 MG PO CPEP
60.0000 mg | ORAL_CAPSULE | Freq: Every day | ORAL | Status: DC
  Administered 2022-12-29 – 2022-12-30 (×2): 60 mg via ORAL
  Filled 2022-12-29 (×2): qty 1

## 2022-12-29 MED ORDER — ACETAMINOPHEN 325 MG PO TABS: 650.0000 mg | ORAL_TABLET | Freq: Four times a day (QID) | ORAL | Status: DC | PRN

## 2022-12-29 MED ORDER — MIRABEGRON ER 25 MG PO TB24
25.0000 mg | ORAL_TABLET | Freq: Every day | ORAL | Status: DC
  Administered 2022-12-29: 25 mg via ORAL
  Filled 2022-12-29: qty 1

## 2022-12-29 MED ORDER — METHYLPREDNISOLONE SODIUM SUCC 40 MG IJ SOLR
40.0000 mg | Freq: Two times a day (BID) | INTRAMUSCULAR | Status: DC
  Administered 2022-12-29 – 2022-12-30 (×3): 40 mg via INTRAVENOUS
  Filled 2022-12-29 (×3): qty 1

## 2022-12-29 NOTE — Assessment & Plan Note (Signed)
Continue atorvastatin

## 2022-12-29 NOTE — Assessment & Plan Note (Signed)
-   Secondary to pneumonia - Continue scheduled DuoNeb, as needed albuterol - Continue Dulera - Start steroid treatment - Treat underlying pneumonia

## 2022-12-29 NOTE — Assessment & Plan Note (Signed)
-   Depression with anxiety - Continue Cymbalta and Atarax - Continue to monitor

## 2022-12-29 NOTE — Telephone Encounter (Signed)
Thanks [name] for taking care of this nothing further needed at this time

## 2022-12-29 NOTE — Assessment & Plan Note (Signed)
-   Last methotrexate was 1 week prior to arrival - Holding methotrexate

## 2022-12-29 NOTE — ED Notes (Signed)
ED TO INPATIENT HANDOFF REPORT  ED Nurse Name and Phone #: Wandra Mannan, Paramedic [phone]  S Name/Age/Gender [name] 52 y.o. female Room/Bed: APOTF/OTF  Code Status   Code Status: Full Code  Home/SNF/Other Home Patient oriented to: self, place, time, and situation Is this baseline? Yes   Triage Complete: Triage complete  Chief Complaint CAP (community acquired pneumonia) [J18.9]  Triage Note Pt reports cough, fever since Monday.  Reports she was seen here and given prednisone and hydrocodone for her RA flare up but states her pulmonologist told her that the xray she had done here showed fluid on her lungs and it made her scared and she is hyperventilating.   Allergies Allergies  Allergen Reactions   Peanut-Containing Drug Products Anaphylaxis    Swelling    Tegretol [Carbamazepine] Shortness Of Breath and Swelling   Lithium Other (See Comments)    Bad acne    Sweet Potato Itching   Penicillins Rash    Level of Care/Admitting Diagnosis ED Disposition     ED Disposition  Admit   Condition  --   Comment  Hospital Area: [facility] [[id number]]  Level of Care: Med-Surg [16]  Covid Evaluation: Asymptomatic - no recent exposure (last 10 days) testing not required  Diagnosis: CAP (community acquired pneumonia) [454098]  Admitting Physician: Lilyan Gilford [[id number]]  Attending Physician: Lilyan Gilford [[id number]]          B Medical/Surgery History Past Medical History:  Diagnosis Date   Anemia    Anxiety    Arthritis    orthopedic   Bipolar disorder (HCC)    Depression    Fibromyalgia    GERD (gastroesophageal reflux disease)    Hypotension    IBS (irritable bowel syndrome)    Migraine    PTSD (post-traumatic stress disorder)    on prazosin   Sleep apnea    Tonsillar hypertrophy    Uterine fibroid    Past Surgical History:  Procedure Laterality Date   ENDOMETRIAL ABLATION     TONSILLECTOMY AND ADENOIDECTOMY  N/A 02/13/2018   Procedure: TONSILLECTOMY AND ADENOIDECTOMY;  Surgeon: Newman Pies, MD;  Location: [facility];  Service: ENT;  Laterality: N/A;   TUBAL LIGATION       A IV Location/Drains/Wounds Patient Lines/Drains/Airways Status     Active Line/Drains/Airways     Name Placement date Placement time Site Days   Peripheral IV 12/28/22 20 G Anterior;Proximal;Right Forearm 12/28/22  2326  Forearm  1            Intake/Output Last 24 hours  Intake/Output Summary (Last 24 hours) at 12/29/2022 0810 Last data filed at 12/29/2022 0207 Gross per 24 hour  Intake 600 ml  Output --  Net 600 ml    Labs/Imaging Results for orders placed or performed during the hospital encounter of 12/28/22 (from the past 48 hour(s))  CBC with Differential     Status: Abnormal   Collection Time: 12/28/22  8:19 PM  Result Value Ref Range   WBC 13.6 (H) 4.0 - 10.5 K/uL   RBC 4.06 3.87 - 5.11 MIL/uL   Hemoglobin 12.0 12.0 - 15.0 g/dL   HCT 08.6 57.8 - 46.9 %   MCV 91.9 80.0 - 100.0 fL   MCH 29.6 26.0 - 34.0 pg   MCHC 32.2 30.0 - 36.0 g/dL   RDW 62.9 (H) 52.8 - 41.3 %   Platelets 282 150 - 400 K/uL   nRBC 0.0 0.0 - 0.2 %  Neutrophils Relative % 78 %   Neutro Abs 10.6 (H) 1.7 - 7.7 K/uL   Lymphocytes Relative 13 %   Lymphs Abs 1.7 0.7 - 4.0 K/uL   Monocytes Relative 9 %   Monocytes Absolute 1.2 (H) 0.1 - 1.0 K/uL   Eosinophils Relative 0 %   Eosinophils Absolute 0.0 0.0 - 0.5 K/uL   Basophils Relative 0 %   Basophils Absolute 0.1 0.0 - 0.1 K/uL   Immature Granulocytes 0 %   Abs Immature Granulocytes 0.06 0.00 - 0.07 K/uL    Comment: Performed at [facility], [address] [place], [place] [address]  Comprehensive metabolic panel     Status: Abnormal   Collection Time: 12/28/22  8:19 PM  Result Value Ref Range   Sodium 136 135 - 145 mmol/L   Potassium 3.1 (L) 3.5 - 5.1 mmol/L   Chloride 102 98 - 111 mmol/L   CO2 25 22 - 32 mmol/L   Glucose, Bld 128 (H) 70 - 99 mg/dL     Comment: Glucose reference range applies only to samples taken after fasting for at least 8 hours.   BUN 19 6 - 20 mg/dL   Creatinine, Ser 1.30 0.44 - 1.00 mg/dL   Calcium 9.6 8.9 - 86.5 mg/dL   Total Protein 7.3 6.5 - 8.1 g/dL   Albumin 3.6 3.5 - 5.0 g/dL   AST 15 15 - 41 U/L   ALT 18 0 - 44 U/L   Alkaline Phosphatase 83 38 - 126 U/L   Total Bilirubin 0.3 0.3 - 1.2 mg/dL   GFR, Estimated >78 >46 mL/min    Comment: (NOTE) Calculated using the CKD-EPI Creatinine Equation (2021)    Anion gap 9 5 - 15    Comment: Performed at [facility], [address], [place], [place] [address]  Troponin I (High Sensitivity)     Status: None   Collection Time: 12/28/22  8:19 PM  Result Value Ref Range   Troponin I (High Sensitivity) 2 <18 ng/L    Comment: (NOTE) Elevated high sensitivity troponin I (hsTnI) values and significant  changes across serial measurements may suggest ACS but many other  chronic and acute conditions are known to elevate hsTnI results.  Refer to the "Links" section for chest pain algorithms and additional  guidance. Performed at [facility], [address], [place], [place] [address]   Brain natriuretic peptide     Status: None   Collection Time: 12/28/22  8:19 PM  Result Value Ref Range   B Natriuretic Peptide 13.0 0.0 - 100.0 pg/mL    Comment: Performed at [facility], [address], [place], [place] [address]  Troponin I (High Sensitivity)     Status: None   Collection Time: 12/29/22  1:11 AM  Result Value Ref Range   Troponin I (High Sensitivity) 2 <18 ng/L    Comment: (NOTE) Elevated high sensitivity troponin I (hsTnI) values and significant  changes across serial measurements may suggest ACS but many other  chronic and acute conditions are known to elevate hsTnI results.  Refer to the "Links" section for chest pain algorithms and additional  guidance. Performed at [facility], [address], [place], [place] [address]   Comprehensive metabolic panel      Status: Abnormal   Collection Time: 12/29/22  5:54 AM  Result Value Ref Range   Sodium 134 (L) 135 - 145 mmol/L   Potassium 3.4 (L) 3.5 - 5.1 mmol/L   Chloride 104 98 - 111 mmol/L  CO2 24 22 - 32 mmol/L   Glucose, Bld 169 (H) 70 - 99 mg/dL    Comment: Glucose reference range applies only to samples taken after fasting for at least 8 hours.   BUN 18 6 - 20 mg/dL   Creatinine, Ser 2.95 0.44 - 1.00 mg/dL   Calcium 8.6 (L) 8.9 - 10.3 mg/dL   Total Protein 6.5 6.5 - 8.1 g/dL   Albumin 3.1 (L) 3.5 - 5.0 g/dL   AST 14 (L) 15 - 41 U/L   ALT 16 0 - 44 U/L   Alkaline Phosphatase 77 38 - 126 U/L   Total Bilirubin 0.2 (L) 0.3 - 1.2 mg/dL   GFR, Estimated >62 >13 mL/min    Comment: (NOTE) Calculated using the CKD-EPI Creatinine Equation (2021)    Anion gap 6 5 - 15    Comment: Performed at [facility], [address], [place], [place] [address]  Magnesium     Status: None   Collection Time: 12/29/22  5:54 AM  Result Value Ref Range   Magnesium 1.7 1.7 - 2.4 mg/dL    Comment: Performed at [facility], [address] [place], [place] [address]  CBC with Differential/Platelet     Status: Abnormal   Collection Time: 12/29/22  5:54 AM  Result Value Ref Range   WBC 8.9 4.0 - 10.5 K/uL   RBC 3.63 (L) 3.87 - 5.11 MIL/uL   Hemoglobin 10.6 (L) 12.0 - 15.0 g/dL   HCT 29.5 (L) 28.4 - 13.2 %   MCV 90.9 80.0 - 100.0 fL   MCH 29.2 26.0 - 34.0 pg   MCHC 32.1 30.0 - 36.0 g/dL   RDW 44.0 (H) 10.2 - 72.5 %   Platelets 244 150 - 400 K/uL   nRBC 0.0 0.0 - 0.2 %   Neutrophils Relative % 63 %   Neutro Abs 5.7 1.7 - 7.7 K/uL   Lymphocytes Relative 20 %   Lymphs Abs 1.7 0.7 - 4.0 K/uL   Monocytes Relative 15 %   Monocytes Absolute 1.3 (H) 0.1 - 1.0 K/uL   Eosinophils Relative 0 %   Eosinophils Absolute 0.0 0.0 - 0.5 K/uL   Basophils Relative 1 %   Basophils Absolute 0.1 0.0 - 0.1 K/uL   Immature Granulocytes 1 %   Abs Immature Granulocytes 0.06 0.00 - 0.07 K/uL    Comment: Performed at [facility], [address], [place], [place] [address]   CT Angio Chest Pulmonary Embolism (PE) W or WO Contrast  Result Date: 12/29/2022 CLINICAL DATA:  Pulmonary embolism (PE) suspected, high prob vs pna vs edema. Cough, fever EXAM: CT ANGIOGRAPHY CHEST WITH CONTRAST TECHNIQUE: Multidetector CT imaging of the chest was performed using the standard protocol during bolus administration of intravenous contrast. Multiplanar CT image reconstructions and MIPs were obtained to evaluate the vascular anatomy. RADIATION DOSE REDUCTION: This exam was performed according to the departmental dose-optimization program which includes automated exposure control, adjustment of the mA and/or kV according to patient size and/or use of iterative reconstruction technique. CONTRAST:  OMNIPAQUE IOHEXOL 350 MG/ML SOLN COMPARISON:  05/23/2022 FINDINGS: Cardiovascular: No filling defects in the pulmonary arteries to suggest pulmonary emboli. Heart is normal size. Aorta is normal caliber. Mediastinum/Nodes: Moderate sized hiatal hernia, stable. Borderline sized bilateral hilar and mediastinal lymph nodes. No axillary adenopathy. Trachea and esophagus are unremarkable. 1 cm low-density lesion in the right thyroid lobe, smaller since prior study. Not clinically significant; no follow-up imaging recommended (ref: J Am Coll Radiol.  2015 Feb;12(2): 143-50). Lungs/Pleura: Peribronchial thickening. Nodular ground-glass opacities throughout the right lung, most pronounced in the right lower lobe and posterior right upper lobe. Few scattered ground-glass nodular areas within the left lung. Findings most likely infectious/inflammatory. No effusions. Upper Abdomen: No acute findings Musculoskeletal: Chest wall soft tissues are unremarkable. No acute bony abnormality. Review of the MIP images confirms the above findings. IMPRESSION: No evidence of pulmonary embolus. Ground-glass nodular opacities throughout the right lung and scattered less pronounced  throughout the left lung. Findings most likely infectious/inflammatory. Borderline sized mediastinal and bilateral hilar lymph nodes, likely reactive. Moderate-sized hiatal hernia. Electronically Signed   By: Charlett Nose M.D.   On: 12/29/2022 00:19   DG Chest 2 View  Result Date: 12/28/2022 CLINICAL DATA:  Cough and chills x3 days. EXAM: CHEST - 2 VIEW COMPARISON:  December 25, 2022 FINDINGS: The heart size and mediastinal contours are within normal limits. Mild, diffusely increased lung markings are seen. Mild atelectatic changes are noted within the bilateral lung bases. No pleural effusion or pneumothorax is identified. The visualized skeletal structures are unremarkable. IMPRESSION: Mild diffusely increased lung markings with mild bibasilar atelectasis. Electronically Signed   By: Aram Candela M.D.   On: 12/28/2022 19:10    Pending Labs Unresulted Labs (From admission, onward)     Start     Ordered   12/30/22 0500  Basic metabolic panel  Tomorrow morning,   R        12/29/22 0805   12/30/22 0500  CBC  Tomorrow morning,   R        12/29/22 0805   12/29/22 0419  Resp panel by RT-PCR (RSV, Flu A&B, Covid) Anterior Nasal Swab  Once,   R        12/29/22 0418   12/29/22 0351  Expectorated Sputum Assessment w Gram Stain, Rflx to Resp Cult  (COPD / Pneumonia / Cellulitis / Lower Extremity Wound)  Once,   R        12/29/22 0350   12/29/22 0351  Legionella Pneumophila Serogp 1 Ur Ag  (COPD / Pneumonia / Cellulitis / Lower Extremity Wound)  Once,   R        12/29/22 0350   12/29/22 0351  Strep pneumoniae urinary antigen  (COPD / Pneumonia / Cellulitis / Lower Extremity Wound)  Once,   R        12/29/22 0350            Vitals/Pain Today's Vitals   12/29/22 0400 12/29/22 0408 12/29/22 0453 12/29/22 0600  BP: 119/78   114/63  Pulse: (!) 104   90  Resp: (!) 29   (!) 24  Temp: 98.7 F (37.1 C)     TempSrc:      SpO2: 97% 98%  96%  Weight:      Height:      PainSc:   4      Isolation  Precautions No active isolations  Medications Medications  albuterol (VENTOLIN HFA) 108 (90 Base) MCG/ACT inhaler 2 puff (has no administration in time range)  azithromycin (ZITHROMAX) 500 mg in sodium chloride 0.9 % 250 mL IVPB (500 mg Intravenous New Bag/Given 12/29/22 0102)  cefTRIAXone (ROCEPHIN) 1 g in sodium chloride 0.9 % 100 mL IVPB (has no administration in time range)  HYDROcodone-acetaminophen (NORCO/VICODIN) 5-325 MG per tablet 1 tablet (1 tablet Oral Given 12/29/22 0422)  atorvastatin (LIPITOR) tablet 40 mg (has no administration in time range)  prazosin (MINIPRESS) capsule 5 mg (has no  administration in time range)  ALPRAZolam (XANAX) tablet 0.5 mg (has no administration in time range)  DULoxetine (CYMBALTA) DR capsule 60 mg (has no administration in time range)  hydrOXYzine (ATARAX) tablet 25 mg (has no administration in time range)  pantoprazole (PROTONIX) EC tablet 40 mg (has no administration in time range)  mirabegron ER (MYRBETRIQ) tablet 25 mg (has no administration in time range)  Oxcarbazepine (TRILEPTAL) tablet 600 mg (has no administration in time range)  pregabalin (LYRICA) capsule 75 mg (has no administration in time range)  heparin injection 5,000 Units (has no administration in time range)  acetaminophen (TYLENOL) tablet 650 mg (has no administration in time range)    Or  acetaminophen (TYLENOL) suppository 650 mg (has no administration in time range)  morphine (PF) 2 MG/ML injection 2 mg (has no administration in time range)  ondansetron (ZOFRAN) tablet 4 mg (has no administration in time range)    Or  ondansetron (ZOFRAN) injection 4 mg (has no administration in time range)  ipratropium-albuterol (DUONEB) 0.5-2.5 (3) MG/3ML nebulizer solution 3 mL (3 mLs Nebulization Given 12/29/22 0408)  albuterol (PROVENTIL) (2.5 MG/3ML) 0.083% nebulizer solution 2.5 mg (has no administration in time range)  methylPREDNISolone sodium succinate (SOLU-MEDROL) 125 mg/2 mL  injection 125 mg (has no administration in time range)  mometasone-formoterol (DULERA) 100-5 MCG/ACT inhaler 2 puff (has no administration in time range)  umeclidinium bromide (INCRUSE ELLIPTA) 62.5 MCG/ACT 1 puff (has no administration in time range)  magnesium sulfate IVPB 2 g 50 mL (has no administration in time range)  iohexol (OMNIPAQUE) 350 MG/ML injection 100 mL (100 mLs Intravenous Contrast Given 12/28/22 2359)  cefTRIAXone (ROCEPHIN) 2 g in sodium chloride 0.9 % 100 mL IVPB (0 g Intravenous Stopped 12/29/22 0207)  sodium chloride 0.9 % bolus 500 mL (0 mLs Intravenous Stopped 12/29/22 0207)  prochlorperazine (COMPAZINE) injection 10 mg (10 mg Intravenous Given 12/29/22 0050)  diphenhydrAMINE (BENADRYL) injection 25 mg (25 mg Intravenous Given 12/29/22 0050)  LORazepam (ATIVAN) injection 1 mg (1 mg Intravenous Given 12/29/22 0101)  potassium chloride (KLOR-CON) packet 40 mEq (40 mEq Oral Given 12/29/22 0207)    Mobility walks     Focused Assessments Pulmonary Assessment Handoff:  Lung sounds: Bilateral Breath Sounds: Expiratory wheezes O2 Device: Room Air      R Recommendations: See Admitting Provider Note  Report given to:   Additional Notes: N/A

## 2022-12-29 NOTE — Progress Notes (Signed)
   12/29/22 1237  Assess: MEWS Score  Temp 97.9 F (36.6 C)  BP 100/60  MAP (mmHg) 72  Pulse Rate (!) 115  Resp 20  SpO2 97 %  O2 Device Room Air  Assess: MEWS Score  MEWS Temp 0  MEWS Systolic 1  MEWS Pulse 2  MEWS RR 0  MEWS LOC 0  MEWS Score 3  MEWS Score Color Yellow  Assess: if the MEWS score is Yellow or Red  Were vital signs taken at a resting state? Yes  Focused Assessment No change from prior assessment  Does the patient meet 2 or more of the SIRS criteria? No  MEWS guidelines implemented  Yes, yellow  Treat  MEWS Interventions Considered administering scheduled or prn medications/treatments as ordered  Take Vital Signs  Increase Vital Sign Frequency  Yellow: Q2hr x1, continue Q4hrs until patient remains green for 12hrs  Escalate  MEWS: Escalate Yellow: Discuss with charge nurse and consider notifying provider and/or RRT  Notify: Charge Nurse/RN  Name of Charge Nurse/RN Notified Chales Abrahams, RN  Assess: SIRS CRITERIA  SIRS Temperature  0  SIRS Pulse 1  SIRS Respirations  0  SIRS WBC 0  SIRS Score Sum  1

## 2022-12-29 NOTE — Progress Notes (Signed)
   12/29/22 1539  Assess: MEWS Score  BP (!) 155/94  MAP (mmHg) 110  Pulse Rate (!) 103  Resp (!) 22  SpO2 98 %  O2 Device Room Air  Assess: MEWS Score  MEWS Temp 0  MEWS Systolic 0  MEWS Pulse 1  MEWS RR 1  MEWS LOC 0  MEWS Score 2  MEWS Score Color Yellow  Assess: if the MEWS score is Yellow or Red  Were vital signs taken at a resting state? Yes  Focused Assessment No change from prior assessment  Does the patient meet 2 or more of the SIRS criteria? No  MEWS guidelines implemented  No, previously yellow, continue vital signs every 4 hours  Assess: SIRS CRITERIA  SIRS Temperature  0  SIRS Pulse 1  SIRS Respirations  1  SIRS WBC 0  SIRS Score Sum  2

## 2022-12-29 NOTE — ED Provider Notes (Signed)
AP-EMERGENCY DEPT [facility] Emergency Department Provider Note MRN:  [id number]  Arrival date & time: 12/29/22     Chief Complaint   Cough   History of Present Illness   [name] is a 52 y.o. year-old female with a history of rheumatoid arthritis presenting to the ED with chief complaint of cough.  Cough, fever, body aches, shortness of breath, occasional chest pain for the past several days.  Recent tick bites, has been on doxycycline for possible tickborne illness.  Review of Systems  A thorough review of systems was obtained and all systems are negative except as noted in the HPI and PMH.   Patient's Health History    Past Medical History:  Diagnosis Date   Anemia    Anxiety    Arthritis    orthopedic   Bipolar disorder (HCC)    Depression    Fibromyalgia    GERD (gastroesophageal reflux disease)    Hypotension    IBS (irritable bowel syndrome)    Migraine    PTSD (post-traumatic stress disorder)    on prazosin   Sleep apnea    Tonsillar hypertrophy    Uterine fibroid     Past Surgical History:  Procedure Laterality Date   ENDOMETRIAL ABLATION     TONSILLECTOMY AND ADENOIDECTOMY N/A 02/13/2018   Procedure: TONSILLECTOMY AND ADENOIDECTOMY;  Surgeon: Newman Pies, MD;  Location: [facility];  Service: ENT;  Laterality: N/A;   TUBAL LIGATION      Family History  Problem Relation Age of Onset   Depression Mother    Hypertension Mother    Hyperlipidemia Mother    Other Mother        bone degeneration   Depression Brother    Alcohol abuse Brother    Alcohol abuse Brother    Hypertension Father    Hyperlipidemia Father    Heart attack Father    Diabetes Father    Bipolar disorder Son    ADD / ADHD Son    Bipolar disorder Son    ADD / ADHD Son    Asperger's syndrome Son    Bipolar disorder Daughter     Social History   Socioeconomic History   Marital status: Divorced    Spouse name: Not on file   Number of children: Not  on file   Years of education: Not on file   Highest education level: Not on file  Occupational History   Not on file  Tobacco Use   Smoking status: Never   Smokeless tobacco: Never  Vaping Use   Vaping Use: Never used  Substance and Sexual Activity   Alcohol use: No   Drug use: No   Sexual activity: Not Currently    Birth control/protection: Surgical    Comment: tubal and ablation  Other Topics Concern   Not on file  Social History Narrative   Not on file   Social Determinants of Health   Financial Resource Strain: Not on file  Food Insecurity: Not on file  Transportation Needs: Not on file  Physical Activity: Not on file  Stress: Not on file  Social Connections: Not on file  Intimate Partner Violence: Not on file     Physical Exam   Vitals:   12/28/22 2200 12/28/22 2215  BP: 133/73 118/78  Pulse: (!) 112 (!) 112  Resp:    Temp:    SpO2: 100% 98%    CONSTITUTIONAL: Well-appearing, NAD NEURO/PSYCH:  Alert and oriented x 3, no focal  deficits EYES:  eyes equal and reactive ENT/NECK:  no LAD, no JVD CARDIO: Tachycardic rate, well-perfused, normal S1 and S2 PULM:  CTAB no wheezing or rhonchi GI/GU:  non-distended, non-tender MSK/SPINE:  No gross deformities, no edema SKIN:  no rash, atraumatic   *Additional and/or pertinent findings included in MDM below  Diagnostic and Interventional Summary    EKG Interpretation  Date/Time:  Thursday December 29 2022 00:35:44 EDT Ventricular Rate:  98 PR Interval:  155 QRS Duration: 81 QT Interval:  348 QTC Calculation: 445 R Axis:   43 Text Interpretation: Sinus rhythm Baseline wander in lead(s) I aVL V4 Confirmed by Kennis Carina [id number]) on 12/29/2022 1:00:06 AM       Labs Reviewed  CBC WITH DIFFERENTIAL/PLATELET - Abnormal; Notable for the following components:      Result Value   WBC 13.6 (*)    RDW 16.1 (*)    Neutro Abs 10.6 (*)    Monocytes Absolute 1.2 (*)    All other components within normal limits   COMPREHENSIVE METABOLIC PANEL - Abnormal; Notable for the following components:   Potassium 3.1 (*)    Glucose, Bld 128 (*)    All other components within normal limits  BRAIN NATRIURETIC PEPTIDE  TROPONIN I (HIGH SENSITIVITY)  TROPONIN I (HIGH SENSITIVITY)    CT Angio Chest Pulmonary Embolism (PE) W or WO Contrast  Final Result    DG Chest 2 View  Final Result      Medications  albuterol (VENTOLIN HFA) 108 (90 Base) MCG/ACT inhaler 2 puff (has no administration in time range)  cefTRIAXone (ROCEPHIN) 2 g in sodium chloride 0.9 % 100 mL IVPB (has no administration in time range)  azithromycin (ZITHROMAX) 500 mg in sodium chloride 0.9 % 250 mL IVPB (has no administration in time range)  LORazepam (ATIVAN) injection 1 mg (has no administration in time range)  iohexol (OMNIPAQUE) 350 MG/ML injection 100 mL (100 mLs Intravenous Contrast Given 12/28/22 2359)  sodium chloride 0.9 % bolus 500 mL (500 mLs Intravenous New Bag/Given 12/29/22 0100)  prochlorperazine (COMPAZINE) injection 10 mg (10 mg Intravenous Given 12/29/22 0050)  diphenhydrAMINE (BENADRYL) injection 25 mg (25 mg Intravenous Given 12/29/22 0050)     Procedures  /  Critical Care Procedures  ED Course and Medical Decision Making  Initial Impression and Ddx Differential diagnosis includes pneumonia (patient is immunocompromised on prednisone, methotrexate), PE, pulmonary edema.  Tickborne illness still possible though has almost completed doxycycline.  Past medical/surgical history that increases complexity of ED encounter: Rheumatoid arthritis  Interpretation of Diagnostics I personally reviewed the EKG and my interpretation is as follows: Sinus tachycardia  Labs reveal leukocytosis, CT imaging revealing possible pneumonia.  Patient Reassessment and Ultimate Disposition/Management     Given patient's immunocompromise status, active pneumonia despite week of doxycycline will admit to medicine.  Patient management  required discussion with the following services or consulting groups:  Hospitalist Service  Complexity of Problems Addressed Acute illness or injury that poses threat of life of bodily function  Additional Data Reviewed and Analyzed Further history obtained from: Further history from spouse/family member  Additional Factors Impacting ED Encounter Risk Consideration of hospitalization  Elmer Sow. Pilar Plate, MD [facility] Emergency Medicine [facility] [email or web address]  Final Clinical Impressions(s) / ED Diagnoses     ICD-10-CM   1. Community acquired pneumonia, unspecified laterality  J18.9       ED Discharge Orders     None        Discharge  Instructions Discussed with and Provided to Patient:   Discharge Instructions   None      Sabas Sous, MD 12/29/22 0100

## 2022-12-29 NOTE — Assessment & Plan Note (Addendum)
-   Continue Rocephin and azithromycin - Check sputum culture, Legionella and strep urine antigens - Groundglass nodular opacities on CT scan throughout the lung and scattered are indicative of pneumonia - Likely causing her asthma to flare - Curb 65 score of 2 making this patient moderate risk - Continue to monitor

## 2022-12-29 NOTE — H&P (Signed)
History and Physical    Patient: [name] ZOX:[id number] DOB: 11-01-1970 DOA: 12/28/2022 DOS: the patient was seen and examined on 12/29/2022 PCP: Kirstie Peri, MD  Patient coming from: Home  Chief Complaint:  Chief Complaint  Patient presents with   Cough   HPI: [name] is a 52 y.o. female with medical history significant of anemia, anxiety, bipolar disorder, depression, GERD, chronic pain, and more presents to the ED with a chief complaint of myalgias and dyspnea.  Patient reports myalgias came first.  This started about 3 weeks ago.  She noticed that the myalgias onset seem to be associated with tick bite.  She was seen and prescribed 2 pills of doxycycline.  After that she was seen again and prescribed 2 weeks of doxycycline.  Patient reports she only has a few days of this prescription left.  She reports the dyspnea came on several days ago.  Somebody told her to take prednisone when she could not breathe so she has been taking prednisone for the last 2 days.  Patient reports she has been using her inhalers and nebulizers around-the-clock, and more often than they are prescribed.  She has had a cough that is productive of sputum that she does not look at.  She reports feeling feverish and chilled.  She took 3 ibuprofen when she felt feverish and it did improve.  Patient reports she has associated chest pain with her cough.  It is not associated with exertion.  She has not had any nausea or vomiting.  Her last methotrexate dose was a week ago Wednesday.  Patient takes it once a week.  Patient has no other complaints at this time.  She does not smoke, does not drink.  She is vaccinated for COVID and flu.  Patient is full code. Review of Systems: As mentioned in the history of present illness. All other systems reviewed and are negative. Past Medical History:  Diagnosis Date   Anemia    Anxiety    Arthritis    orthopedic   Bipolar disorder (HCC)    Depression     Fibromyalgia    GERD (gastroesophageal reflux disease)    Hypotension    IBS (irritable bowel syndrome)    Migraine    PTSD (post-traumatic stress disorder)    on prazosin   Sleep apnea    Tonsillar hypertrophy    Uterine fibroid    Past Surgical History:  Procedure Laterality Date   ENDOMETRIAL ABLATION     TONSILLECTOMY AND ADENOIDECTOMY N/A 02/13/2018   Procedure: TONSILLECTOMY AND ADENOIDECTOMY;  Surgeon: Newman Pies, MD;  [facility];  Service: ENT;  Laterality: N/A;   TUBAL LIGATION     Social History:  reports that she has never smoked. She has never used smokeless tobacco. She reports that she does not drink alcohol and does not use drugs.  Allergies  Allergen Reactions   Peanut-Containing Drug Products Anaphylaxis    Swelling    Tegretol [Carbamazepine] Shortness Of Breath and Swelling   Lithium Other (See Comments)    Bad acne    Sweet Potato Itching   Penicillins Rash    Family History  Problem Relation Age of Onset   Depression Mother    Hypertension Mother    Hyperlipidemia Mother    Other Mother        bone degeneration   Depression Brother    Alcohol abuse Brother    Alcohol abuse Brother    Hypertension Father  Hyperlipidemia Father    Heart attack Father    Diabetes Father    Bipolar disorder Son    ADD / ADHD Son    Bipolar disorder Son    ADD / ADHD Son    Asperger's syndrome Son    Bipolar disorder Daughter     Prior to Admission medications   Medication Sig Start Date End Date Taking? Authorizing Provider  albuterol (VENTOLIN HFA) 108 (90 Base) MCG/ACT inhaler Inhale 1-2 puffs into the lungs every 6 (six) hours as needed for wheezing or shortness of breath. 05/11/22   [provider]  allopurinol (ZYLOPRIM) 300 MG tablet Take 300 mg by mouth daily. 05/11/22   [provider]  ALPRAZolam Prudy Feeler) 0.5 MG tablet Take 0.5 mg by mouth 2 (two) times daily as needed for anxiety. 06/29/19   [provider]  atorvastatin (LIPITOR) 40 MG tablet Take 40 mg by mouth daily.    [provider]  Budeson-Glycopyrrol-Formoterol (BREZTRI AEROSPHERE) 160-9-4.8 MCG/ACT AERO Inhale 2 puffs into the lungs in the morning and at bedtime. 08/11/22   Martina Sinner, MD  colchicine 0.6 MG tablet Take 0.6 mg by mouth daily. 05/11/22   [provider]  DULoxetine (CYMBALTA) 60 MG capsule Take 60 mg by mouth daily. 06/29/19   [provider]  fluticasone (FLONASE) 50 MCG/ACT nasal spray Place 2 sprays into both nostrils daily. 10/10/22   Cobb, Ruby Cola, NP  folic acid (FOLVITE) 1 MG tablet Take 1 mg by mouth daily.    [provider]  furosemide (LASIX) 20 MG tablet Take 1 tablet (20 mg total) by mouth daily. 07/15/22   Omar Person, MD  HYDROcodone-acetaminophen (NORCO) 5-325 MG tablet Take 1 tablet by mouth every 6 (six) hours as needed for moderate pain. 12/25/22   Carmel Sacramento A, PA-C  hydrOXYzine (ATARAX) 50 MG tablet Take 25 mg by mouth 2 (two) times daily as needed for itching.    [provider]  ipratropium-albuterol (DUONEB) 0.5-2.5 (3) MG/3ML SOLN Take 3 mLs by nebulization every 6 (six) hours as needed. 06/10/22   [provider]  LATUDA 40 MG TABS tablet Take 40 mg by mouth at bedtime. 06/29/19   [provider]  levocetirizine (XYZAL) 5 MG tablet Take 1 tablet (5 mg total) by mouth daily. 10/10/22   Cobb, Ruby Cola, NP  metFORMIN (GLUCOPHAGE) 500 MG tablet Take 500 mg by mouth daily with breakfast.    [provider]  methotrexate 2.5 MG tablet Take 25 mg by mouth once a week. Caution:Chemotherapy. Protect from light.  Takes 10 per day once a week.    [provider]  mirabegron ER (MYRBETRIQ) 25 MG TB24 tablet Take 25 mg by mouth daily.    [provider]  Oxcarbazepine (TRILEPTAL) 300 MG tablet Take 1 tablet (300 mg total) by mouth 2 (two) times daily. Patient taking differently: Take 600  mg by mouth at bedtime. 11/07/13   Myrlene Broker, MD  pantoprazole (PROTONIX) 40 MG tablet Take 1 tablet (40 mg total) by mouth daily. 05/25/22 08/15/22  Vassie Loll, MD  pantoprazole (PROTONIX) 40 MG tablet Take 1 tablet (40 mg total) by mouth daily. 05/25/22 05/25/23  Vassie Loll, MD  prazosin (MINIPRESS) 5 MG capsule Take 5 mg by mouth 2 (two) times daily.    [provider]  pregabalin (LYRICA) 75 MG capsule Take 1 capsule by mouth daily. 06/29/19   [provider]  vitamin B-12 (CYANOCOBALAMIN) 100 MCG tablet  Take 100 mcg by mouth daily.    [provider]    Physical Exam: Vitals:   12/29/22 0200 12/29/22 0300 12/29/22 0400 12/29/22 0408  BP: (!) 140/89 127/84 119/78   Pulse: (!) 103 (!) 105 (!) 104   Resp: (!) 22 (!) 26 (!) 29   Temp: 98.7 F (37.1 C)  98.7 F (37.1 C)   TempSrc: Oral     SpO2: 99% 95% 97% 98%  Weight:      Height:       1.  General: Patient lying supine in bed,  no acute distress   2. Psychiatric: Alert and oriented x 3, mood and behavior normal for situation, pleasant and cooperative with exam   3. Neurologic: Speech and language are normal, face is symmetric, moves all 4 extremities voluntarily, at baseline without acute deficits on limited exam   4. HEENMT:  Head is atraumatic, normocephalic, pupils reactive to light, neck is supple, trachea is midline, mucous membranes are moist   5. Respiratory : Diminished breath sounds in the lower lung fields, otherwise scattered wheezes.  No cyanosis.  Speaking in full sentences.   6. Cardiovascular : Heart rate normal, rhythm is regular, no murmurs, rubs or gallops, no peripheral edema, peripheral pulses palpated   7. Gastrointestinal:  Abdomen is soft, nondistended, nontender to palpation bowel sounds active, no masses or organomegaly palpated   8. Skin:  Skin is warm, dry and intact without rashes, acute lesions, or ulcers on limited exam   9.Musculoskeletal:  No acute  deformities or trauma, no asymmetry in tone, no peripheral edema, peripheral pulses palpated, no tenderness to palpation in the extremities  Data Reviewed: In the ED Temp 100.1, heart rate 110-116, respiratory rate 27-30, blood pressure 115/73-140/88, satting at 98% Leukocytosis with a white blood cell count of 13.6, hemoglobin 12.0 Hypokalemia at 3.1, 40 mEq of potassium given BNP is normal, Trope normal CTA shows no evidence of a PE but does show evidence of pneumonia Admission requested for CAP  Assessment and Plan: * CAP (community acquired pneumonia) - Continue Rocephin and azithromycin - Check sputum culture, Legionella and strep urine antigens - Groundglass nodular opacities on CT scan throughout the lung and scattered are indicative of pneumonia - Likely causing her asthma to flare - Curb 65 score of 2 making this patient moderate risk - Continue to monitor  Hypokalemia - Potassium 3.1 - 40 mEq of potassium replaced - Recheck in the a.m.  Essential hypertension - Continue Minipress  Rheumatoid arthritis (HCC) - Last methotrexate was 1 week prior to arrival - Holding methotrexate  Depression - Depression with anxiety - Continue Cymbalta and Atarax - Continue to monitor  Gastroesophageal reflux disease - Continue Protonix  Mixed hyperlipidemia - Continue atorvastatin  Asthma exacerbation - Secondary to pneumonia - Continue scheduled DuoNeb, as needed albuterol - Continue Dulera - Start steroid treatment - Treat underlying pneumonia      Advance Care Planning:   Code Status: Full Code  Consults: None at this time  Family Communication: Son at bedside  Severity of Illness: The appropriate patient status for this patient is OBSERVATION. Observation status is judged to be reasonable and necessary in order to provide the required intensity of service to ensure the patient's safety. The patient's presenting symptoms, physical exam findings, and initial  radiographic and laboratory data in the context of their medical condition is felt to place them at decreased risk for further clinical deterioration. Furthermore, it is anticipated that the patient will  be medically stable for discharge from the hospital within 2 midnights of admission.   Author: Lilyan Gilford, DO 12/29/2022 4:46 AM  For on call review [email or web address].

## 2022-12-29 NOTE — Assessment & Plan Note (Signed)
Continue Protonix °

## 2022-12-29 NOTE — Assessment & Plan Note (Signed)
-   Potassium 3.1 - 40 mEq of potassium replaced - Recheck in the a.m.

## 2022-12-29 NOTE — Progress Notes (Signed)
Patient seen and evaluated, chart reviewed, please see EMR for updated orders. Please see full H&P dictated by admitting physician Dr. Camillo Flaming for same date of service.   Brief Summary  52 y.o. female with medical history significant of anemia, anxiety, bipolar disorder, depression, GERD, chronic pain due to rheumatoid arthritis on methotrexate therapy for the last 3 years admitted on 12/29/2022 with pneumonia and dyspnea  A/p 1)CAP--- immunosuppressed individual on methotrexate for the last 3 years and intermittent prednisone use -Patient with significant dyspnea and cough -No significant hypoxia -Continue Rocephin, azithromycin bronchodilators and mucolytics  2) acute exacerbation of asthma-due to #1 above -Bronchodilators mucolytics and IV steroids as ordered  3) rheumatoid arthritis/chronic arthralgia--PTA was on methotrexate- -last dose of methotrexate was about a week ago -Solu-Medrol should be helpful at this time  4)GERD--continue Protonix especially while on steroids  5) depressive disorder with anxiety--continue hydroxyzine and Cymbalta  Patient seen and evaluated, chart reviewed, please see EMR for updated orders. Please see full H&P dictated by admitting physician Dr. Camillo Flaming for same date of service.   Total care time 57 minutes  -Plan of care discussed with patient and her son [name] at bedside  Shon Hale, MD

## 2022-12-29 NOTE — ED Notes (Signed)
Pt would like to speak with dietary abut food options. States that there are things she is not abel to eat. Will pass off to day shift RN and to contact dietary.

## 2022-12-29 NOTE — Assessment & Plan Note (Signed)
Continue Minipress.

## 2022-12-29 NOTE — TOC CM/SW Note (Signed)
Transition of Care [facility]) - Inpatient Brief Assessment   Patient Details  Name: [name] MRN: [id number] Date of Birth: January 15, 1971  Transition of Care [facility]) CM/SW Contact:    Elliot Gault, LCSW Phone Number: 12/29/2022, 9:14 AM   Clinical Narrative:  Transition of Care Department [facility]) has reviewed patient and no TOC needs have been identified at this time. We will continue to monitor patient advancement through interdisciplinary progression rounds. If new patient transition needs arise, please place a TOC consult.  Transition of Care Asessment: Insurance and Status: Insurance coverage has been reviewed Patient has primary care physician: Yes Home environment has been reviewed: from home with children Prior level of function:: independent Prior/Current Home Services: No current home services Social Determinants of Health Reivew: SDOH reviewed no interventions necessary Readmission risk has been reviewed: Yes Transition of care needs: no transition of care needs at this time

## 2022-12-30 DIAGNOSIS — I10 Essential (primary) hypertension: Secondary | ICD-10-CM | POA: Diagnosis not present

## 2022-12-30 DIAGNOSIS — J4521 Mild intermittent asthma with (acute) exacerbation: Secondary | ICD-10-CM

## 2022-12-30 DIAGNOSIS — J189 Pneumonia, unspecified organism: Secondary | ICD-10-CM | POA: Diagnosis not present

## 2022-12-30 LAB — BASIC METABOLIC PANEL
Anion gap: 8 (ref 5–15)
BUN: 13 mg/dL (ref 6–20)
CO2: 23 mmol/L (ref 22–32)
Calcium: 9.4 mg/dL (ref 8.9–10.3)
Chloride: 102 mmol/L (ref 98–111)
Creatinine, Ser: 0.68 mg/dL (ref 0.44–1.00)
GFR, Estimated: >60 mL/min (ref >60)
Glucose, Bld: 196 mg/dL — ABNORMAL HIGH (ref 70–99)
Potassium: 4.5 mmol/L (ref 3.5–5.1)
Sodium: 133 mmol/L — ABNORMAL LOW (ref 135–145)

## 2022-12-30 LAB — HEMOGLOBIN A1C
Hgb A1c MFr Bld: 6.3 % — ABNORMAL HIGH (ref 4.8–5.6)
Mean Plasma Glucose: 134.11 mg/dL

## 2022-12-30 LAB — CBC
HCT: 34.4 % — ABNORMAL LOW (ref 36.0–46.0)
Hemoglobin: 11 g/dL — ABNORMAL LOW (ref 12.0–15.0)
MCH: 29 pg (ref 26.0–34.0)
MCHC: 32 g/dL (ref 30.0–36.0)
MCV: 90.8 fL (ref 80.0–100.0)
Platelets: 281 10*3/uL (ref 150–400)
RBC: 3.79 MIL/uL — ABNORMAL LOW (ref 3.87–5.11)
RDW: 16 % — ABNORMAL HIGH (ref 11.5–15.5)
WBC: 10.6 10*3/uL — ABNORMAL HIGH (ref 4.0–10.5)
nRBC: 0 % (ref 0.0–0.2)

## 2022-12-30 MED ORDER — BENZONATATE 200 MG PO CAPS: 200.0000 mg | ORAL_CAPSULE | Freq: Three times a day (TID) | ORAL | 0 refills | Status: DC | PRN

## 2022-12-30 MED ORDER — AZITHROMYCIN 500 MG PO TABS: 500.0000 mg | ORAL_TABLET | Freq: Every day | ORAL | 0 refills | Status: AC

## 2022-12-30 MED ORDER — IPRATROPIUM-ALBUTEROL 0.5-2.5 (3) MG/3ML IN SOLN: 3.0000 mL | Freq: Four times a day (QID) | RESPIRATORY_TRACT | 3 refills | Status: AC | PRN

## 2022-12-30 MED ORDER — BREZTRI AEROSPHERE 160-9-4.8 MCG/ACT IN AERO: 2.0000 | INHALATION_SPRAY | Freq: Two times a day (BID) | RESPIRATORY_TRACT | 3 refills | Status: DC

## 2022-12-30 MED ORDER — ALBUTEROL SULFATE (2.5 MG/3ML) 0.083% IN NEBU: 2.5000 mg | INHALATION_SOLUTION | RESPIRATORY_TRACT | 2 refills | Status: DC | PRN

## 2022-12-30 MED ORDER — HYDROXYZINE HCL 50 MG PO TABS: 25.0000 mg | ORAL_TABLET | Freq: Two times a day (BID) | ORAL | 3 refills | Status: DC | PRN

## 2022-12-30 MED ORDER — PANTOPRAZOLE SODIUM 40 MG PO TBEC: 40.0000 mg | DELAYED_RELEASE_TABLET | Freq: Every day | ORAL | 1 refills | Status: AC

## 2022-12-30 MED ORDER — INSULIN ASPART 100 UNIT/ML IJ SOLN: 0.0000 [IU] | Freq: Every day | INTRAMUSCULAR | Status: DC

## 2022-12-30 MED ORDER — METFORMIN HCL 500 MG PO TABS: 500.0000 mg | ORAL_TABLET | Freq: Every day | ORAL | 3 refills | Status: AC

## 2022-12-30 MED ORDER — IPRATROPIUM-ALBUTEROL 0.5-2.5 (3) MG/3ML IN SOLN
3.0000 mL | Freq: Four times a day (QID) | RESPIRATORY_TRACT | Status: DC
  Administered 2022-12-30 (×2): 3 mL via RESPIRATORY_TRACT
  Filled 2022-12-30 (×2): qty 3

## 2022-12-30 MED ORDER — PREDNISONE 10 MG PO TABS: 10.0000 mg | ORAL_TABLET | ORAL | 0 refills | Status: DC

## 2022-12-30 MED ORDER — INSULIN ASPART 100 UNIT/ML IJ SOLN: 0.0000 [IU] | Freq: Three times a day (TID) | INTRAMUSCULAR | Status: DC

## 2022-12-30 MED ORDER — CEFDINIR 300 MG PO CAPS: 300.0000 mg | ORAL_CAPSULE | Freq: Two times a day (BID) | ORAL | 0 refills | Status: AC

## 2022-12-30 MED ORDER — ALBUTEROL SULFATE HFA 108 (90 BASE) MCG/ACT IN AERS: 2.0000 | INHALATION_SPRAY | RESPIRATORY_TRACT | 2 refills | Status: AC | PRN

## 2022-12-30 MED ORDER — ACETAMINOPHEN 325 MG PO TABS: 650.0000 mg | ORAL_TABLET | Freq: Four times a day (QID) | ORAL | 0 refills | Status: AC | PRN

## 2022-12-30 NOTE — Progress Notes (Signed)
Discharge instructions reviewed with patient. Patient verbalized understanding of instructions, patient discharged home with family in stable condition.   

## 2022-12-30 NOTE — Discharge Summary (Signed)
[name], is a 52 y.o. female  DOB 1971/03/27  MRN [id number].  Admission date:  12/28/2022  Admitting Physician  Shon Hale, MD  Discharge Date:  12/30/2022   Primary MD  Kirstie Peri, MD  Recommendations for primary care physician for things to follow:  1)Prednisone --Take 4 tablets (40 mg) daily x 4 days, then 3 tab (30 mg) daily for 3 days, then 2 Tab (20 mg) daily for 3 days, then 1 tab (10 mg) daily for 3 days   2) hold methotrexate until Wednesday, 01/11/2023  3)follow up with primary care physician shah, Beatrix Fetters, MD --- in about 7 to 10 days for recheck  Admission Diagnosis  CAP (community acquired pneumonia) [J18.9] Community acquired pneumonia, unspecified laterality [J18.9] Pneumonia [J18.9]   Discharge Diagnosis  CAP (community acquired pneumonia) [J18.9] Community acquired pneumonia, unspecified laterality [J18.9] Pneumonia [J18.9]    Principal Problem:   CAP (community acquired pneumonia) Active Problems:   Asthma exacerbation   Mixed hyperlipidemia   Gastroesophageal reflux disease   Depression   Rheumatoid arthritis (HCC)   Essential hypertension   Hypokalemia   Pneumonia      Past Medical History:  Diagnosis Date   Anemia    Anxiety    Arthritis    orthopedic   Bipolar disorder (HCC)    Depression    Fibromyalgia    GERD (gastroesophageal reflux disease)    Hypotension    IBS (irritable bowel syndrome)    Migraine    PTSD (post-traumatic stress disorder)    on prazosin   Sleep apnea    Tonsillar hypertrophy    Uterine fibroid     Past Surgical History:  Procedure Laterality Date   ENDOMETRIAL ABLATION     TONSILLECTOMY AND ADENOIDECTOMY N/A 02/13/2018   Procedure: TONSILLECTOMY AND ADENOIDECTOMY;  Surgeon: Newman Pies, MD;  Location: [facility];  Service: ENT;  Laterality: N/A;   TUBAL LIGATION       HPI  from the history and  physical done on the day of admission:  HPI: [name] is a 52 y.o. female with medical history significant of anemia, anxiety, bipolar disorder, depression, GERD, chronic pain, and more presents to the ED with a chief complaint of myalgias and dyspnea.  Patient reports myalgias came first.  This started about 3 weeks ago.  She noticed that the myalgias onset seem to be associated with tick bite.  She was seen and prescribed 2 pills of doxycycline.  After that she was seen again and prescribed 2 weeks of doxycycline.  Patient reports she only has a few days of this prescription left.  She reports the dyspnea came on several days ago.  Somebody told her to take prednisone when she could not breathe so she has been taking prednisone for the last 2 days.  Patient reports she has been using her inhalers and nebulizers around-the-clock, and more often than they are prescribed.  She has had a cough that is productive of sputum that she does not  look at.  She reports feeling feverish and chilled.  She took 3 ibuprofen when she felt feverish and it did improve.  Patient reports she has associated chest pain with her cough.  It is not associated with exertion.  She has not had any nausea or vomiting.  Her last methotrexate dose was a week ago Wednesday.  Patient takes it once a week.  Patient has no other complaints at this time.   She does not smoke, does not drink.  She is vaccinated for COVID and flu.  Patient is full code. Review of Systems: As mentioned in the history of present illness. All other systems reviewed and are negative.     Hospital Course:    Assessment and Plan:  Brief Summary  52 y.o. female with medical history significant of anemia, anxiety, bipolar disorder, depression, GERD, chronic pain due to rheumatoid arthritis on methotrexate therapy for the last 3 years admitted on 12/29/2022 with pneumonia and dyspnea   A/p 1)CAP----POA - Groundglass nodular opacities on CT scan  throughout the lung and scattered are indicative of pneumonia - immunosuppressed individual on methotrexate for the last 3 years and intermittent prednisone use -Cough and dyspnea improving --Treated with Rocephin, azithromycin bronchodilators and mucolytics -Ambulated more than 200 feet, O2 sats 97 to 99% on room air with ambulation on post ambulation -Discharge on oral azithromycin and Omnicef along with bronchodilators prednisone taper and antitussive   2) acute exacerbation of asthma-due to #1 above -Management as above with prednisone taper   3) rheumatoid arthritis/chronic arthralgia--PTA was on methotrexate- -last dose of methotrexate was about a week ago -Steroids should help her rheumatoid arthritis at this time   4)GERD--continue Protonix especially while on steroids   5) depressive disorder with anxiety--continue hydroxyzine and Cymbalta   6)HTN-stable resume PTA BP meds including Minipress  7)HLD- Continue atorvastatin  Discharge Condition: stable  Follow UP   Follow-up Information     Kirstie Peri, MD Follow up in 1 week(s).   Specialty: Internal Medicine Contact information: [address] [place] [address] [phone]                Diet and Activity recommendation:  As advised  Discharge Instructions    Discharge Instructions     Call MD for:  difficulty breathing, headache or visual disturbances   Complete by: As directed    Call MD for:  persistant dizziness or light-headedness   Complete by: As directed    Call MD for:  persistant nausea and vomiting   Complete by: As directed    Call MD for:  temperature >100.4   Complete by: As directed    Diet - low sodium heart healthy   Complete by: As directed    Discharge instructions   Complete by: As directed    1)Prednisone --Take 4 tablets (40 mg) daily x 4 days, then 3 tab (30 mg) daily for 3 days, then 2 Tab (20 mg) daily for 3 days, then 1 tab (10 mg) daily for 3 days   2) hold  methotrexate until Wednesday, 01/11/2023  3)follow up with primary care physician shah, Beatrix Fetters, MD --- in about 7 to 10 days for recheck   Increase activity slowly   Complete by: As directed        Discharge Medications     Allergies as of 12/30/2022       Reactions   Peanut-containing Drug Products Anaphylaxis   Swelling   Tegretol [carbamazepine] Shortness Of Breath, Swelling  Beef Allergy    Does not eat red meat   Lithium Other (See Comments)   Bad acne    Pork-derived Products Other (See Comments)   Does not eat pork products   Sweet Potato Itching   Penicillins Rash        Medication List     STOP taking these medications    doxycycline 100 MG capsule Commonly known as: VIBRAMYCIN       TAKE these medications    acetaminophen 325 MG tablet Commonly known as: TYLENOL Take 2 tablets (650 mg total) by mouth every 6 (six) hours as needed for mild pain (or Fever >/= 101).   albuterol 108 (90 Base) MCG/ACT inhaler Commonly known as: VENTOLIN HFA Inhale 2 puffs into the lungs every 4 (four) hours as needed for wheezing or shortness of breath. What changed: how much to take   albuterol (2.5 MG/3ML) 0.083% nebulizer solution Commonly known as: PROVENTIL Take 3 mLs (2.5 mg total) by nebulization every 4 (four) hours as needed for wheezing or shortness of breath. What changed: You were already taking a medication with the same name, and this prescription was added. Make sure you understand how and when to take each.   allopurinol 300 MG tablet Commonly known as: ZYLOPRIM Take 300 mg by mouth daily.   ALPRAZolam 0.5 MG tablet Commonly known as: XANAX Take 0.5 mg by mouth 2 (two) times daily as needed for anxiety.   atorvastatin 40 MG tablet Commonly known as: LIPITOR Take 40 mg by mouth daily.   azithromycin 500 MG tablet Commonly known as: ZITHROMAX Take 1 tablet (500 mg total) by mouth daily for 3 days.   benzonatate 200 MG capsule Commonly known  as: TESSALON Take 1 capsule (200 mg total) by mouth 3 (three) times daily as needed for cough.   Breztri Aerosphere 160-9-4.8 MCG/ACT Aero Generic drug: Budeson-Glycopyrrol-Formoterol Inhale 2 puffs into the lungs in the morning and at bedtime.   cefdinir 300 MG capsule Commonly known as: OMNICEF Take 1 capsule (300 mg total) by mouth 2 (two) times daily for 5 days.   cholecalciferol 25 MCG (1000 UNIT) tablet Commonly known as: VITAMIN D3 Take 4,000 Units by mouth daily.   colchicine 0.6 MG tablet Take 0.6 mg by mouth daily.   diclofenac Sodium 1 % Gel Commonly known as: VOLTAREN Apply 1 Application topically 4 (four) times daily.   DULoxetine 60 MG capsule Commonly known as: CYMBALTA Take 60 mg by mouth daily.   folic acid 1 MG tablet Commonly known as: FOLVITE Take 1 mg by mouth daily.   furosemide 20 MG tablet Commonly known as: LASIX Take 1 tablet (20 mg total) by mouth daily.   HYDROcodone-acetaminophen 5-325 MG tablet Commonly known as: Norco Take 1 tablet by mouth every 6 (six) hours as needed for moderate pain.   hydrOXYzine 50 MG tablet Commonly known as: ATARAX Take 0.5 tablets (25 mg total) by mouth 2 (two) times daily as needed for itching or anxiety. What changed: reasons to take this   ipratropium-albuterol 0.5-2.5 (3) MG/3ML Soln Commonly known as: DUONEB Take 3 mLs by nebulization every 6 (six) hours as needed.   Latuda 40 MG Tabs tablet Generic drug: lurasidone Take 40 mg by mouth at bedtime.   levocetirizine 5 MG tablet Commonly known as: XYZAL Take 1 tablet (5 mg total) by mouth daily.   metFORMIN 500 MG tablet Commonly known as: GLUCOPHAGE Take 1 tablet (500 mg total) by mouth daily with breakfast.  methotrexate 2.5 MG tablet Commonly known as: RHEUMATREX Take 25 mg by mouth once a week. Caution:Chemotherapy. Protect from light.  Takes 10 per day once a week.   mirabegron ER 25 MG Tb24 tablet Commonly known as: MYRBETRIQ Take 25  mg by mouth daily.   Oxcarbazepine 300 MG tablet Commonly known as: TRILEPTAL Take 1 tablet (300 mg total) by mouth 2 (two) times daily. What changed:  how much to take when to take this   pantoprazole 40 MG tablet Commonly known as: Protonix Take 1 tablet (40 mg total) by mouth daily. What changed: Another medication with the same name was removed. Continue taking this medication, and follow the directions you see here.   prazosin 5 MG capsule Commonly known as: MINIPRESS Take 5 mg by mouth 2 (two) times daily.   predniSONE 10 MG tablet Commonly known as: DELTASONE Take 1 tablet (10 mg total) by mouth See admin instructions. Take 4 tablets (40 mg) daily x 4 days, then 3 tab (30 mg) daily for 3 days, then 2 Tab (20 mg) daily for 3 days, then 1 tab (10 mg) daily for 3 days   pregabalin 75 MG capsule Commonly known as: LYRICA Take 1 capsule by mouth daily.   promethazine 25 MG tablet Commonly known as: PHENERGAN Take 25 mg by mouth every 6 (six) hours as needed.   vitamin B-12 100 MCG tablet Commonly known as: CYANOCOBALAMIN Take 100 mcg by mouth daily.       Major procedures and Radiology Reports - PLEASE review detailed and final reports for all details, in brief -   CT Angio Chest Pulmonary Embolism (PE) W or WO Contrast  Result Date: 12/29/2022 CLINICAL DATA:  Pulmonary embolism (PE) suspected, high prob vs pna vs edema. Cough, fever EXAM: CT ANGIOGRAPHY CHEST WITH CONTRAST TECHNIQUE: Multidetector CT imaging of the chest was performed using the standard protocol during bolus administration of intravenous contrast. Multiplanar CT image reconstructions and MIPs were obtained to evaluate the vascular anatomy. RADIATION DOSE REDUCTION: This exam was performed according to the departmental dose-optimization program which includes automated exposure control, adjustment of the mA and/or kV according to patient size and/or use of iterative reconstruction technique. CONTRAST:   OMNIPAQUE IOHEXOL 350 MG/ML SOLN COMPARISON:  05/23/2022 FINDINGS: Cardiovascular: No filling defects in the pulmonary arteries to suggest pulmonary emboli. Heart is normal size. Aorta is normal caliber. Mediastinum/Nodes: Moderate sized hiatal hernia, stable. Borderline sized bilateral hilar and mediastinal lymph nodes. No axillary adenopathy. Trachea and esophagus are unremarkable. 1 cm low-density lesion in the right thyroid lobe, smaller since prior study. Not clinically significant; no follow-up imaging recommended (ref: J Am Coll Radiol. 2015 Feb;12(2): 143-50). Lungs/Pleura: Peribronchial thickening. Nodular ground-glass opacities throughout the right lung, most pronounced in the right lower lobe and posterior right upper lobe. Few scattered ground-glass nodular areas within the left lung. Findings most likely infectious/inflammatory. No effusions. Upper Abdomen: No acute findings Musculoskeletal: Chest wall soft tissues are unremarkable. No acute bony abnormality. Review of the MIP images confirms the above findings. IMPRESSION: No evidence of pulmonary embolus. Ground-glass nodular opacities throughout the right lung and scattered less pronounced throughout the left lung. Findings most likely infectious/inflammatory. Borderline sized mediastinal and bilateral hilar lymph nodes, likely reactive. Moderate-sized hiatal hernia. Electronically Signed   By: Charlett Nose M.D.   On: 12/29/2022 00:19   DG Chest 2 View  Result Date: 12/28/2022 CLINICAL DATA:  Cough and chills x3 days. EXAM: CHEST - 2 VIEW COMPARISON:  December 25, 2022 FINDINGS: The heart size and mediastinal contours are within normal limits. Mild, diffusely increased lung markings are seen. Mild atelectatic changes are noted within the bilateral lung bases. No pleural effusion or pneumothorax is identified. The visualized skeletal structures are unremarkable. IMPRESSION: Mild diffusely increased lung markings with mild bibasilar atelectasis.  Electronically Signed   By: Aram Candela M.D.   On: 12/28/2022 19:10   DG Chest 2 View  Result Date: 12/25/2022 CLINICAL DATA:  productive cough EXAM: CHEST - 2 VIEW COMPARISON:  05/23/2022 chest radiograph. FINDINGS: Stable cardiomediastinal silhouette with normal heart size. No pneumothorax. No pleural effusion. Low lung volumes. No pulmonary edema. No acute consolidative airspace disease. IMPRESSION: No active cardiopulmonary disease. Electronically Signed   By: Delbert Phenix M.D.   On: 12/25/2022 11:27    Micro Results  Recent Results (from the past 240 hour(s))  Resp panel by RT-PCR (RSV, Flu A&B, Covid) Anterior Nasal Swab     Status: None   Collection Time: 12/25/22 11:22 AM   Specimen: Anterior Nasal Swab  Result Value Ref Range Status   SARS Coronavirus 2 by RT PCR NEGATIVE NEGATIVE Final    Comment: (NOTE) SARS-CoV-2 target nucleic acids are NOT DETECTED.  The SARS-CoV-2 RNA is generally detectable in upper respiratory specimens during the acute phase of infection. The lowest concentration of SARS-CoV-2 viral copies this assay can detect is 138 copies/mL. A negative result does not preclude SARS-Cov-2 infection and should not be used as the sole basis for treatment or other patient management decisions. A negative result may occur with  improper specimen collection/handling, submission of specimen other than nasopharyngeal swab, presence of viral mutation(s) within the areas targeted by this assay, and inadequate number of viral copies(<138 copies/mL). A negative result must be combined with clinical observations, patient history, and epidemiological information. The expected result is Negative.  Fact Sheet for Patients:  BloggerCourse.com  Fact Sheet for Healthcare Providers:  SeriousBroker.it  This test is no t yet approved or cleared by the [place] FDA and  has been authorized for detection and/or diagnosis of  SARS-CoV-2 by FDA under an Emergency Use Authorization (EUA). This EUA will remain  in effect (meaning this test can be used) for the duration of the COVID-19 declaration under Section 564(b)(1) of the Act, 21 U.S.C.section 360bbb-3(b)(1), unless the authorization is terminated  or revoked sooner.       Influenza A by PCR NEGATIVE NEGATIVE Final   Influenza B by PCR NEGATIVE NEGATIVE Final    Comment: (NOTE) The Xpert Xpress SARS-CoV-2/FLU/RSV plus assay is intended as an aid in the diagnosis of influenza from Nasopharyngeal swab specimens and should not be used as a sole basis for treatment. Nasal washings and aspirates are unacceptable for Xpert Xpress SARS-CoV-2/FLU/RSV testing.  Fact Sheet for Patients: BloggerCourse.com  Fact Sheet for Healthcare Providers: SeriousBroker.it  This test is not yet approved or cleared by the [place] FDA and has been authorized for detection and/or diagnosis of SARS-CoV-2 by FDA under an Emergency Use Authorization (EUA). This EUA will remain in effect (meaning this test can be used) for the duration of the COVID-19 declaration under Section 564(b)(1) of the Act, 21 U.S.C. section 360bbb-3(b)(1), unless the authorization is terminated or revoked.     Resp Syncytial Virus by PCR NEGATIVE NEGATIVE Final    Comment: (NOTE) Fact Sheet for Patients: BloggerCourse.com  Fact Sheet for Healthcare Providers: SeriousBroker.it  This test is not yet approved or cleared by the [place] and  has been authorized for detection and/or diagnosis of SARS-CoV-2 by FDA under an Emergency Use Authorization (EUA). This EUA will remain in effect (meaning this test can be used) for the duration of the COVID-19 declaration under Section 564(b)(1) of the Act, 21 U.S.C. section 360bbb-3(b)(1), unless the authorization is terminated  or revoked.  Performed at [facility], [address] [place], [place] [address]   Resp panel by RT-PCR (RSV, Flu A&B, Covid) Anterior Nasal Swab     Status: None   Collection Time: 12/29/22  4:19 AM   Specimen: Anterior Nasal Swab  Result Value Ref Range Status   SARS Coronavirus 2 by RT PCR NEGATIVE NEGATIVE Final    Comment: (NOTE) SARS-CoV-2 target nucleic acids are NOT DETECTED.  The SARS-CoV-2 RNA is generally detectable in upper respiratory specimens during the acute phase of infection. The lowest concentration of SARS-CoV-2 viral copies this assay can detect is 138 copies/mL. A negative result does not preclude SARS-Cov-2 infection and should not be used as the sole basis for treatment or other patient management decisions. A negative result may occur with  improper specimen collection/handling, submission of specimen other than nasopharyngeal swab, presence of viral mutation(s) within the areas targeted by this assay, and inadequate number of viral copies(<138 copies/mL). A negative result must be combined with clinical observations, patient history, and epidemiological information. The expected result is Negative.  Fact Sheet for Patients:  BloggerCourse.com  Fact Sheet for Healthcare Providers:  SeriousBroker.it  This test is no t yet approved or cleared by the [place] FDA and  has been authorized for detection and/or diagnosis of SARS-CoV-2 by FDA under an Emergency Use Authorization (EUA). This EUA will remain  in effect (meaning this test can be used) for the duration of the COVID-19 declaration under Section 564(b)(1) of the Act, 21 U.S.C.section 360bbb-3(b)(1), unless the authorization is terminated  or revoked sooner.       Influenza A by PCR NEGATIVE NEGATIVE Final   Influenza B by PCR NEGATIVE NEGATIVE Final    Comment: (NOTE) The Xpert Xpress SARS-CoV-2/FLU/RSV plus assay is intended as an aid in  the diagnosis of influenza from Nasopharyngeal swab specimens and should not be used as a sole basis for treatment. Nasal washings and aspirates are unacceptable for Xpert Xpress SARS-CoV-2/FLU/RSV testing.  Fact Sheet for Patients: BloggerCourse.com  Fact Sheet for Healthcare Providers: SeriousBroker.it  This test is not yet approved or cleared by the [place] FDA and has been authorized for detection and/or diagnosis of SARS-CoV-2 by FDA under an Emergency Use Authorization (EUA). This EUA will remain in effect (meaning this test can be used) for the duration of the COVID-19 declaration under Section 564(b)(1) of the Act, 21 U.S.C. section 360bbb-3(b)(1), unless the authorization is terminated or revoked.     Resp Syncytial Virus by PCR NEGATIVE NEGATIVE Final    Comment: (NOTE) Fact Sheet for Patients: BloggerCourse.com  Fact Sheet for Healthcare Providers: SeriousBroker.it  This test is not yet approved or cleared by the [place] FDA and has been authorized for detection and/or diagnosis of SARS-CoV-2 by FDA under an Emergency Use Authorization (EUA). This EUA will remain in effect (meaning this test can be used) for the duration of the COVID-19 declaration under Section 564(b)(1) of the Act, 21 U.S.C. section 360bbb-3(b)(1), unless the authorization is terminated or revoked.  Performed at [facility], [address], [place], [place] [address]    Today   Subjective    [name] today has no new  complaints  - Dyspnea cough is improved  --Ambulated more than 200 feet, O2 sats 97 to 99% on room air with ambulation on post ambulation         Patient has been seen and examined prior to discharge   Objective   Blood pressure (!) 144/82, pulse 94, temperature 98.3 F (36.8 C), temperature source Oral, resp. rate 20, height 5\' 9"  (1.753 m), weight  115.7 kg, SpO2 98 %.   Intake/Output Summary (Last 24 hours) at 12/30/2022 1554 Last data filed at 12/29/2022 1712 Gross per 24 hour  Intake 240 ml  Output --  Net 240 ml    Exam Gen:- Awake Alert, no acute distress , speaking in complete sentences HEENT:- [place].AT, No sclera icterus Neck-Supple Neck,No JVD,.  Lungs-  CTAB , good air movement bilaterally CV- S1, S2 normal, regular Abd-  +ve B.Sounds, Abd Soft, No tenderness,    Extremity/Skin:- No  edema,   good pulses Psych-affect is appropriate, oriented x3 Neuro-no new focal deficits, no tremors    Data Review   CBC w Diff:  Lab Results  Component Value Date   WBC 10.6 (H) 12/30/2022   HGB 11.0 (L) 12/30/2022   HCT 34.4 (L) 12/30/2022   PLT 281 12/30/2022   LYMPHOPCT 20 12/29/2022   MONOPCT 15 12/29/2022   EOSPCT 0 12/29/2022   BASOPCT 1 12/29/2022    CMP:  Lab Results  Component Value Date   NA 133 (L) 12/30/2022   K 4.5 12/30/2022   CL 102 12/30/2022   CO2 23 12/30/2022   BUN 13 12/30/2022   CREATININE 0.68 12/30/2022   PROT 6.5 12/29/2022   ALBUMIN 3.1 (L) 12/29/2022   BILITOT 0.2 (L) 12/29/2022   ALKPHOS 77 12/29/2022   AST 14 (L) 12/29/2022   ALT 16 12/29/2022  .  Total Discharge time is about 33 minutes  Shon Hale M.D on 12/30/2022 at 3:54 PM  Go to [email or web address] -  for contact info  [facility] - Office  [phone]

## 2022-12-30 NOTE — Discharge Instructions (Signed)
1)Prednisone --Take 4 tablets (40 mg) daily x 4 days, then 3 tab (30 mg) daily for 3 days, then 2 Tab (20 mg) daily for 3 days, then 1 tab (10 mg) daily for 3 days   2) hold methotrexate until Wednesday, 01/11/2023  3)follow up with primary care physician shah, Jordan Fetters, MD --- in about 7 to 10 days for recheck

## 2022-12-31 DIAGNOSIS — G4733 Obstructive sleep apnea (adult) (pediatric): Secondary | ICD-10-CM | POA: Diagnosis not present

## 2023-01-16 DIAGNOSIS — M069 Rheumatoid arthritis, unspecified: Secondary | ICD-10-CM | POA: Diagnosis not present

## 2023-01-16 DIAGNOSIS — D849 Immunodeficiency, unspecified: Secondary | ICD-10-CM | POA: Diagnosis not present

## 2023-01-16 DIAGNOSIS — Z299 Encounter for prophylactic measures, unspecified: Secondary | ICD-10-CM | POA: Diagnosis not present

## 2023-01-16 DIAGNOSIS — E1165 Type 2 diabetes mellitus with hyperglycemia: Secondary | ICD-10-CM | POA: Diagnosis not present

## 2023-01-16 DIAGNOSIS — E11649 Type 2 diabetes mellitus with hypoglycemia without coma: Secondary | ICD-10-CM | POA: Diagnosis not present

## 2023-01-25 ENCOUNTER — Telehealth: Payer: Self-pay | Admitting: Student

## 2023-01-25 NOTE — Telephone Encounter (Signed)
PT needs CPAP supplies called in and a extension on her 90 day CPAP compliance usage because she has pneumonia currently.  PT has [facility] Rep calling us today to  request this. If you have quest PT # is [phone]  Needs:   Cushions and filter RX call in to supplier.

## 2023-01-26 NOTE — Telephone Encounter (Signed)
Called and spoke with patient. She stated that she has been battling PNA and has not been able to use her cpap machine. Per patient, she is requesting for more time to meet her compliance. She received a message from [employer] stating that her 90 days are almost up and she needs to request an extension before insurance denies coverage for her machine.   Patient provided me with [facility] patient advocate number [phone]. I advised her that I would give them a call.   Called and spoke with Angelique Blonder at [facility] and explained the patient's situation. She stated that she made a note in the patient's account about her case and that [facility] will not penalize her for being sick and not using her cpap machine. As soon as she keeps her follow up appt and the notes show documentation that she was sick and the insurance receives a copy of these, the patient will be ok. They will continue to pay for her supplies.   Called and spoke with patient. She was made aware of the above information and verbalized understanding. Reminded her of her appt next week.   Nothing further needed at time of call.

## 2023-01-26 NOTE — Telephone Encounter (Signed)
[facility] Patient Advocate calling again. Please reply.

## 2023-01-27 DIAGNOSIS — G4733 Obstructive sleep apnea (adult) (pediatric): Secondary | ICD-10-CM | POA: Diagnosis not present

## 2023-01-30 DIAGNOSIS — G4733 Obstructive sleep apnea (adult) (pediatric): Secondary | ICD-10-CM | POA: Diagnosis not present

## 2023-02-01 ENCOUNTER — Encounter: Payer: Self-pay | Admitting: Nurse Practitioner

## 2023-02-01 ENCOUNTER — Ambulatory Visit: Payer: 59 | Admitting: Nurse Practitioner

## 2023-02-01 VITALS — BP 116/84 | HR 73 | Temp 98.0°F | Ht 67.5 in | Wt 248.2 lb

## 2023-02-01 DIAGNOSIS — J309 Allergic rhinitis, unspecified: Secondary | ICD-10-CM

## 2023-02-01 DIAGNOSIS — J4541 Moderate persistent asthma with (acute) exacerbation: Secondary | ICD-10-CM

## 2023-02-01 DIAGNOSIS — G4733 Obstructive sleep apnea (adult) (pediatric): Secondary | ICD-10-CM | POA: Diagnosis not present

## 2023-02-01 DIAGNOSIS — J189 Pneumonia, unspecified organism: Secondary | ICD-10-CM

## 2023-02-01 NOTE — Assessment & Plan Note (Signed)
Mildly moderate OSA on CPAP. She has suboptimal compliance. Some of this seems to be due to mask and pressure problems. Dreamwear full face mask ordered and will adjust her settings to 8-20 cmH2O. May consider CPAP titration if she continues to have difficulties. Aware of proper care/use. Understands risks of untreated OSA. Cautioned on safe driving practices.

## 2023-02-01 NOTE — Progress Notes (Signed)
@Patient  ID: [name], female    DOB: 07-02-1971, 52 y.o.   MRN: [id number]  Chief Complaint  Patient presents with   Follow-up    Still getting adjusted to cpap machine     Referring provider: Kirstie Peri, MD  HPI: 52 year old patient, never smoker followed for asthma and OSA. She is a patient of [name] and last seen 10/10/2022. Past medical history significant for GERD, RA on methotrexate, bipolar, depression.   TESTS/EVENTS: 10/06/2022 PFT: FVC 65, FEV1 68, ratio 88, TLC 74, DLCOcor 72 12/28/2022 CTA chest: No PE.  Moderate sized hiatal hernia, stable.  Borderline sized bilateral hilar and mediastinal lymph nodes.  1 cm low-density lesion in the right thyroid lobe, smaller than prior study.  Parabronchial thickening.  Nodular groundglass opacities throughout the right lung, most pronounced in right lower lobe and posterior right upper lobe.  Few scattered groundglass nodularities in the left lung. Likely infectious/inflammatory  06/28/2022: OV with Dr. [name] for hospital follow up. She was admitted 11/6-11/8 for asthma exacerbation due to RSV infection. She had been having DOE since    10/10/2022: Ov with Cheyrl Buley NP for follow up after undergoing PFTs and HST. She was found to have a restrictive defect and minimal diffusion defect on her pulmonary function testing. Her home sleep study revealed mildly moderate OSA. She has never been treated for this with CPAP before.  Today, she tells me that she was doing much better after she started the [facility] inhaler. Over the past few days (starting approx 3/21), she developed sinus congestion, sneezing, watery eyes. She thought it was allergies. She now has a cough and is feeling like her chest is a little tighter, especially when she lays back. She also notices more wheezing at night. She feels like she gets more short winded with activities around the house but doesn't feel like her shortness of breath is severe. She denies any fevers,  chills, hemoptysis, leg swelling. She is using her Breztri twice a day. Increased use of albuterol over the past few days. She is not currently on anything for allergies.  Regarding her sleep, she feels very tired during the day. She snores at night. She denies any drowsy driving or morning headaches. She would like to know what treatment options there are.   02/01/2023: Today - follow up Patient presents today for follow up.  She was recently hospitalized from 12/28/2022-12/30/2022 for pneumonia treated with cefdinir and azithromycin.  Prior to this she had been seen in the emergency department 12/25/2022 for myalgias/arthralgias.  She had, had tick bites prior to this and had been on doxycycline.  She was provided with Norco and discharged home.  She also had viral testing that was negative. She tells me today that she is finally starting to feel better.  She is still having some more shortness of breath compared to her baseline and she does not feel like her stamina is quite back to where it was before.  She does still have an occasional cough but it is primarily dry.  Feels like it is improving.  She has not noticed quite as much wheezing.  Denies any fevers, chills, hemoptysis.  Myalgias and arthralgias are better as well.  She is still using her nebs twice a day.  She is been able to back off from 4 times a day.  Still on Breztri twice a day.  Is having difficulties with her taste and smell.  Does not feel like she can taste  much of anything and she is not able to smell her candles like she usually is.  She did test negative for COVID so she is not sure why this is.  Does have some sinus symptoms which do not feel much worse than her baseline.  She was using Flonase a few times a day but then it made her nosebleed. She has been having trouble with her CPAP due to not feeling like she is getting enough air.  She says that she thought this was related to recovering from pneumonia but then she also thinks that  it might be a mass problem.  She is used a couple different masks.  Currently settled on nasal pillow which she still does not always feel it gives her enough air at times.  She tends to have to open her mouth to breathe more easily.  She denies any drowsy driving or morning headaches.  01/01/2023-01/30/2023: CPAP auto 5-15 cmH2O 13/30 days; 17% > 4 hours; average use 3 hours 28 minutes Pressure 95th 10.1 Leaks 95th 8.2 AHI 1.9  Allergies  Allergen Reactions   Peanut-Containing Drug Products Anaphylaxis    Swelling    Tegretol [Carbamazepine] Shortness Of Breath and Swelling   Beef Allergy     Does not eat red meat   Lithium Other (See Comments)    Bad acne    Pork-Derived Products Other (See Comments)    Does not eat pork products   Sweet Potato Itching   Penicillins Rash    There is no immunization history for the selected administration types on file for this patient.  Past Medical History:  Diagnosis Date   Anemia    Anxiety    Arthritis    orthopedic   Bipolar disorder (HCC)    Depression    Fibromyalgia    GERD (gastroesophageal reflux disease)    Hypotension    IBS (irritable bowel syndrome)    Migraine    PTSD (post-traumatic stress disorder)    on prazosin   Sleep apnea    Tonsillar hypertrophy    Uterine fibroid     Tobacco History: Social History   Tobacco Use  Smoking Status Never  Smokeless Tobacco Never   Counseling given: Not Answered   Outpatient Medications Prior to Visit  Medication Sig Dispense Refill   albuterol (PROVENTIL) (2.5 MG/3ML) 0.083% nebulizer solution Take 3 mLs (2.5 mg total) by nebulization every 4 (four) hours as needed for wheezing or shortness of breath. 75 mL 2   albuterol (VENTOLIN HFA) 108 (90 Base) MCG/ACT inhaler Inhale 2 puffs into the lungs every 4 (four) hours as needed for wheezing or shortness of breath. 18 g 2   allopurinol (ZYLOPRIM) 300 MG tablet Take 300 mg by mouth daily.     ALPRAZolam (XANAX) 0.5 MG  tablet Take 0.5 mg by mouth 2 (two) times daily as needed for anxiety.     atorvastatin (LIPITOR) 40 MG tablet Take 40 mg by mouth daily.     benzonatate (TESSALON) 200 MG capsule Take 1 capsule (200 mg total) by mouth 3 (three) times daily as needed for cough. 20 capsule 0   Budeson-Glycopyrrol-Formoterol (BREZTRI AEROSPHERE) 160-9-4.8 MCG/ACT AERO Inhale 2 puffs into the lungs in the morning and at bedtime. 10.7 g 3   cholecalciferol (VITAMIN D3) 25 MCG (1000 UNIT) tablet Take 4,000 Units by mouth daily.     colchicine 0.6 MG tablet Take 0.6 mg by mouth daily.     diclofenac Sodium (VOLTAREN) 1 % GEL  Apply 1 Application topically 4 (four) times daily.     DULoxetine (CYMBALTA) 60 MG capsule Take 60 mg by mouth daily.     folic acid (FOLVITE) 1 MG tablet Take 1 mg by mouth daily.     furosemide (LASIX) 20 MG tablet Take 1 tablet (20 mg total) by mouth daily. 30 tablet 1   hydrOXYzine (ATARAX) 50 MG tablet Take 0.5 tablets (25 mg total) by mouth 2 (two) times daily as needed for itching or anxiety. 30 tablet 3   ipratropium-albuterol (DUONEB) 0.5-2.5 (3) MG/3ML SOLN Take 3 mLs by nebulization every 6 (six) hours as needed. 360 mL 3   LATUDA 40 MG TABS tablet Take 40 mg by mouth at bedtime.     levocetirizine (XYZAL) 5 MG tablet Take 1 tablet (5 mg total) by mouth daily. 30 tablet 5   metFORMIN (GLUCOPHAGE) 500 MG tablet Take 1 tablet (500 mg total) by mouth daily with breakfast. 90 tablet 3   mirabegron ER (MYRBETRIQ) 25 MG TB24 tablet Take 25 mg by mouth daily.     Oxcarbazepine (TRILEPTAL) 300 MG tablet Take 1 tablet (300 mg total) by mouth 2 (two) times daily. (Patient taking differently: Take 600 mg by mouth at bedtime.) 60 tablet 2   pantoprazole (PROTONIX) 40 MG tablet Take 1 tablet (40 mg total) by mouth daily. 30 tablet 1   prazosin (MINIPRESS) 5 MG capsule Take 5 mg by mouth 2 (two) times daily.     predniSONE (DELTASONE) 10 MG tablet Take 1 tablet (10 mg total) by mouth See admin  instructions. Take 4 tablets (40 mg) daily x 4 days, then 3 tab (30 mg) daily for 3 days, then 2 Tab (20 mg) daily for 3 days, then 1 tab (10 mg) daily for 3 days 40 tablet 0   pregabalin (LYRICA) 75 MG capsule Take 1 capsule by mouth daily.     vitamin B-12 (CYANOCOBALAMIN) 100 MCG tablet Take 100 mcg by mouth daily.     acetaminophen (TYLENOL) 325 MG tablet Take 2 tablets (650 mg total) by mouth every 6 (six) hours as needed for mild pain (or Fever >/= 101). (Patient not taking: Reported on 02/01/2023) 100 tablet 0   HYDROcodone-acetaminophen (NORCO) 5-325 MG tablet Take 1 tablet by mouth every 6 (six) hours as needed for moderate pain. (Patient not taking: Reported on 02/01/2023) 6 tablet 0   methotrexate 2.5 MG tablet Take 25 mg by mouth once a week. Caution:Chemotherapy. Protect from light.  Takes 10 per day once a week. (Patient not taking: Reported on 02/01/2023)     promethazine (PHENERGAN) 25 MG tablet Take 25 mg by mouth every 6 (six) hours as needed.     No facility-administered medications prior to visit.     Review of Systems:   Constitutional: No weight loss or gain, night sweats, fevers, chills. +fatigue, lassitude (improving) HEENT: No headaches, difficulty swallowing, tooth/dental problems, or sore throat. No sneezing, itching, ear ache. +nasal congestion, loss of taste and smell CV:  No chest pain, orthopnea, PND, swelling in lower extremities, anasarca, dizziness, palpitations, syncope Resp: +shortness of breath with exertion; occasional dry cough No excess mucus or change in color of mucus.  No hemoptysis. No wheezing.  No chest wall deformity GI:  No heartburn, indigestion, abdominal pain, nausea, vomiting, diarrhea, change in bowel habits, loss of appetite, bloody stools.  GU: No dysuria, change in color of urine, urgency or frequency.   Skin: No rash, lesions, ulcerations MSK:  No increased joint  pain or swelling.   Neuro: No dizziness or lightheadedness.  Psych: No  depression or anxiety. Mood stable.     Physical Exam:  BP 116/84 (BP Location: Right Arm, Patient Position: Sitting, Cuff Size: Normal)   Pulse 73   Temp 98 F (36.7 C) (Oral)   Ht 5' 7.5" (1.715 m)   Wt 248 lb 3.2 oz (112.6 kg)   SpO2 95%   BMI 38.30 kg/m   GEN: Pleasant, interactive, well-appearing; obese; in no acute distress. HEENT:  Normocephalic and atraumatic. PERRLA. Sclera white. Nasal turbinates pink, moist and patent bilaterally. No rhinorrhea present. Oropharynx pink and moist, without exudate or edema. No lesions, ulcerations, or postnasal drip.  NECK:  Supple w/ fair ROM. No JVD present. Normal carotid impulses w/o bruits. Thyroid symmetrical with no goiter or nodules palpated. No lymphadenopathy.   CV: RRR, no m/r/g, no peripheral edema. Pulses intact, +2 bilaterally. No cyanosis, pallor or clubbing. PULMONARY:  Unlabored, regular breathing. Clear bilaterally A&P w/o wheezes/rales/rhonchi. No accessory muscle use.  GI: BS present and normoactive. Soft, non-tender to palpation. No organomegaly or masses detected.  MSK: No erythema, warmth or tenderness. Cap refil <2 sec all extrem. No deformities or joint swelling noted.  Neuro: A/Ox3. No focal deficits noted.   Skin: Warm, no lesions or rashe Psych: Normal affect and behavior. Judgement and thought content appropriate.     Lab Results:  CBC    Component Value Date/Time   WBC 10.6 (H) 12/30/2022 0438   RBC 3.79 (L) 12/30/2022 0438   HGB 11.0 (L) 12/30/2022 0438   HCT 34.4 (L) 12/30/2022 0438   PLT 281 12/30/2022 0438   MCV 90.8 12/30/2022 0438   MCH 29.0 12/30/2022 0438   MCHC 32.0 12/30/2022 0438   RDW 16.0 (H) 12/30/2022 0438   LYMPHSABS 1.7 12/29/2022 0554   MONOABS 1.3 (H) 12/29/2022 0554   EOSABS 0.0 12/29/2022 0554   BASOSABS 0.1 12/29/2022 0554    BMET    Component Value Date/Time   NA 133 (L) 12/30/2022 0438   K 4.5 12/30/2022 0438   CL 102 12/30/2022 0438   CO2 23 12/30/2022 0438    GLUCOSE 196 (H) 12/30/2022 0438   BUN 13 12/30/2022 0438   CREATININE 0.68 12/30/2022 0438   CALCIUM 9.4 12/30/2022 0438   GFRNONAA >60 12/30/2022 0438   GFRAA >60 03/29/2020 1009    BNP    Component Value Date/Time   BNP 13.0 12/28/2022 2019     Imaging:  No results found.  Administration History     None          Latest Ref Rng & Units 10/06/2022    2:23 PM  PFT Results  FVC-Pre L 2.63   FVC-Predicted Pre % 65   FVC-Post L 2.64   FVC-Predicted Post % 65   Pre FEV1/FVC % % 84   Post FEV1/FCV % % 88   FEV1-Pre L 2.20   FEV1-Predicted Pre % 68   FEV1-Post L 2.33   DLCO uncorrected ml/min/mmHg 17.40   DLCO UNC% % 72   DLCO corrected ml/min/mmHg 17.40   DLCO COR %Predicted % 72   DLVA Predicted % 116   TLC L 4.18   TLC % Predicted % 74   RV % Predicted % 76     No results found for: "NITRICOXIDE"      Assessment & Plan:   CAP (community acquired pneumonia) Clinically improving. Lung exam clear today. Infectious symptoms resolved. She will need repeat CT chest  imaging in 4 weeks to assess for resolution. Continue bronchodilator regimen and mucociliary clearance therapies. Strict return precautions.  Patient Instructions  Continue Breztri 2 puffs Twice daily. Brush tongue and rinse mouth afterwards Continue Albuterol inhaler 2 puffs or 3 mL every 6 hours as needed for shortness of breath or wheezing. Notify if symptoms persist despite rescue inhaler/neb use. Continue levocetirizine 5 mg daily for allergies - new prescription sent to pharmacy    -Saline nasal rinses (Netti Pot or [employer]) 1-2 times a day. Use the flonase about 20-30 min after -Flonase nasal spray 1-2 sprays each nostril daily for nasal congestion/drainage  -You can try Afrin for 3-5 days as well to see if this helps open up your sinuses some more and help with taste and smell. Do not use longer than this  Increase CPAP usage to every night, minimum of 4-6 hours a night.  Change  equipment every 30 days or as directed by DME. Wash your tubing with warm soap and water daily, hang to dry. Wash humidifier portion weekly. Use bottled distilled water and change daily Be aware of reduced alertness and do not drive or operate heavy machinery if experiencing this or drowsiness.   Change to DreamWear full face mask Change CPAP settings to 8-20 cmH2O   Repeat CT chest in 4 weeks   Follow up in 5-6 weeks with Dr. [name] (1st) or Katie Karleen Seebeck,NP. If symptoms do not improve or worsen, please contact office for sooner follow up or seek emergency care.    Asthma exacerbation See above. Clinically improving. Action plan in place  Mild obstructive sleep apnea Mildly moderate OSA on CPAP. She has suboptimal compliance. Some of this seems to be due to mask and pressure problems. Dreamwear full face mask ordered and will adjust her settings to 8-20 cmH2O. May consider CPAP titration if she continues to have difficulties. Aware of proper care/use. Understands risks of untreated OSA. Cautioned on safe driving practices.   Allergic rhinitis Add on nasal decongestant and reduce use of intranasal steroid.    I spent 42 minutes of dedicated to the care of this patient on the date of this encounter to include pre-visit review of records, face-to-face time with the patient discussing conditions above, post visit ordering of testing, clinical documentation with the electronic health record, making appropriate referrals as documented, and communicating necessary findings to members of the patients care team.  Noemi Chapel, NP 02/01/2023  Pt aware and understands NP's role.

## 2023-02-01 NOTE — Assessment & Plan Note (Signed)
Clinically improving. Lung exam clear today. Infectious symptoms resolved. She will need repeat CT chest imaging in 4 weeks to assess for resolution. Continue bronchodilator regimen and mucociliary clearance therapies. Strict return precautions.  Patient Instructions  Continue Breztri 2 puffs Twice daily. Brush tongue and rinse mouth afterwards Continue Albuterol inhaler 2 puffs or 3 mL every 6 hours as needed for shortness of breath or wheezing. Notify if symptoms persist despite rescue inhaler/neb use. Continue levocetirizine 5 mg daily for allergies - new prescription sent to pharmacy    -Saline nasal rinses (Netti Pot or [employer]) 1-2 times a day. Use the flonase about 20-30 min after -Flonase nasal spray 1-2 sprays each nostril daily for nasal congestion/drainage  -You can try Afrin for 3-5 days as well to see if this helps open up your sinuses some more and help with taste and smell. Do not use longer than this  Increase CPAP usage to every night, minimum of 4-6 hours a night.  Change equipment every 30 days or as directed by DME. Wash your tubing with warm soap and water daily, hang to dry. Wash humidifier portion weekly. Use bottled distilled water and change daily Be aware of reduced alertness and do not drive or operate heavy machinery if experiencing this or drowsiness.   Change to DreamWear full face mask Change CPAP settings to 8-20 cmH2O   Repeat CT chest in 4 weeks   Follow up in 5-6 weeks with Dr. [name] (1st) or Katie Koty Anctil,NP. If symptoms do not improve or worsen, please contact office for sooner follow up or seek emergency care.

## 2023-02-01 NOTE — Assessment & Plan Note (Signed)
See above. Clinically improving. Action plan in place

## 2023-02-01 NOTE — Assessment & Plan Note (Signed)
Add on nasal decongestant and reduce use of intranasal steroid.

## 2023-02-01 NOTE — Patient Instructions (Signed)
Continue Breztri 2 puffs Twice daily. Brush tongue and rinse mouth afterwards Continue Albuterol inhaler 2 puffs or 3 mL every 6 hours as needed for shortness of breath or wheezing. Notify if symptoms persist despite rescue inhaler/neb use. Continue levocetirizine 5 mg daily for allergies - new prescription sent to pharmacy    -Saline nasal rinses (Netti Pot or [employer]) 1-2 times a day. Use the flonase about 20-30 min after -Flonase nasal spray 1-2 sprays each nostril daily for nasal congestion/drainage  -You can try Afrin for 3-5 days as well to see if this helps open up your sinuses some more and help with taste and smell. Do not use longer than this  Increase CPAP usage to every night, minimum of 4-6 hours a night.  Change equipment every 30 days or as directed by DME. Wash your tubing with warm soap and water daily, hang to dry. Wash humidifier portion weekly. Use bottled distilled water and change daily Be aware of reduced alertness and do not drive or operate heavy machinery if experiencing this or drowsiness.   Change to DreamWear full face mask Change CPAP settings to 8-20 cmH2O   Repeat CT chest in 4 weeks   Follow up in 5-6 weeks with Dr. [name] (1st) or Katie Ashely Goosby,NP. If symptoms do not improve or worsen, please contact office for sooner follow up or seek emergency care.

## 2023-02-17 ENCOUNTER — Emergency Department (HOSPITAL_COMMUNITY)
Admission: EM | Admit: 2023-02-17 | Discharge: 2023-02-17 | Disposition: A | Discharge status: Home / Self Care | Payer: 59 | Attending: Emergency Medicine | Admitting: Emergency Medicine

## 2023-02-17 ENCOUNTER — Other Ambulatory Visit: Payer: Self-pay

## 2023-02-17 ENCOUNTER — Encounter (HOSPITAL_COMMUNITY): Payer: Self-pay

## 2023-02-17 ENCOUNTER — Emergency Department (HOSPITAL_COMMUNITY): Payer: 59

## 2023-02-17 DIAGNOSIS — Z1152 Encounter for screening for COVID-19: Secondary | ICD-10-CM | POA: Diagnosis not present

## 2023-02-17 DIAGNOSIS — Z91010 Allergy to peanuts: Secondary | ICD-10-CM | POA: Insufficient documentation

## 2023-02-17 DIAGNOSIS — J029 Acute pharyngitis, unspecified: Secondary | ICD-10-CM | POA: Diagnosis not present

## 2023-02-17 DIAGNOSIS — R918 Other nonspecific abnormal finding of lung field: Secondary | ICD-10-CM | POA: Diagnosis not present

## 2023-02-17 DIAGNOSIS — E041 Nontoxic single thyroid nodule: Secondary | ICD-10-CM | POA: Diagnosis not present

## 2023-02-17 DIAGNOSIS — J0510 Acute epiglottitis without obstruction: Secondary | ICD-10-CM | POA: Diagnosis not present

## 2023-02-17 DIAGNOSIS — M7989 Other specified soft tissue disorders: Secondary | ICD-10-CM | POA: Insufficient documentation

## 2023-02-17 DIAGNOSIS — Z20822 Contact with and (suspected) exposure to covid-19: Secondary | ICD-10-CM | POA: Insufficient documentation

## 2023-02-17 LAB — RESP PANEL BY RT-PCR (RSV, FLU A&B, COVID)  RVPGX2
Influenza A by PCR: NEGATIVE
Influenza B by PCR: NEGATIVE
Resp Syncytial Virus by PCR: NEGATIVE
SARS Coronavirus 2 by RT PCR: NEGATIVE

## 2023-02-17 LAB — GROUP A STREP BY PCR: Group A Strep by PCR: NOT DETECTED

## 2023-02-17 NOTE — ED Triage Notes (Signed)
Woke up this morning and feels like there is something in her throat.   Denies fever and cough

## 2023-02-17 NOTE — Discharge Instructions (Addendum)
Warm salt water gargles.  Lozenges for your throat.

## 2023-02-17 NOTE — ED Notes (Signed)
Pt endorses feeling as if there is something stuck in her throat. Denies difficulty breathing at this time, states when laying down work of breathing is increased.   Pt also concerned with swelling to bilateral lower extremities. Has happened previously and was put on lasix. Has not taken any medications PTA.

## 2023-02-17 NOTE — ED Provider Notes (Signed)
[place] EMERGENCY DEPARTMENT AT [facility] Provider Note   CSN: [id number] Arrival date & time: 02/17/23  1356     History  Chief Complaint  Patient presents with   Sore Throat    [name] is a 52 y.o. female.  Reports she awoke this morning feeling like there was something in her throat.  Patient reports going to bed last night without using her CPAP.  Patient reports that she slept lying on her abdomen and when she awoke this morning she had discomfort in her throat.  Patient feels like something is moving up and down in her throat when she swallows.  Patient reports she has had a tonsillectomy in the past.  She denies any fever she denies any chills patient denies cough.  Patient reports she was recently hospitalized with pneumonia.  Patient has finished antibiotics she was on steroids and has finished the steroids.  Patient reports she has had increased swelling in her lower extremities today.  Patient reports the swelling has decreased with elevating her legs.  The history is provided by the patient. No language interpreter was used.  Sore Throat This is a new problem. The current episode started 6 to 12 hours ago. The problem occurs constantly. The problem has been gradually improving. Pertinent negatives include no chest pain and no shortness of breath. Nothing aggravates the symptoms. Nothing relieves the symptoms. She has tried nothing for the symptoms.       Home Medications Prior to Admission medications   Medication Sig Start Date End Date Taking? Authorizing Provider  acetaminophen (TYLENOL) 325 MG tablet Take 2 tablets (650 mg total) by mouth every 6 (six) hours as needed for mild pain (or Fever >/= 101). Patient not taking: Reported on 02/01/2023 12/30/22   Shon Hale, MD  albuterol (PROVENTIL) (2.5 MG/3ML) 0.083% nebulizer solution Take 3 mLs (2.5 mg total) by nebulization every 4 (four) hours as needed for wheezing or shortness of breath.  12/30/22 12/30/23  Shon Hale, MD  albuterol (VENTOLIN HFA) 108 (90 Base) MCG/ACT inhaler Inhale 2 puffs into the lungs every 4 (four) hours as needed for wheezing or shortness of breath. 12/30/22   Shon Hale, MD  allopurinol (ZYLOPRIM) 300 MG tablet Take 300 mg by mouth daily. 05/11/22   [provider]  ALPRAZolam Prudy Feeler) 0.5 MG tablet Take 0.5 mg by mouth 2 (two) times daily as needed for anxiety. 06/29/19   [provider]  atorvastatin (LIPITOR) 40 MG tablet Take 40 mg by mouth daily.    [provider]  benzonatate (TESSALON) 200 MG capsule Take 1 capsule (200 mg total) by mouth 3 (three) times daily as needed for cough. 12/30/22   Shon Hale, MD  Budeson-Glycopyrrol-Formoterol (BREZTRI AEROSPHERE) 160-9-4.8 MCG/ACT AERO Inhale 2 puffs into the lungs in the morning and at bedtime. 12/30/22   Shon Hale, MD  cholecalciferol (VITAMIN D3) 25 MCG (1000 UNIT) tablet Take 4,000 Units by mouth daily.    [provider]  colchicine 0.6 MG tablet Take 0.6 mg by mouth daily. 05/11/22   [provider]  diclofenac Sodium (VOLTAREN) 1 % GEL Apply 1 Application topically 4 (four) times daily. 10/10/22   [provider]  DULoxetine (CYMBALTA) 60 MG capsule Take 60 mg by mouth daily. 06/29/19   [provider]  folic acid (FOLVITE) 1 MG tablet Take 1 mg by mouth daily.    [provider]  furosemide (LASIX) 20 MG tablet Take 1 tablet (20 mg total)  by mouth daily. 07/15/22   Omar Person, MD  HYDROcodone-acetaminophen (NORCO) 5-325 MG tablet Take 1 tablet by mouth every 6 (six) hours as needed for moderate pain. Patient not taking: Reported on 02/01/2023 12/25/22   Carmel Sacramento A, PA-C  hydrOXYzine (ATARAX) 50 MG tablet Take 0.5 tablets (25 mg total) by mouth 2 (two) times daily as needed for itching or anxiety. 12/30/22   Emokpae, Courage, MD  ipratropium-albuterol (DUONEB) 0.5-2.5 (3) MG/3ML SOLN Take 3 mLs by  nebulization every 6 (six) hours as needed. 12/30/22   Emokpae, Courage, MD  LATUDA 40 MG TABS tablet Take 40 mg by mouth at bedtime. 06/29/19   [provider]  levocetirizine (XYZAL) 5 MG tablet Take 1 tablet (5 mg total) by mouth daily. 10/10/22   Cobb, Ruby Cola, NP  metFORMIN (GLUCOPHAGE) 500 MG tablet Take 1 tablet (500 mg total) by mouth daily with breakfast. 12/30/22   Shon Hale, MD  methotrexate 2.5 MG tablet Take 25 mg by mouth once a week. Caution:Chemotherapy. Protect from light.  Takes 10 per day once a week. Patient not taking: Reported on 02/01/2023    [provider]  mirabegron ER (MYRBETRIQ) 25 MG TB24 tablet Take 25 mg by mouth daily.    [provider]  Oxcarbazepine (TRILEPTAL) 300 MG tablet Take 1 tablet (300 mg total) by mouth 2 (two) times daily. Patient taking differently: Take 600 mg by mouth at bedtime. 11/07/13   Myrlene Broker, MD  pantoprazole (PROTONIX) 40 MG tablet Take 1 tablet (40 mg total) by mouth daily. 12/30/22 02/28/23  Shon Hale, MD  prazosin (MINIPRESS) 5 MG capsule Take 5 mg by mouth 2 (two) times daily.    [provider]  predniSONE (DELTASONE) 10 MG tablet Take 1 tablet (10 mg total) by mouth See admin instructions. Take 4 tablets (40 mg) daily x 4 days, then 3 tab (30 mg) daily for 3 days, then 2 Tab (20 mg) daily for 3 days, then 1 tab (10 mg) daily for 3 days 12/30/22   Shon Hale, MD  pregabalin (LYRICA) 75 MG capsule Take 1 capsule by mouth daily. 06/29/19   [provider]  promethazine (PHENERGAN) 25 MG tablet Take 25 mg by mouth every 6 (six) hours as needed. 12/09/22   [provider]  vitamin B-12 (CYANOCOBALAMIN) 100 MCG tablet Take 100 mcg by mouth daily.    [provider]      Allergies    Peanut-containing drug products, Tegretol [carbamazepine], Beef allergy, Lithium, Pork-derived products, Sweet potato, and Penicillins    Review of Systems   Review of  Systems  Respiratory:  Negative for shortness of breath.   Cardiovascular:  Negative for chest pain.  All other systems reviewed and are negative.   Physical Exam Updated Vital Signs BP 122/68 (BP Location: Right Arm)   Pulse 81   Temp 98.7 F (37.1 C) (Oral)   Resp 18   Ht 5\' 7"  (1.702 m)   Wt 112.9 kg   SpO2 97%   BMI 39.00 kg/m  Physical Exam Vitals and nursing note reviewed.  Constitutional:      Appearance: She is well-developed.  HENT:     Head: Normocephalic.     Mouth/Throat:     Mouth: Mucous membranes are moist.     Pharynx: No pharyngeal swelling or posterior oropharyngeal erythema.     Comments: Uvula is midline tonsils are absent Pulmonary:     Effort: Pulmonary effort is normal.  Abdominal:  General: There is no distension.  Musculoskeletal:        General: Normal range of motion.     Cervical back: Normal range of motion.  Neurological:     Mental Status: She is alert and oriented to person, place, and time.     ED Results / Procedures / Treatments   Labs (all labs ordered are listed, but only abnormal results are displayed) Labs Reviewed  GROUP A STREP BY PCR  RESP PANEL BY RT-PCR (RSV, FLU A&B, COVID)  [id number]    EKG None  Radiology CT SOFT TISSUE NECK WO CONTRAST  Result Date: 02/17/2023 CLINICAL DATA:  Epiglottitis. Vocal this morning and feels like there is something in her throat EXAM: CT NECK WITHOUT CONTRAST TECHNIQUE: Multidetector CT imaging of the neck was performed following the standard protocol without intravenous contrast. RADIATION DOSE REDUCTION: This exam was performed according to the departmental dose-optimization program which includes automated exposure control, adjustment of the mA and/or kV according to patient size and/or use of iterative reconstruction technique. COMPARISON:  CT Chest 12/28/22 FINDINGS: Pharynx and larynx: The epiglottis is normal in appearance. Bilateral tonsils are normal in appearance. Salivary glands:  No inflammation, mass, or stone. Thyroid: 1.6 cm right thyroid nodule (series 2, image 74). Recommend further evaluation with a nonemergent thyroid ultrasound. Lymph nodes: Prominent level 1A lymph node measuring up to 1.6 cm, likely reactive Vascular: Lack of IV contrast precludes assessment of the neck vasculature Limited intracranial: Negative. Visualized orbits: Negative. Mastoids and visualized paranasal sinuses: No middle ear or mastoid effusion. Paranasal sinuses are for polypoid mucosal thickening in the left sphenoid sinus. Orbits are unremarkable. Skeleton: Straightening of the normal cervical lordosis. Upper chest: Apparent clustered nodularity in the right upper lobe. This is likely unchanged compared to 12/28/2022 CT of the chest, but assessment is limited due to the degree of respiratory motion artifact. Other: None. IMPRESSION: Assessment is limited due to the absence of IV contrast and motion artifact. Within this limitation 1. No acute abnormality in the neck. Specifically, no evidence of epiglottitis. 2. 1.6 cm right thyroid nodule. Recommend further evaluation with a nonemergent thyroid ultrasound. Electronically Signed   By: Lorenza Cambridge M.D.   On: 02/17/2023 20:47   DG Chest 2 View  Result Date: 02/17/2023 CLINICAL DATA:  Sore throat. EXAM: CHEST - 2 VIEW COMPARISON:  X-ray and CT angiogram 12/28/2022 FINDINGS: Underinflation. No pneumothorax, effusion or edema. Stable cardiopericardial silhouette. Slight hazy lung base opacity identified. Lateral view is under penetrated IMPRESSION: Underinflation. Slight hazy opacity at the lung bases. No consolidation or effusion. Electronically Signed   By: Karen Kays M.D.   On: 02/17/2023 15:19    Procedures Procedures    Medications Ordered in ED Medications - No data to display  ED Course/ Medical Decision Making/ A&P                                 Medical Decision Making Patient complains of feeling like there is something in her  throat that is obstructing her.  Patient reports she has been able to eat and drink without difficulty.  Patient reports the symptoms are improving since this a.m.  Amount and/or Complexity of Data Reviewed Labs: ordered. Decision-making details documented in ED Course.    Details: COVID is negative strep is negative Radiology: ordered and independent interpretation performed. Decision-making details documented in ED Course.    Details: Chest x-ray shows slight  hazy appearance no acute abnormality CT soft tissue neck shows 1.6 cm right thyroid nodule.  Radiologist recommended follow-up ultrasound  Risk Risk Details: Patient advised of thyroid nodule she is advised she will need to follow-up with Dr. Sherryll Burger to schedule outpatient thyroid ultrasound.  Patient is advised to use her CPAP machine tonight..  Patient advised lozenges warm salt water gargles to help with symptoms.           Final Clinical Impression(s) / ED Diagnoses Final diagnoses:  Thyroid nodule  Pharyngitis, unspecified etiology    Rx / DC Orders ED Discharge Orders     None     An After Visit Summary was printed and given to the patient.     Elson Areas, [place] 02/17/23 2108    Sloan Leiter, DO 02/21/23 [place]

## 2023-02-17 NOTE — ED Notes (Signed)
Patient transported to CT 

## 2023-02-23 DIAGNOSIS — S40029A Contusion of unspecified upper arm, initial encounter: Secondary | ICD-10-CM | POA: Diagnosis not present

## 2023-02-23 DIAGNOSIS — L819 Disorder of pigmentation, unspecified: Secondary | ICD-10-CM | POA: Diagnosis not present

## 2023-02-24 DIAGNOSIS — E1165 Type 2 diabetes mellitus with hyperglycemia: Secondary | ICD-10-CM | POA: Diagnosis not present

## 2023-02-24 DIAGNOSIS — Z299 Encounter for prophylactic measures, unspecified: Secondary | ICD-10-CM | POA: Diagnosis not present

## 2023-02-24 DIAGNOSIS — E1142 Type 2 diabetes mellitus with diabetic polyneuropathy: Secondary | ICD-10-CM | POA: Diagnosis not present

## 2023-02-24 DIAGNOSIS — E041 Nontoxic single thyroid nodule: Secondary | ICD-10-CM | POA: Diagnosis not present

## 2023-02-24 DIAGNOSIS — S5000XA Contusion of unspecified elbow, initial encounter: Secondary | ICD-10-CM | POA: Diagnosis not present

## 2023-02-25 LAB — TSH
TSH: 2.29 (ref 0.41–5.90)
TSH: 2.29 (ref 0.41–5.90)

## 2023-02-27 DIAGNOSIS — E042 Nontoxic multinodular goiter: Secondary | ICD-10-CM | POA: Diagnosis not present

## 2023-02-28 ENCOUNTER — Encounter: Payer: Self-pay | Admitting: Internal Medicine

## 2023-02-28 ENCOUNTER — Other Ambulatory Visit: Payer: Self-pay | Admitting: Internal Medicine

## 2023-02-28 DIAGNOSIS — Z1231 Encounter for screening mammogram for malignant neoplasm of breast: Secondary | ICD-10-CM

## 2023-03-09 ENCOUNTER — Encounter: Payer: Self-pay | Admitting: Nurse Practitioner

## 2023-03-09 ENCOUNTER — Ambulatory Visit (INDEPENDENT_AMBULATORY_CARE_PROVIDER_SITE_OTHER): Payer: 59 | Admitting: Nurse Practitioner

## 2023-03-09 VITALS — BP 116/78 | HR 100 | Ht 67.5 in | Wt 254.4 lb

## 2023-03-09 DIAGNOSIS — J4540 Moderate persistent asthma, uncomplicated: Secondary | ICD-10-CM | POA: Diagnosis not present

## 2023-03-09 DIAGNOSIS — J4550 Severe persistent asthma, uncomplicated: Secondary | ICD-10-CM | POA: Diagnosis not present

## 2023-03-09 DIAGNOSIS — J309 Allergic rhinitis, unspecified: Secondary | ICD-10-CM | POA: Diagnosis not present

## 2023-03-09 DIAGNOSIS — J189 Pneumonia, unspecified organism: Secondary | ICD-10-CM

## 2023-03-09 DIAGNOSIS — G4733 Obstructive sleep apnea (adult) (pediatric): Secondary | ICD-10-CM

## 2023-03-09 DIAGNOSIS — M069 Rheumatoid arthritis, unspecified: Secondary | ICD-10-CM

## 2023-03-09 MED ORDER — MONTELUKAST SODIUM 10 MG PO TABS
10.0000 mg | ORAL_TABLET | Freq: Every day | ORAL | 11 refills | Status: DC
Start: 2023-03-09 — End: 2024-02-14

## 2023-03-09 NOTE — Assessment & Plan Note (Signed)
Follow up CT chest ordered to ensure resolution. Clinically improved.

## 2023-03-09 NOTE — Assessment & Plan Note (Addendum)
Mildly moderate OSA on CPAP. She has improved compliance and is now utilizing machine 83% >4 hr with excellent control. She receives benefit from use. She does still have trouble with her mask. Would like to try an alternative nasal mask. Understands proper use/care of device. Aware of safe driving practices. Will reach out to [employer] to see what they need

## 2023-03-09 NOTE — Patient Instructions (Addendum)
Continue Breztri 2 puffs Twice daily. Brush tongue and rinse mouth afterwards Continue Albuterol inhaler 2 puffs or 3 mL every 6 hours as needed for shortness of breath or wheezing. Notify if symptoms persist despite rescue inhaler/neb use. Continue levocetirizine 5 mg daily for allergies - new prescription sent to pharmacy  Continue Saline nasal rinses (Netti Pot or [employer]) 1-2 times a day. Use the flonase about 20-30 min after Continue Flonase nasal spray 1-2 sprays each nostril daily for nasal congestion/drainage   Start singulair 1 tab At bedtime for allergies/asthma  Continue CPAP usage to every night, minimum of 4-6 hours a night.  Change equipment as directed. Wash your tubing with warm soap and water daily, hang to dry. Wash humidifier portion weekly. Use bottled distilled water and change daily Be aware of reduced alertness and do not drive or operate heavy machinery if experiencing this or drowsiness.    Change to AirFit N20 mask   Repeat CT chest ordered - someone will contact you for scheduling    Follow up in 6-8 weeks with Dr. [name] (1st) or Katie Toribio Seiber,NP. If symptoms do not improve or worsen, please contact office for sooner follow up or seek emergency care.

## 2023-03-09 NOTE — Assessment & Plan Note (Signed)
Completed prednisone for flare up. She's on methotrexate. Follow up with rheumatology as scheduled.

## 2023-03-09 NOTE — Assessment & Plan Note (Signed)
Clinically improved. Compensated on current regimen. She does have some more difficulties when outdoors or with allergies flaring. We will start her on singulair. Medication education/side effect profile reviewed. Continue triple therapy regimen. Action plan in place. Encouraged to work on graded exercises and breathing techniques.   Patient Instructions  Continue Breztri 2 puffs Twice daily. Brush tongue and rinse mouth afterwards Continue Albuterol inhaler 2 puffs or 3 mL every 6 hours as needed for shortness of breath or wheezing. Notify if symptoms persist despite rescue inhaler/neb use. Continue levocetirizine 5 mg daily for allergies - new prescription sent to pharmacy  Continue Saline nasal rinses (Netti Pot or [employer]) 1-2 times a day. Use the flonase about 20-30 min after Continue Flonase nasal spray 1-2 sprays each nostril daily for nasal congestion/drainage   Start singulair 1 tab At bedtime for allergies/asthma  Continue CPAP usage to every night, minimum of 4-6 hours a night.  Change equipment as directed. Wash your tubing with warm soap and water daily, hang to dry. Wash humidifier portion weekly. Use bottled distilled water and change daily Be aware of reduced alertness and do not drive or operate heavy machinery if experiencing this or drowsiness.    Change to AirFit N20 mask   Repeat CT chest ordered - someone will contact you for scheduling    Follow up in 6-8 weeks with Dr. [name] (1st) or Katie Hadlea Furuya,NP. If symptoms do not improve or worsen, please contact office for sooner follow up or seek emergency care.

## 2023-03-09 NOTE — Assessment & Plan Note (Signed)
Add on singulair. Continue intranasal steroid and daily antihistamine.

## 2023-03-09 NOTE — Progress Notes (Signed)
@Patient  ID: [name], female    DOB: [birth year], 52 y.o.   MRN: [id number]  Chief Complaint  Patient presents with   Follow-up    Pt is here for OSA F/U visit. Pt complains of SOB.    Referring provider: Kirstie Peri, MD  HPI: 52 year old patient, never smoker followed for asthma and OSA. She is a patient of [name] and last seen 02/01/2023 by [name] NP. Past medical history significant for GERD, RA on methotrexate, bipolar, depression.   TESTS/EVENTS: 10/06/2022 PFT: FVC 65, FEV1 68, ratio 88, TLC 74, DLCOcor 72 12/28/2022 CTA chest: No PE.  Moderate sized hiatal hernia, stable.  Borderline sized bilateral hilar and mediastinal lymph nodes.  1 cm low-density lesion in the right thyroid lobe, smaller than prior study.  Parabronchial thickening.  Nodular groundglass opacities throughout the right lung, most pronounced in right lower lobe and posterior right upper lobe.  Few scattered groundglass nodularities in the left lung. Likely infectious/inflammatory  06/28/2022: OV with Dr. [name] for hospital follow up. She was admitted 11/6-11/8 for asthma exacerbation due to RSV infection. She had been having DOE since    10/10/2022: Ov with Nivea Wojdyla NP for follow up after undergoing PFTs and HST. She was found to have a restrictive defect and minimal diffusion defect on her pulmonary function testing. Her home sleep study revealed mildly moderate OSA. She has never been treated for this with CPAP before.  Today, she tells me that she was doing much better after she started the [facility] inhaler. Over the past few days (starting approx 3/21), she developed sinus congestion, sneezing, watery eyes. She thought it was allergies. She now has a cough and is feeling like her chest is a little tighter, especially when she lays back. She also notices more wheezing at night. She feels like she gets more short winded with activities around the house but doesn't feel like her shortness of breath is severe.  She denies any fevers, chills, hemoptysis, leg swelling. She is using her Breztri twice a day. Increased use of albuterol over the past few days. She is not currently on anything for allergies.  Regarding her sleep, she feels very tired during the day. She snores at night. She denies any drowsy driving or morning headaches. She would like to know what treatment options there are.   02/01/2023: OV with Latesa Fratto NP for follow up.  She was recently hospitalized from 12/28/2022-12/30/2022 for pneumonia treated with cefdinir and azithromycin.  Prior to this she had been seen in the emergency department 12/25/2022 for myalgias/arthralgias.  She had, had tick bites prior to this and had been on doxycycline.  She was provided with Norco and discharged home.  She also had viral testing that was negative. She tells me today that she is finally starting to feel better.  She is still having some more shortness of breath compared to her baseline and she does not feel like her stamina is quite back to where it was before.  She does still have an occasional cough but it is primarily dry.  Feels like it is improving.  She has not noticed quite as much wheezing.  Denies any fevers, chills, hemoptysis.  Myalgias and arthralgias are better as well.  She is still using her nebs twice a day.  She is been able to back off from 4 times a day.  Still on Breztri twice a day.  Is having difficulties with her taste and smell.  Does not feel  like she can taste much of anything and she is not able to smell her candles like she usually is.  She did test negative for COVID so she is not sure why this is.  Does have some sinus symptoms which do not feel much worse than her baseline.  She was using Flonase a few times a day but then it made her nosebleed. She has been having trouble with her CPAP due to not feeling like she is getting enough air.  She says that she thought this was related to recovering from pneumonia but then she also thinks that it  might be a mass problem.  She is used a couple different masks.  Currently settled on nasal pillow which she still does not always feel it gives her enough air at times.  She tends to have to open her mouth to breathe more easily.  She denies any drowsy driving or morning headaches. 01/01/2023-01/30/2023: CPAP auto 5-15 cmH2O 13/30 days; 17% > 4 hours; average use 3 hours 28 minutes Pressure 95th 10.1 Leaks 95th 8.2 AHI 1.9  03/09/2023: Today - follow up Patient presents today for follow-up.  Has been doing better since she was here last.  She does still have some occasional shortness of breath and sometimes feels like her breath catches.  Notices it more so when she is outside and it is hot.  Cough is mostly gone.  Not noticing any wheezing or chest congestion.  She did have an RA flare and is currently on prednisone for this.  Denies any fevers, chills, hemoptysis, lower extremity swelling, orthopnea.  She is on Breztri twice a day.  Taste and smell is getting better.  She has been using Flonase, which helped her sinus symptoms but she does occasionally still have some postnasal drainage.  She feels like this is primarily her allergies that bother her.  She is taking a daily allergy pill which does help. She feels like she has been doing better with her CPAP.  Wearing it most nights.  Occasionally will wake up with it off.  She has been getting calls from [employer] about not wearing her CPAP enough.  She is confused about this because she has been wearing it more consistently.  She was in the hospital before so she is not sure if this is the compliance problem they are talking about.  Denies any drowsy driving or morning headaches.  She does receive benefit from using her CPAP.  02/07/2023-03/08/2023: CPAP 8-20 cmH2O 29/30 days; 83% >4 hr; average use 6 hr 28 min Pressure 95th 15 Leaks 12.8 AHI 2.3   Allergies  Allergen Reactions   Peanut-Containing Drug Products Anaphylaxis    Swelling    Tegretol  [Carbamazepine] Shortness Of Breath and Swelling   Beef Allergy     Does not eat red meat   Lithium Other (See Comments)    Bad acne    Pork-Derived Products Other (See Comments)    Does not eat pork products   Sweet Potato Itching   Penicillins Rash    There is no immunization history for the selected administration types on file for this patient.  Past Medical History:  Diagnosis Date   Anemia    Anxiety    Arthritis    orthopedic   Bipolar disorder (HCC)    Depression    Fibromyalgia    GERD (gastroesophageal reflux disease)    Hypotension    IBS (irritable bowel syndrome)    Migraine    PTSD (  post-traumatic stress disorder)    on prazosin   Sleep apnea    Tonsillar hypertrophy    Uterine fibroid     Tobacco History: Social History   Tobacco Use  Smoking Status Never  Smokeless Tobacco Never   Counseling given: Not Answered   Outpatient Medications Prior to Visit  Medication Sig Dispense Refill   acetaminophen (TYLENOL) 325 MG tablet Take 2 tablets (650 mg total) by mouth every 6 (six) hours as needed for mild pain (or Fever >/= 101). (Patient not taking: Reported on 02/01/2023) 100 tablet 0   albuterol (PROVENTIL) (2.5 MG/3ML) 0.083% nebulizer solution Take 3 mLs (2.5 mg total) by nebulization every 4 (four) hours as needed for wheezing or shortness of breath. 75 mL 2   albuterol (VENTOLIN HFA) 108 (90 Base) MCG/ACT inhaler Inhale 2 puffs into the lungs every 4 (four) hours as needed for wheezing or shortness of breath. 18 g 2   allopurinol (ZYLOPRIM) 300 MG tablet Take 300 mg by mouth daily.     ALPRAZolam (XANAX) 0.5 MG tablet Take 0.5 mg by mouth 2 (two) times daily as needed for anxiety.     atorvastatin (LIPITOR) 40 MG tablet Take 40 mg by mouth daily.     Budeson-Glycopyrrol-Formoterol (BREZTRI AEROSPHERE) 160-9-4.8 MCG/ACT AERO Inhale 2 puffs into the lungs in the morning and at bedtime. 10.7 g 3   cholecalciferol (VITAMIN D3) 25 MCG (1000 UNIT) tablet  Take 4,000 Units by mouth daily.     colchicine 0.6 MG tablet Take 0.6 mg by mouth daily.     diclofenac Sodium (VOLTAREN) 1 % GEL Apply 1 Application topically 4 (four) times daily.     DULoxetine (CYMBALTA) 60 MG capsule Take 60 mg by mouth daily.     folic acid (FOLVITE) 1 MG tablet Take 1 mg by mouth daily.     furosemide (LASIX) 20 MG tablet Take 1 tablet (20 mg total) by mouth daily. 30 tablet 1   HYDROcodone-acetaminophen (NORCO) 5-325 MG tablet Take 1 tablet by mouth every 6 (six) hours as needed for moderate pain. (Patient not taking: Reported on 02/01/2023) 6 tablet 0   hydrOXYzine (ATARAX) 50 MG tablet Take 0.5 tablets (25 mg total) by mouth 2 (two) times daily as needed for itching or anxiety. 30 tablet 3   ipratropium-albuterol (DUONEB) 0.5-2.5 (3) MG/3ML SOLN Take 3 mLs by nebulization every 6 (six) hours as needed. 360 mL 3   LATUDA 40 MG TABS tablet Take 40 mg by mouth at bedtime.     levocetirizine (XYZAL) 5 MG tablet Take 1 tablet (5 mg total) by mouth daily. 30 tablet 5   metFORMIN (GLUCOPHAGE) 500 MG tablet Take 1 tablet (500 mg total) by mouth daily with breakfast. 90 tablet 3   methotrexate 2.5 MG tablet Take 25 mg by mouth once a week. Caution:Chemotherapy. Protect from light.  Takes 10 per day once a week. (Patient not taking: Reported on 02/01/2023)     mirabegron ER (MYRBETRIQ) 25 MG TB24 tablet Take 25 mg by mouth daily.     Oxcarbazepine (TRILEPTAL) 300 MG tablet Take 1 tablet (300 mg total) by mouth 2 (two) times daily. (Patient taking differently: Take 600 mg by mouth at bedtime.) 60 tablet 2   pantoprazole (PROTONIX) 40 MG tablet Take 1 tablet (40 mg total) by mouth daily. 30 tablet 1   prazosin (MINIPRESS) 5 MG capsule Take 5 mg by mouth 3 (three) times daily. @ Bedtime     pregabalin (LYRICA) 75 MG  capsule Take 1 capsule by mouth daily.     promethazine (PHENERGAN) 25 MG tablet Take 25 mg by mouth every 6 (six) hours as needed.     vitamin B-12 (CYANOCOBALAMIN) 100  MCG tablet Take 100 mcg by mouth daily.     benzonatate (TESSALON) 200 MG capsule Take 1 capsule (200 mg total) by mouth 3 (three) times daily as needed for cough. 20 capsule 0   predniSONE (DELTASONE) 10 MG tablet Take 1 tablet (10 mg total) by mouth See admin instructions. Take 4 tablets (40 mg) daily x 4 days, then 3 tab (30 mg) daily for 3 days, then 2 Tab (20 mg) daily for 3 days, then 1 tab (10 mg) daily for 3 days 40 tablet 0   No facility-administered medications prior to visit.     Review of Systems:   Constitutional: No weight loss or gain, night sweats, fevers, chills, lassitude. +fatigue,  HEENT: No headaches, difficulty swallowing, tooth/dental problems, or sore throat. No sneezing, itching, ear ache. +nasal congestion, loss of taste and smell (improving) CV:  No chest pain, orthopnea, PND, swelling in lower extremities, anasarca, dizziness, palpitations, syncope Resp: +shortness of breath with exertion. No excess mucus or change in color of mucus.  No hemoptysis. No wheezing.  No chest wall deformity GI:  No heartburn, indigestion GU: No dysuria, change in color of urine, urgency or frequency.   Skin: No rash, lesions, ulcerations MSK:  No increased joint pain or swelling.   Neuro: No dizziness or lightheadedness.  Psych: No depression or anxiety. Mood stable.     Physical Exam:  BP 116/78 (BP Location: Right Arm, Cuff Size: Large)   Pulse 100   Ht 5' 7.5" (1.715 m)   Wt 254 lb 6.4 oz (115.4 kg)   SpO2 99%   BMI 39.26 kg/m   GEN: Pleasant, interactive, well-appearing; obese; in no acute distress. HEENT:  Normocephalic and atraumatic. PERRLA. Sclera white. Nasal turbinates pink, moist and patent bilaterally. No rhinorrhea present. Oropharynx pink and moist, without exudate or edema. No lesions, ulcerations, or postnasal drip.  NECK:  Supple w/ fair ROM. No JVD present. Normal carotid impulses w/o bruits. Thyroid symmetrical with no goiter or nodules palpated. No  lymphadenopathy.   CV: RRR, no m/r/g, no peripheral edema. Pulses intact, +2 bilaterally. No cyanosis, pallor or clubbing. PULMONARY:  Unlabored, regular breathing. Clear bilaterally A&P w/o wheezes/rales/rhonchi. No accessory muscle use.  GI: BS present and normoactive. Soft, non-tender to palpation. No organomegaly or masses detected.  MSK: No erythema, warmth or tenderness. Cap refil <2 sec all extrem. No deformities or joint swelling noted.  Neuro: A/Ox3. No focal deficits noted.   Skin: Warm, no lesions or rashe Psych: Normal affect and behavior. Judgement and thought content appropriate.     Lab Results:  CBC    Component Value Date/Time   WBC 10.6 (H) 12/30/2022 0438   RBC 3.79 (L) 12/30/2022 0438   HGB 11.0 (L) 12/30/2022 0438   HCT 34.4 (L) 12/30/2022 0438   PLT 281 12/30/2022 0438   MCV 90.8 12/30/2022 0438   MCH 29.0 12/30/2022 0438   MCHC 32.0 12/30/2022 0438   RDW 16.0 (H) 12/30/2022 0438   LYMPHSABS 1.7 12/29/2022 0554   MONOABS 1.3 (H) 12/29/2022 0554   EOSABS 0.0 12/29/2022 0554   BASOSABS 0.1 12/29/2022 0554    BMET    Component Value Date/Time   NA 133 (L) 12/30/2022 0438   K 4.5 12/30/2022 0438   CL 102 12/30/2022 0438  CO2 23 12/30/2022 0438   GLUCOSE 196 (H) 12/30/2022 0438   BUN 13 12/30/2022 0438   CREATININE 0.68 12/30/2022 0438   CALCIUM 9.4 12/30/2022 0438   GFRNONAA >60 12/30/2022 0438   GFRAA >60 03/29/2020 1009    BNP    Component Value Date/Time   BNP 13.0 12/28/2022 2019     Imaging:  CT SOFT TISSUE NECK WO CONTRAST  Result Date: 02/17/2023 CLINICAL DATA:  Epiglottitis. Vocal this morning and feels like there is something in her throat EXAM: CT NECK WITHOUT CONTRAST TECHNIQUE: Multidetector CT imaging of the neck was performed following the standard protocol without intravenous contrast. RADIATION DOSE REDUCTION: This exam was performed according to the departmental dose-optimization program which includes automated exposure  control, adjustment of the mA and/or kV according to patient size and/or use of iterative reconstruction technique. COMPARISON:  CT Chest 12/28/22 FINDINGS: Pharynx and larynx: The epiglottis is normal in appearance. Bilateral tonsils are normal in appearance. Salivary glands: No inflammation, mass, or stone. Thyroid: 1.6 cm right thyroid nodule (series 2, image 74). Recommend further evaluation with a nonemergent thyroid ultrasound. Lymph nodes: Prominent level 1A lymph node measuring up to 1.6 cm, likely reactive Vascular: Lack of IV contrast precludes assessment of the neck vasculature Limited intracranial: Negative. Visualized orbits: Negative. Mastoids and visualized paranasal sinuses: No middle ear or mastoid effusion. Paranasal sinuses are for polypoid mucosal thickening in the left sphenoid sinus. Orbits are unremarkable. Skeleton: Straightening of the normal cervical lordosis. Upper chest: Apparent clustered nodularity in the right upper lobe. This is likely unchanged compared to 12/28/2022 CT of the chest, but assessment is limited due to the degree of respiratory motion artifact. Other: None. IMPRESSION: Assessment is limited due to the absence of IV contrast and motion artifact. Within this limitation 1. No acute abnormality in the neck. Specifically, no evidence of epiglottitis. 2. 1.6 cm right thyroid nodule. Recommend further evaluation with a nonemergent thyroid ultrasound. Electronically Signed   By: Lorenza Cambridge M.D.   On: 02/17/2023 20:47   DG Chest 2 View  Result Date: 02/17/2023 CLINICAL DATA:  Sore throat. EXAM: CHEST - 2 VIEW COMPARISON:  X-ray and CT angiogram 12/28/2022 FINDINGS: Underinflation. No pneumothorax, effusion or edema. Stable cardiopericardial silhouette. Slight hazy lung base opacity identified. Lateral view is under penetrated IMPRESSION: Underinflation. Slight hazy opacity at the lung bases. No consolidation or effusion. Electronically Signed   By: Karen Kays M.D.   On:  02/17/2023 15:19    Administration History     None          Latest Ref Rng & Units 10/06/2022    2:23 PM  PFT Results  FVC-Pre L 2.63   FVC-Predicted Pre % 65   FVC-Post L 2.64   FVC-Predicted Post % 65   Pre FEV1/FVC % % 84   Post FEV1/FCV % % 88   FEV1-Pre L 2.20   FEV1-Predicted Pre % 68   FEV1-Post L 2.33   DLCO uncorrected ml/min/mmHg 17.40   DLCO UNC% % 72   DLCO corrected ml/min/mmHg 17.40   DLCO COR %Predicted % 72   DLVA Predicted % 116   TLC L 4.18   TLC % Predicted % 74   RV % Predicted % 76     No results found for: "NITRICOXIDE"      Assessment & Plan:   Severe persistent asthma Clinically improved. Compensated on current regimen. She does have some more difficulties when outdoors or with allergies flaring. We will start  her on singulair. Medication education/side effect profile reviewed. Continue triple therapy regimen. Action plan in place. Encouraged to work on graded exercises and breathing techniques.   Patient Instructions  Continue Breztri 2 puffs Twice daily. Brush tongue and rinse mouth afterwards Continue Albuterol inhaler 2 puffs or 3 mL every 6 hours as needed for shortness of breath or wheezing. Notify if symptoms persist despite rescue inhaler/neb use. Continue levocetirizine 5 mg daily for allergies - new prescription sent to pharmacy  Continue Saline nasal rinses (Netti Pot or [employer]) 1-2 times a day. Use the flonase about 20-30 min after Continue Flonase nasal spray 1-2 sprays each nostril daily for nasal congestion/drainage   Start singulair 1 tab At bedtime for allergies/asthma  Continue CPAP usage to every night, minimum of 4-6 hours a night.  Change equipment as directed. Wash your tubing with warm soap and water daily, hang to dry. Wash humidifier portion weekly. Use bottled distilled water and change daily Be aware of reduced alertness and do not drive or operate heavy machinery if experiencing this or drowsiness.     Change to AirFit N20 mask   Repeat CT chest ordered - someone will contact you for scheduling    Follow up in 6-8 weeks with Dr. [name] (1st) or Katie Quiara Killian,NP. If symptoms do not improve or worsen, please contact office for sooner follow up or seek emergency care.    CAP (community acquired pneumonia) Follow up CT chest ordered to ensure resolution. Clinically improved.   Allergic rhinitis Add on singulair. Continue intranasal steroid and daily antihistamine.   Mild obstructive sleep apnea Mildly moderate OSA on CPAP. She has improved compliance and is now utilizing machine 83% >4 hr with excellent control. She receives benefit from use. She does still have trouble with her mask. Would like to try an alternative nasal mask. Understands proper use/care of device. Aware of safe driving practices. Will reach out to [employer] to see what they need   Rheumatoid arthritis (HCC) Completed prednisone for flare up. She's on methotrexate. Follow up with rheumatology as scheduled.     I spent 35 minutes of dedicated to the care of this patient on the date of this encounter to include pre-visit review of records, face-to-face time with the patient discussing conditions above, post visit ordering of testing, clinical documentation with the electronic health record, making appropriate referrals as documented, and communicating necessary findings to members of the patients care team.  Noemi Chapel, NP 03/09/2023  Pt aware and understands NP's role.

## 2023-03-10 ENCOUNTER — Telehealth: Payer: Self-pay

## 2023-03-10 DIAGNOSIS — G4733 Obstructive sleep apnea (adult) (pediatric): Secondary | ICD-10-CM

## 2023-03-10 NOTE — Telephone Encounter (Signed)
She hasn't even had her CPAP for a year. I believe she just got it in May (it was ordered in March and there is a note stating she received it 11/30/2022) and then she was hospitalized for asthma exacerbation in June, which decreased her compliance rate then. She's barely had 90 days with the machine and the last 30 days, she was 83% compliant >4 hr use so I do not understand why they would be requesting the machine back.

## 2023-03-10 NOTE — Telephone Encounter (Signed)
Mickeal Needy with [employer] sent an email regarding patient.  See email below:  I was notified about patient, [name] (Apr 01, 1971), and the attempt to pick up their CPAP. This patient has [employer] and they require compliance checks to ensure the patient is properly using the machine. The patient has also had their machine over a year and [employer] will not pay for the machine if they are not compliant after that checkpoint. The patient has not been near compliance for [facility] guidelines for them to keep paying for their machine. That is why we have been calling the patient.  I called Mitch.  He stated [name]'s readings show about a 50% compliance average over the last 12 months.  [employer] requires 70% (Mitch thinks) for compliance and to continue paying for patients CPAP and supplies.  Mitch stated to call him anytime if you have any questions regarding this patient or the reasons why [employer] is calling the patient requesting pickup of her CPAP.  His number is [phone].

## 2023-03-15 NOTE — Telephone Encounter (Signed)
I've ordered the repeat home sleep study. Amy, can you call the patient and let her know about all of this? She will need to complete the home sleep study OFF CPAP but should continue to wear the CPAP other nights. Thanks!

## 2023-03-15 NOTE — Telephone Encounter (Signed)
Left message for patient to return call to give information.

## 2023-03-15 NOTE — Telephone Encounter (Signed)
Per Katie:  Please call Brad at [employer] about this patient.  Ask about a "re-start on CPAP".  Called Brad with [employer] (and Mitch).  I asked Nida Boatman about an option where patient can get another chance to re-start her CPAP therapy.  Nida Boatman is checking with his supervisor to find out what the rules are for patient getting "start-over" to get a new initial compliance period for patient to keep her CPAP.  Per Brad:  1.  Patient will need a new order, new sleep study to re qualify.  (Just like when patient did previous HST). 2.  Patient will then need to go to the branch where patient got original CPAP.  Patient's branch office is [place].  Nida Boatman will try to get approval with branch manager of [facility] office.  Her name is Steward Drone) . 3.  Patient will need to re-sign all intake and set up documents.  [employer] will issue patient a re-delivery ticket and consent form.  Steward Drone has just put an intake ticket in for this patient so the system will show she has returned CPAP (patient does not need to actually return device).  Once they get the order and CPAP study, they will re-issue all paperwork and patient can keep CPAP with re-start on all records.

## 2023-03-15 NOTE — Telephone Encounter (Signed)
Mickeal Needy with [employer] sent this recent reply regarding [name]:  Hello Yadier Bramhall, here is the download that we talked about for patient [name]. Insurance paid for 90 days while she was trying to reach compliance. However, she only had 24% during this time and insurance will not pay beyond this since 70% was not met. This is why they are actively trying to pick it up. She will need to redo the Sleep study and retry compliance if she wishes to continue CPAP therapy. Thanks, Mickeal Needy Patient Liaison C: [phone] Email: [email or web address]  I also have a compliance report from 10/31/22 - 01/28/23.  As Marthann Schiller stated on our conversation today,  [employer] is actively trying to pick up the CPAP.  Please advise.

## 2023-03-16 ENCOUNTER — Telehealth: Payer: Self-pay | Admitting: Primary Care

## 2023-03-16 DIAGNOSIS — J029 Acute pharyngitis, unspecified: Secondary | ICD-10-CM | POA: Diagnosis not present

## 2023-03-16 DIAGNOSIS — R070 Pain in throat: Secondary | ICD-10-CM | POA: Diagnosis not present

## 2023-03-16 DIAGNOSIS — D849 Immunodeficiency, unspecified: Secondary | ICD-10-CM | POA: Diagnosis not present

## 2023-03-16 DIAGNOSIS — Z299 Encounter for prophylactic measures, unspecified: Secondary | ICD-10-CM | POA: Diagnosis not present

## 2023-03-16 NOTE — Telephone Encounter (Signed)
Called patient.  Gave all information.  Patient made aware that she is NOT to wear her CPAP while repeating the HST.  Patient verbalized understanding.  Patient will wait for [facility] to call her to schedule the repeat HST.  [name] has put in this order.  Nothing further needed at this time.

## 2023-03-16 NOTE — Telephone Encounter (Signed)
PT states Ms. [name] called her regarding a breathing test and she needs to know when and where she is to take that test. I see nothing in Active Requests, AVS notes or recent encounters. Please call @ [phone]

## 2023-03-17 NOTE — Telephone Encounter (Signed)
Called patient.  Per OV note from 03/09/2023, patient is to have a CT chest.  We have also arranged  the repeat HST regarding keeping her current CPAP machine.  Waiting on [facility] to schedule this.  Patient states she has received a call and has scheduled the CT scan in September 2024.  Patient verbalized understanding.

## 2023-03-19 DIAGNOSIS — G4733 Obstructive sleep apnea (adult) (pediatric): Secondary | ICD-10-CM | POA: Diagnosis not present

## 2023-03-24 DIAGNOSIS — D849 Immunodeficiency, unspecified: Secondary | ICD-10-CM | POA: Diagnosis not present

## 2023-03-24 DIAGNOSIS — M25541 Pain in joints of right hand: Secondary | ICD-10-CM | POA: Diagnosis not present

## 2023-03-24 DIAGNOSIS — Z299 Encounter for prophylactic measures, unspecified: Secondary | ICD-10-CM | POA: Diagnosis not present

## 2023-03-24 DIAGNOSIS — M069 Rheumatoid arthritis, unspecified: Secondary | ICD-10-CM | POA: Diagnosis not present

## 2023-03-24 DIAGNOSIS — M797 Fibromyalgia: Secondary | ICD-10-CM | POA: Diagnosis not present

## 2023-03-24 DIAGNOSIS — E11649 Type 2 diabetes mellitus with hypoglycemia without coma: Secondary | ICD-10-CM | POA: Diagnosis not present

## 2023-03-27 ENCOUNTER — Other Ambulatory Visit: Payer: 59

## 2023-03-29 DIAGNOSIS — D849 Immunodeficiency, unspecified: Secondary | ICD-10-CM | POA: Diagnosis not present

## 2023-03-29 DIAGNOSIS — G4730 Sleep apnea, unspecified: Secondary | ICD-10-CM | POA: Diagnosis not present

## 2023-03-29 DIAGNOSIS — G4733 Obstructive sleep apnea (adult) (pediatric): Secondary | ICD-10-CM

## 2023-03-31 DIAGNOSIS — E7800 Pure hypercholesterolemia, unspecified: Secondary | ICD-10-CM | POA: Diagnosis not present

## 2023-03-31 DIAGNOSIS — Z0000 Encounter for general adult medical examination without abnormal findings: Secondary | ICD-10-CM | POA: Diagnosis not present

## 2023-03-31 DIAGNOSIS — E1165 Type 2 diabetes mellitus with hyperglycemia: Secondary | ICD-10-CM | POA: Diagnosis not present

## 2023-03-31 DIAGNOSIS — Z23 Encounter for immunization: Secondary | ICD-10-CM | POA: Diagnosis not present

## 2023-03-31 DIAGNOSIS — E1142 Type 2 diabetes mellitus with diabetic polyneuropathy: Secondary | ICD-10-CM | POA: Diagnosis not present

## 2023-03-31 DIAGNOSIS — Z299 Encounter for prophylactic measures, unspecified: Secondary | ICD-10-CM | POA: Diagnosis not present

## 2023-04-06 ENCOUNTER — Ambulatory Visit
Admission: RE | Admit: 2023-04-06 | Discharge: 2023-04-06 | Disposition: A | Discharge status: Home / Self Care | Payer: 59 | Source: Ambulatory Visit | Attending: Nurse Practitioner | Admitting: Nurse Practitioner

## 2023-04-06 DIAGNOSIS — K449 Diaphragmatic hernia without obstruction or gangrene: Secondary | ICD-10-CM | POA: Diagnosis not present

## 2023-04-06 DIAGNOSIS — R0609 Other forms of dyspnea: Secondary | ICD-10-CM | POA: Diagnosis not present

## 2023-04-06 DIAGNOSIS — J189 Pneumonia, unspecified organism: Secondary | ICD-10-CM

## 2023-04-06 DIAGNOSIS — R059 Cough, unspecified: Secondary | ICD-10-CM | POA: Diagnosis not present

## 2023-04-19 DIAGNOSIS — M9902 Segmental and somatic dysfunction of thoracic region: Secondary | ICD-10-CM | POA: Diagnosis not present

## 2023-04-19 DIAGNOSIS — M546 Pain in thoracic spine: Secondary | ICD-10-CM | POA: Diagnosis not present

## 2023-04-19 DIAGNOSIS — M9903 Segmental and somatic dysfunction of lumbar region: Secondary | ICD-10-CM | POA: Diagnosis not present

## 2023-04-19 DIAGNOSIS — E1142 Type 2 diabetes mellitus with diabetic polyneuropathy: Secondary | ICD-10-CM | POA: Diagnosis not present

## 2023-04-19 DIAGNOSIS — E0520 Thyrotoxicosis with toxic multinodular goiter without thyrotoxic crisis or storm: Secondary | ICD-10-CM | POA: Diagnosis not present

## 2023-04-19 DIAGNOSIS — M9901 Segmental and somatic dysfunction of cervical region: Secondary | ICD-10-CM | POA: Diagnosis not present

## 2023-04-19 DIAGNOSIS — Z299 Encounter for prophylactic measures, unspecified: Secondary | ICD-10-CM | POA: Diagnosis not present

## 2023-04-19 DIAGNOSIS — M62830 Muscle spasm of back: Secondary | ICD-10-CM | POA: Diagnosis not present

## 2023-04-19 DIAGNOSIS — M542 Cervicalgia: Secondary | ICD-10-CM | POA: Diagnosis not present

## 2023-04-20 ENCOUNTER — Ambulatory Visit: Payer: 59 | Admitting: Nurse Practitioner

## 2023-04-25 ENCOUNTER — Ambulatory Visit
Admission: RE | Admit: 2023-04-25 | Discharge: 2023-04-25 | Disposition: A | Discharge status: Home / Self Care | Payer: 59 | Source: Ambulatory Visit | Attending: Internal Medicine | Admitting: Internal Medicine

## 2023-04-25 DIAGNOSIS — Z1231 Encounter for screening mammogram for malignant neoplasm of breast: Secondary | ICD-10-CM

## 2023-04-27 ENCOUNTER — Telehealth: Payer: Self-pay | Admitting: Nurse Practitioner

## 2023-05-01 NOTE — Telephone Encounter (Signed)
Called and spoke with pt, she verbalized understanding nfn

## 2023-05-02 DIAGNOSIS — G4733 Obstructive sleep apnea (adult) (pediatric): Secondary | ICD-10-CM | POA: Diagnosis not present

## 2023-05-09 DIAGNOSIS — E1142 Type 2 diabetes mellitus with diabetic polyneuropathy: Secondary | ICD-10-CM | POA: Diagnosis not present

## 2023-05-09 DIAGNOSIS — R52 Pain, unspecified: Secondary | ICD-10-CM | POA: Diagnosis not present

## 2023-05-09 DIAGNOSIS — Z299 Encounter for prophylactic measures, unspecified: Secondary | ICD-10-CM | POA: Diagnosis not present

## 2023-05-09 DIAGNOSIS — M25561 Pain in right knee: Secondary | ICD-10-CM | POA: Diagnosis not present

## 2023-05-09 DIAGNOSIS — M069 Rheumatoid arthritis, unspecified: Secondary | ICD-10-CM | POA: Diagnosis not present

## 2023-05-09 LAB — HEMOGLOBIN A1C: A1c: 6.3

## 2023-05-12 DIAGNOSIS — M62830 Muscle spasm of back: Secondary | ICD-10-CM | POA: Diagnosis not present

## 2023-05-12 DIAGNOSIS — M542 Cervicalgia: Secondary | ICD-10-CM | POA: Diagnosis not present

## 2023-05-12 DIAGNOSIS — M546 Pain in thoracic spine: Secondary | ICD-10-CM | POA: Diagnosis not present

## 2023-05-12 DIAGNOSIS — M9901 Segmental and somatic dysfunction of cervical region: Secondary | ICD-10-CM | POA: Diagnosis not present

## 2023-05-12 DIAGNOSIS — M9902 Segmental and somatic dysfunction of thoracic region: Secondary | ICD-10-CM | POA: Diagnosis not present

## 2023-05-12 DIAGNOSIS — M9903 Segmental and somatic dysfunction of lumbar region: Secondary | ICD-10-CM | POA: Diagnosis not present

## 2023-05-15 ENCOUNTER — Other Ambulatory Visit: Payer: Self-pay | Admitting: Nurse Practitioner

## 2023-05-15 DIAGNOSIS — J302 Other seasonal allergic rhinitis: Secondary | ICD-10-CM

## 2023-05-26 DIAGNOSIS — M9902 Segmental and somatic dysfunction of thoracic region: Secondary | ICD-10-CM | POA: Diagnosis not present

## 2023-05-26 DIAGNOSIS — M542 Cervicalgia: Secondary | ICD-10-CM | POA: Diagnosis not present

## 2023-05-26 DIAGNOSIS — M62830 Muscle spasm of back: Secondary | ICD-10-CM | POA: Diagnosis not present

## 2023-05-26 DIAGNOSIS — M546 Pain in thoracic spine: Secondary | ICD-10-CM | POA: Diagnosis not present

## 2023-05-26 DIAGNOSIS — M9903 Segmental and somatic dysfunction of lumbar region: Secondary | ICD-10-CM | POA: Diagnosis not present

## 2023-05-26 DIAGNOSIS — M9901 Segmental and somatic dysfunction of cervical region: Secondary | ICD-10-CM | POA: Diagnosis not present

## 2023-05-28 ENCOUNTER — Telehealth: Payer: Self-pay | Admitting: Pulmonary Disease

## 2023-05-28 NOTE — Telephone Encounter (Signed)
Call patient  Sleep study result  Date of study: 03/29/2023  Impression: Mild obstructive sleep apnea with mild oxygen desaturations  Recommendation: Options of treatment for mild obstructive sleep apnea will include  1.  CPAP therapy if there is significant daytime sleepiness or other comorbidities including history of CVA or cardiac disease -If CPAP is chosen as an option of treatment auto titrating CPAP with a pressure setting of 5-15 will be appropriate  2.  Watchful waiting with emphasis on weight loss measures, sleep position modification to optimize lateral sleep, elevating the head of the bed by about 30 degrees may also help.  3.  An oral device may be fashioned for the treatment of mild sleep disordered breathing, will involve referral to dentist.  Follow-up as previously scheduled

## 2023-06-01 NOTE — Patient Instructions (Signed)
 Thyroid Nodule  A thyroid nodule is an isolated growth of thyroid cells that forms a lump in the thyroid gland. The thyroid gland is a butterfly-shaped gland found in the lower front of the neck. It sends chemical messengers (hormones) through the blood to all parts of the body. These hormones are important in regulating body temperature and helping the body use energy. Thyroid nodules are common. Most are not cancerous (are benign). You may have one nodule or several nodules. There are different types of thyroid nodules. They include nodules that: Grow and fill with fluid (thyroid cysts). Produce too much thyroid hormone (hot nodules or hyperthyroid). Produce no thyroid hormone (cold nodules or hypothyroid). Form from cancer cells (thyroid cancers). What are the causes? In most cases, the cause of thyroid nodules is not known. What increases the risk? The following factors may make you more likely to develop thyroid nodules: Age. Thyroid nodules are more common in people who are older than 45 years. Female gender. A family history that includes: Thyroid nodules. Pheochromocytoma. Thyroid carcinoma. Hyperparathyroidism. Certain thyroid diseases, such as Hashimoto's thyroiditis. Lack of iodine in your diet. A history of head and neck radiation, such as from cancer treatments. Type 2 diabetes. What are the signs or symptoms? In many cases, there are no symptoms. If you have symptoms, they may include: A lump in your lower neck. Feeling pressure, fullness, or a tickle in your throat. Pain in your neck, jaw, or ear. Having trouble swallowing or breathing. Hot nodules may cause: Weight loss. Warm, flushed skin. Feeling hot. Feeling nervous. A rapid or irregular heartbeat. Cold nodules may cause: Weight gain. Dry skin. Hair loss, brittle hair, or both. Feeling cold. Fatigue. Thyroid cancer nodules may cause: Hard nodules that can be felt along the thyroid  gland. Hoarseness. Lumps in the tissue (lymph nodes) near your thyroid gland. How is this diagnosed? A thyroid nodule may be felt by your health care provider during a physical exam. This condition may also be diagnosed based on your symptoms. You may also have tests, including: Blood tests to check how well your thyroid is working. An ultrasound. This may be done to confirm the diagnosis. A biopsy. This involves taking a sample from the nodule and looking at it under a microscope. A thyroid scan. This test creates an image of the thyroid gland using a radioactive tracer. Imaging tests such as an MRI or CT scan. These may be done if: A nodule is large. A nodule is blocking your airway. Cancer is suspected. How is this treated? Treatment depends on the cause and size of your nodule or nodules. If a nodule is benign, treatment may not be necessary. Your health care provider may monitor the nodule to see if it goes away without treatment. If a nodule continues to grow, is cancerous, or does not go away, treatment may be needed. Treatment may include: Having a cystic nodule drained with a needle. Ablation therapy. In this treatment, alcohol is injected into the area of the nodule to destroy the cells. Ablation with heat may also be used. This is called thermal ablation. Radioactive iodine. In this treatment, radioactive iodine is given as a pill or liquid that you drink. This substance causes the thyroid nodule to shrink. Surgery to remove the nodule or nodules. Part or all of your thyroid gland may also need to be removed. Medicines to treat hyperthyroidism. Follow these instructions at home: Pay attention to any changes in your thyroid nodule or nodules. Take over-the-counter  and prescription medicines only as told by your health care provider. Keep all follow-up visits. This is important. Contact a health care provider if: You have trouble sleeping. You have muscle weakness. You have  significant weight loss without changing your eating habits. You feel nervous. You have trouble swallowing. You have increased swelling. You have a rapid or irregular heartbeat. Get help right away if: You have chest pain. You faint or lose consciousness. Your nodule makes it hard for you to breathe. These symptoms may be an emergency. Get help right away. Call 911. Do not wait to see if the symptoms will go away. Do not drive yourself to the hospital. Summary A thyroid nodule is an isolated growth of thyroid cells that forms a lump in your thyroid gland. Thyroid nodules are common. Most are not cancerous. Your health care provider may monitor the nodule to see if it goes away without treatment. If a nodule continues to grow, is cancerous, or does not go away, treatment may be needed. Treatment depends on the cause and size of your nodule or nodules. This information is not intended to replace advice given to you by your health care provider. Make sure you discuss any questions you have with your health care provider. Document Revised: 05/17/2021 Document Reviewed: 05/17/2021 Elsevier Patient Education  2024 [employer].

## 2023-06-05 ENCOUNTER — Encounter: Payer: Self-pay | Admitting: Nurse Practitioner

## 2023-06-05 ENCOUNTER — Ambulatory Visit (INDEPENDENT_AMBULATORY_CARE_PROVIDER_SITE_OTHER): Payer: 59 | Admitting: Nurse Practitioner

## 2023-06-05 VITALS — BP 128/74 | HR 72 | Ht 68.0 in | Wt 232.2 lb

## 2023-06-05 DIAGNOSIS — E041 Nontoxic single thyroid nodule: Secondary | ICD-10-CM | POA: Diagnosis not present

## 2023-06-05 NOTE — Progress Notes (Signed)
Endocrinology Consult Note 06/05/23    ---------------------------------------------------------------------------------------------------------------------- Subjective    Past Medical History:  Diagnosis Date   Anemia    Anxiety    Arthritis    orthopedic   Bipolar disorder (HCC)    Depression    Fibromyalgia    GERD (gastroesophageal reflux disease)    Hypotension    IBS (irritable bowel syndrome)    Migraine    PTSD (post-traumatic stress disorder)    on prazosin   Sleep apnea    Tonsillar hypertrophy    Uterine fibroid     Past Surgical History:  Procedure Laterality Date   ENDOMETRIAL ABLATION     TONSILLECTOMY AND ADENOIDECTOMY N/A 02/13/2018   Procedure: TONSILLECTOMY AND ADENOIDECTOMY;  Surgeon: Newman Pies, MD;  Location: [facility];  Service: ENT;  Laterality: N/A;   TUBAL LIGATION      Social History   Socioeconomic History   Marital status: Divorced    Spouse name: Not on file   Number of children: Not on file   Years of education: Not on file   Highest education level: Not on file  Occupational History   Not on file  Tobacco Use   Smoking status: Never   Smokeless tobacco: Never  Vaping Use   Vaping status: Never Used  Substance and Sexual Activity   Alcohol use: No   Drug use: No   Sexual activity: Not Currently    Birth control/protection: Surgical    Comment: tubal and ablation  Other Topics Concern   Not on file  Social History Narrative   Not on file   Social Determinants of Health   Financial Resource Strain: Not on file  Food Insecurity: No Food Insecurity (12/29/2022)   Hunger Vital Sign    Worried About Running Out of Food in the Last Year: Never true    Ran Out of Food in the Last Year: Never true  Transportation Needs: No Transportation Needs (12/29/2022)   PRAPARE - Administrator, Civil Service (Medical): No    Lack of Transportation (Non-Medical): No  Physical Activity: Not on file  Stress: Not on  file  Social Connections: Not on file  Intimate Partner Violence: Not At Risk (12/29/2022)   Humiliation, Afraid, Rape, and Kick questionnaire    Fear of Current or Ex-Partner: No    Emotionally Abused: No    Physically Abused: No    Sexually Abused: No    Current Outpatient Medications on File Prior to Visit  Medication Sig Dispense Refill   albuterol (PROVENTIL) (2.5 MG/3ML) 0.083% nebulizer solution Take 3 mLs (2.5 mg total) by nebulization every 4 (four) hours as needed for wheezing or shortness of breath. 75 mL 2   albuterol (VENTOLIN HFA) 108 (90 Base) MCG/ACT inhaler Inhale 2 puffs into the lungs every 4 (four) hours as needed for wheezing or shortness of breath. 18 g 2   allopurinol (ZYLOPRIM) 300 MG tablet Take 300 mg by mouth daily.     ALPRAZolam (XANAX) 0.5 MG tablet Take 0.5 mg by mouth 2 (two) times daily as needed for anxiety.     atorvastatin (LIPITOR) 40 MG tablet Take 40 mg by mouth daily.     Budeson-Glycopyrrol-Formoterol (BREZTRI AEROSPHERE) 160-9-4.8 MCG/ACT AERO Inhale 2 puffs into the lungs in the morning and at bedtime. 10.7 g 3   cholecalciferol (VITAMIN D3) 25 MCG (1000 UNIT) tablet Take 4,000 Units by mouth daily.     colchicine 0.6 MG tablet Take 0.6 mg by  mouth daily.     diclofenac Sodium (VOLTAREN) 1 % GEL Apply 1 Application topically 4 (four) times daily.     DULoxetine (CYMBALTA) 60 MG capsule Take 60 mg by mouth daily.     folic acid (FOLVITE) 1 MG tablet Take 1 mg by mouth daily.     furosemide (LASIX) 20 MG tablet Take 1 tablet (20 mg total) by mouth daily. 30 tablet 1   hydrOXYzine (ATARAX) 50 MG tablet Take 0.5 tablets (25 mg total) by mouth 2 (two) times daily as needed for itching or anxiety. 30 tablet 3   ipratropium-albuterol (DUONEB) 0.5-2.5 (3) MG/3ML SOLN Take 3 mLs by nebulization every 6 (six) hours as needed. 360 mL 3   LATUDA 40 MG TABS tablet Take 40 mg by mouth at bedtime.     levocetirizine (XYZAL) 5 MG tablet take 1 tablet (5 MILLIGRAM  total) by mouth daily. 30 tablet 11   metFORMIN (GLUCOPHAGE) 500 MG tablet Take 1 tablet (500 mg total) by mouth daily with breakfast. 90 tablet 3   mirabegron ER (MYRBETRIQ) 25 MG TB24 tablet Take 25 mg by mouth daily.     montelukast (SINGULAIR) 10 MG tablet Take 1 tablet (10 mg total) by mouth at bedtime. 30 tablet 11   MOUNJARO 5 MG/0.5ML Pen Inject 5 mg into the skin once a week.     Oxcarbazepine (TRILEPTAL) 300 MG tablet Take 1 tablet (300 mg total) by mouth 2 (two) times daily. (Patient taking differently: Take 600 mg by mouth at bedtime.) 60 tablet 2   pantoprazole (PROTONIX) 40 MG tablet Take 1 tablet (40 mg total) by mouth daily. 30 tablet 1   prazosin (MINIPRESS) 5 MG capsule Take 5 mg by mouth 3 (three) times daily. @ Bedtime     pregabalin (LYRICA) 75 MG capsule Take 1 capsule by mouth daily.     promethazine (PHENERGAN) 25 MG tablet Take 25 mg by mouth every 6 (six) hours as needed.     vitamin B-12 (CYANOCOBALAMIN) 100 MCG tablet Take 100 mcg by mouth daily.     acetaminophen (TYLENOL) 325 MG tablet Take 2 tablets (650 mg total) by mouth every 6 (six) hours as needed for mild pain (or Fever >/= 101). (Patient not taking: Reported on 02/01/2023) 100 tablet 0   HYDROcodone-acetaminophen (NORCO) 5-325 MG tablet Take 1 tablet by mouth every 6 (six) hours as needed for moderate pain. (Patient not taking: Reported on 06/05/2023) 6 tablet 0   methotrexate 2.5 MG tablet Take 25 mg by mouth once a week. Caution:Chemotherapy. Protect from light.  Takes 10 per day once a week. (Patient not taking: Reported on 02/01/2023)     No current facility-administered medications on file prior to visit.      HPI   [name] is a 52 y.o.-year-old female, referred by her PCP, Dr.Shah, for evaluation for multinodular goiter.   Thyroid U/S:                   I reviewed pt's thyroid tests: Lab Results  Component Value Date   TSH 2.29 02/25/2023   TSH 2.29 02/25/2023    TSH 1.668 05/23/2022     Pt c/o: - diffuse joint aches (Hx of fibromyalgia and RA- recently restarted Methotrexate) - temperature fluctuations - dry skin - hair loss - SOB with lying down - dysphagia - feeling nodules in neck  She notes these symptoms started in July.  Pt denies - hoarseness - choking  She does have  family history of thyroid dysfunction in her daughter (hyperthyroidism). No FH of thyroid cancer. No h/o radiation tx to head or neck.  No seaweed or kelp. No recent contrast studies. No steroid use. No herbal supplements. No Biotin supplements or Hair, Skin and Nails vitamins.  Pt also has a history of RA, DM, PTSD, fibromyalgia, OSA.  Review of systems  Constitutional: + decreasing body weight,  current Body mass index is 35.31 kg/m. , no fatigue, + temperature fluctuations Eyes: no blurry vision, no xerophthalmia ENT: + intermittent sore throat, + nodules palpated in throat, + intermittent dysphagia/odynophagia, no hoarseness, + increased throat clearing Cardiovascular: no chest pain, + intermittent shortness of breath, no palpitations, no leg swelling Respiratory: no cough, + intermittent shortness of breath Gastrointestinal: no nausea/vomiting/diarrhea Musculoskeletal: + diffuse muscle/joint aches Skin: no rashes, no hyperemia, + dry skin Neurological: no tremors, no numbness, no tingling, no dizziness Psychiatric: no depression, no anxiety  ---------------------------------------------------------------------------------------------------------------------- Objective    BP 128/74 (BP Location: Right Arm, Patient Position: Sitting, Cuff Size: Large)   Pulse 72   Ht 5\' 8"  (1.727 m)   Wt 232 lb 3.2 oz (105.3 kg)   BMI 35.31 kg/m    BP Readings from Last 3 Encounters:  06/05/23 128/74  03/09/23 116/78  02/17/23 122/68    Wt Readings from Last 3 Encounters:  06/05/23 232 lb 3.2 oz (105.3 kg)  03/09/23 254 lb 6.4 oz (115.4 kg)  02/17/23 249 lb  (112.9 kg)     Physical Exam- Limited  Constitutional:  Body mass index is 35.31 kg/m. , not in acute distress, normal state of mind Eyes:  EOMI, no exophthalmos Neck: Supple Thyroid: R>L Cardiovascular: RRR, no murmurs, rubs, or gallops, no edema Respiratory: Adequate breathing efforts, no crackles, rales, rhonchi, or wheezing Musculoskeletal: no gross deformities, strength intact in all four extremities, no gross restriction of joint movements Skin:  no rashes, no hyperemia Neurological: no tremor with outstretched hands    ----------------------------------------------------------------------------------------------------------------------  ASSESSMENT / PLAN:  1. Thyroid Nodule  - I reviewed the images of her thyroid ultrasound along with the patient. I pointed out that the dominant nodules are large, this being a risk factor for cancer.  Otherwise, the nodules are: - not hypoechoic - without microcalcifications - without internal blood flow - more wide than tall - well delimited from surrounding tissue  Pt does not have a thyroid cancer family history or a personal history of RxTx to head/neck. All these would favor benignity.  - the only way that we can tell exactly if it is cancer or not is by doing a thyroid biopsy (FNA). I explained what the test entails. - We discussed about other options, to wait for another 6 months to a year and see if the nodule grows, and only intervene at that time.   - patient decided to have the FNA done now >> I ordered this.   - I explained that this is not cancer, we can continue to follow her on a yearly basis, and check another ultrasound in another year or 2. - she should let me know if she develops neck compression symptoms, in that case, we might need to do either lobectomy or thyroidectomy  - I did explain that, while thyroid surgery is not a complicated one, it still can have side effects and also she might have a risk of ~25% of  becoming hypothyroid after hemithyroidectomy.   I will also check more comprehensive thyroid labs, including antibody testing to assess for  autoimmune thyroid dysfunction.   Follow Up Plan: Return will call with thyroid labs and biopsy results and plan moving forward.    I spent 60 minutes in the care of the patient today including review of labs from Thyroid Function, CMP, and other relevant labs ; imaging/biopsy records (current and previous including abstractions from other facilities); face-to-face time discussing  her lab results and symptoms, medications doses, her options of short and long term treatment based on the latest standards of care / guidelines;   and documenting the encounter.  [name]  participated in the discussions, expressed understanding, and voiced agreement with the above plans.  All questions were answered to her satisfaction. she is encouraged to contact clinic should she have any questions or concerns prior to her return visit.    Ronny Bacon, [facility] [address] [place], [place] [address] Phone: [phone] Fax: [phone]

## 2023-06-05 NOTE — Progress Notes (Unsigned)
Jordan Big, MD  [name]; P Ir Procedure Requests Approved.  HKM       Previous Messages    ----- Message ----- From: [name] Sent: 06/05/2023  11:37 AM EST To: [name]; Ir Procedure Requests Subject: [place] FNA BX THYROID 1ST LESION AFIRMA            Procedure: [place] FNA BX THYROID 1ST LESION AFIRMA  Reason: 3.2 cm nodule in right mid thyroid gland Dx: Thyroid nodule [E04.1 (ICD-10-CM)]    History: [place] thyroid - done at [facility] Care [facility]  Provider : Dani Gobble, NP  Provider contact : [phone]

## 2023-06-06 ENCOUNTER — Telehealth: Payer: Self-pay | Admitting: *Deleted

## 2023-06-06 ENCOUNTER — Encounter: Payer: Self-pay | Admitting: Nurse Practitioner

## 2023-06-06 LAB — TSH: TSH: 3.03 u[IU]/mL (ref 0.450–4.500)

## 2023-06-06 LAB — T3, FREE: T3, Free: 3.3 pg/mL (ref 2.0–4.4)

## 2023-06-06 LAB — T4, FREE: Free T4: 1.05 ng/dL (ref 0.82–1.77)

## 2023-06-06 LAB — THYROID PEROXIDASE ANTIBODY: Thyroperoxidase Ab SerPl-aCnc: 9 [IU]/mL (ref 0–34)

## 2023-06-06 LAB — THYROGLOBULIN ANTIBODY: Thyroglobulin Antibody: <1 [IU]/mL (ref 0.0–0.9)

## 2023-06-06 NOTE — Progress Notes (Signed)
FYI: I sent mychart message going over recent thyroid lab results.

## 2023-06-06 NOTE — Telephone Encounter (Signed)
Noted that [name] has sent the patient a MyCart message going over results.

## 2023-06-06 NOTE — Telephone Encounter (Signed)
-----   Message from Dani Gobble sent at 06/06/2023  8:06 AM EST ----- FYI: I sent mychart message going over recent thyroid lab results.

## 2023-06-07 ENCOUNTER — Telehealth: Payer: Self-pay | Admitting: *Deleted

## 2023-06-07 ENCOUNTER — Encounter: Payer: Self-pay | Admitting: Nurse Practitioner

## 2023-06-07 ENCOUNTER — Telehealth: Payer: 59 | Admitting: Nurse Practitioner

## 2023-06-07 DIAGNOSIS — J4550 Severe persistent asthma, uncomplicated: Secondary | ICD-10-CM

## 2023-06-07 DIAGNOSIS — G4733 Obstructive sleep apnea (adult) (pediatric): Secondary | ICD-10-CM | POA: Diagnosis not present

## 2023-06-07 DIAGNOSIS — J309 Allergic rhinitis, unspecified: Secondary | ICD-10-CM

## 2023-06-07 DIAGNOSIS — K219 Gastro-esophageal reflux disease without esophagitis: Secondary | ICD-10-CM

## 2023-06-07 MED ORDER — FAMOTIDINE 20 MG PO TABS
20.0000 mg | ORAL_TABLET | Freq: Every day | ORAL | 5 refills | Status: DC
Start: 2023-06-07 — End: 2023-12-14

## 2023-06-07 MED ORDER — AZELASTINE HCL 0.1 % NA SOLN
2.0000 | Freq: Two times a day (BID) | NASAL | 5 refills | Status: AC
Start: 2023-06-07 — End: ?

## 2023-06-07 NOTE — Patient Instructions (Addendum)
Continue Breztri 2 puffs Twice daily. Brush tongue and rinse mouth afterwards Continue Albuterol inhaler 2 puffs or 3 mL every 6 hours as needed for shortness of breath or wheezing. Notify if symptoms persist despite rescue inhaler/neb use. Continue levocetirizine 5 mg daily for allergies - new prescription sent to pharmacy  Continue Saline nasal rinses (Netti Pot or [employer]) 1-2 times a day. Use the flonase about 20-30 min after Continue Flonase nasal spray 1-2 sprays each nostril daily for nasal congestion/drainage  Continue singulair 1 tab At bedtime for allergies/asthma  Astelin nasal spray 2 sprays each nostril Twice daily for sinus drainage, allergies Guaifenesin 600 mg Twice daily for congestion Famotidine (pepcid) 20 mg At bedtime for reflux   Restart CPAP usage to every night, minimum of 4-6 hours a night.  Change equipment as directed. Wash your tubing with warm soap and water daily, hang to dry. Wash humidifier portion weekly. Use bottled distilled water and change daily Be aware of reduced alertness and do not drive or operate heavy machinery if experiencing this or drowsiness.    Change to AirFit N20 mask    Follow up in 8-10 weeks with Dr. [name] or Philis Nettle. If symptoms do not improve or worsen, please contact office for sooner follow up or seek emergency care.

## 2023-06-07 NOTE — Assessment & Plan Note (Signed)
Stable without acute exacerbation.  Appears compensated on current regimen.  Advised to call if breathing worsens.  Action plan in place.

## 2023-06-07 NOTE — Telephone Encounter (Signed)
Patient left a voicemail on 06/06/23 in the afternoon. She states that she was seen in office on 06/05/23. She is having severe itching, related to her thyroid. She hares that this was discussed at her office visit. She is asking is there anything that she can be given to help with the itching?

## 2023-06-07 NOTE — Assessment & Plan Note (Signed)
Worsening allergy symptoms with change in weather.  Add on Astelin nasal spray and over the counter expectorant.  Continue daily antihistamine with Xyzal, Singulair and intranasal steroid.

## 2023-06-07 NOTE — Assessment & Plan Note (Signed)
Mild OSA. Previously on CPAP with benefit. She initially had difficulties adjusting and then had a hospitalization so was deemed noncompliant by DME and had to turn in her CPAP even though she had 83% usage >4 hr at her last follow up. Reviewed treatment options and risks of untreated OSA. She would like to resume therapy. Orders placed for auto CPAP 8-18 cmH2O, AirFit N20 mask, and heated humidity. Understands proper use/care of device.  Aware of safe driving practices.  Healthy weight loss encouraged.  Patient Instructions  Continue Breztri 2 puffs Twice daily. Brush tongue and rinse mouth afterwards Continue Albuterol inhaler 2 puffs or 3 mL every 6 hours as needed for shortness of breath or wheezing. Notify if symptoms persist despite rescue inhaler/neb use. Continue levocetirizine 5 mg daily for allergies - new prescription sent to pharmacy  Continue Saline nasal rinses ([employer] Pot or [employer]) 1-2 times a day. Use the flonase about 20-30 min after Continue Flonase nasal spray 1-2 sprays each nostril daily for nasal congestion/drainage  Continue singulair 1 tab At bedtime for allergies/asthma  Astelin nasal spray 2 sprays each nostril Twice daily for sinus drainage, allergies Guaifenesin 600 mg Twice daily for congestion Famotidine (pepcid) 20 mg At bedtime for reflux   Restart CPAP usage to every night, minimum of 4-6 hours a night.  Change equipment as directed. Wash your tubing with warm soap and water daily, hang to dry. Wash humidifier portion weekly. Use bottled distilled water and change daily Be aware of reduced alertness and do not drive or operate heavy machinery if experiencing this or drowsiness.    Change to AirFit N20 mask    Follow up in 8-10 weeks with Dr. [name] or Philis Nettle. If symptoms do not improve or worsen, please contact office for sooner follow up or seek emergency care.

## 2023-06-07 NOTE — Progress Notes (Signed)
Patient ID: Jordan Jordan, female     DOB: January 27, 1971, 52 y.o.      MRN: [id number]  Chief Complaint  Patient presents with   Follow-up    Home Sleep Test 09 05-2023    Virtual Visit via Video Note  I connected with Jordan Jordan on 06/07/23 at 11:30 AM EST by a video enabled telemedicine application and verified that I am speaking with the correct person using two identifiers.  Location: Patient: Home Provider: Office   I discussed the limitations of evaluation and management by telemedicine and the availability of in person appointments. The patient expressed understanding and agreed to proceed.  History of Present Illness: 52 year old patient, never smoker followed for asthma and OSA. She is a patient of Jordan Jordan and last seen 03/09/2023 by [facility] NP. Past medical history significant for GERD, RA on methotrexate, bipolar, depression.   TESTS/EVENTS: 07/21/2022 HST: AHI 15/h, SpO2 low 82% 10/06/2022 PFT: FVC 65, FEV1 68, ratio 88, TLC 74, DLCOcor 72 12/28/2022 CTA chest: No PE.  Moderate sized hiatal hernia, stable.  Borderline sized bilateral hilar and mediastinal lymph nodes.  1 cm low-density lesion in the right thyroid lobe, smaller than prior study.  Parabronchial thickening.  Nodular groundglass opacities throughout the right lung, most pronounced in right lower lobe and posterior right upper lobe.  Few scattered groundglass nodularities in the left lung. Likely infectious/inflammatory 03/29/2023 HST: AHI 10.6/h, SpO2 low 70%   06/28/2022: OV with Dr. Francine Jordan for hospital follow up. She was admitted 11/6-11/8 for asthma exacerbation due to RSV infection. She had been having DOE since    10/10/2022: Ov with Jordan Ethier NP for follow up after undergoing PFTs and HST. She was found to have a restrictive defect and minimal diffusion defect on her pulmonary function testing. Her home sleep study revealed mildly moderate OSA. She has never been treated for this with CPAP before.   Today, she tells me that she was doing much better after she started the Jordan Jordan inhaler. Over the past few days (starting approx 3/21), she developed sinus congestion, sneezing, watery eyes. She thought it was allergies. She now has a cough and is feeling like her chest is a little tighter, especially when she lays back. She also notices more wheezing at night. She feels like she gets more short winded with activities around the house but doesn't feel like her shortness of breath is severe. She denies any fevers, chills, hemoptysis, leg swelling. She is using her Breztri twice a day. Increased use of albuterol over the past few days. She is not currently on anything for allergies.  Regarding her sleep, she feels very tired during the day. She snores at night. She denies any drowsy driving or morning headaches. She would like to know what treatment options there are.    02/01/2023: OV with Jordan Bushee NP for follow up.  She was recently hospitalized from 12/28/2022-12/30/2022 for pneumonia treated with cefdinir and azithromycin.  Prior to this she had been seen in the emergency department 12/25/2022 for myalgias/arthralgias.  She had, had tick bites prior to this and had been on doxycycline.  She was provided with Norco and discharged home.  She also had viral testing that was negative. She tells me today that she is finally starting to feel better.  She is still having some more shortness of breath compared to her baseline and she does not feel like her stamina is quite back to where it was before.  She does  still have an occasional cough but it is primarily dry.  Feels like it is improving.  She has not noticed quite as much wheezing.  Denies any fevers, chills, hemoptysis.  Myalgias and arthralgias are better as well.  She is still using her nebs twice a day.  She is been able to back off from 4 times a day.  Still on Breztri twice a day.  Is having difficulties with her taste and smell.  Does not feel like she can  taste much of anything and she is not able to smell her candles like she usually is.  She did test negative for COVID so she is not sure why this is.  Does have some sinus symptoms which do not feel much worse than her baseline.  She was using Flonase a few times a day but then it made her nosebleed. She has been having trouble with her CPAP due to not feeling like she is getting enough air.  She says that she thought this was related to recovering from pneumonia but then she also thinks that it might be a mass problem.  She is used a couple different masks.  Currently settled on nasal pillow which she still does not always feel it gives her enough air at times.  She tends to have to open her mouth to breathe more easily.  She denies any drowsy driving or morning headaches. 01/01/2023-01/30/2023: CPAP auto 5-15 cmH2O 13/30 days; 17% > 4 hours; average use 3 hours 28 minutes Pressure 95th 10.1 Leaks 95th 8.2 AHI 1.9   03/09/2023: OV with Jordan Diven NP for follow-up.  Has been doing better since she was here last.  She does still have some occasional shortness of breath and sometimes feels like her breath catches.  Notices it more so when she is outside and it is hot.  Cough is mostly gone.  Not noticing any wheezing or chest congestion.  She did have an RA flare and is currently on prednisone for this.  Denies any fevers, chills, hemoptysis, lower extremity swelling, orthopnea.  She is on Breztri twice a day.  Taste and smell is getting better.  She has been using Flonase, which helped her sinus symptoms but she does occasionally still have some postnasal drainage.  She feels like this is primarily her allergies that bother her.  She is taking a daily allergy pill which does help. She feels like she has been doing better with her CPAP.  Wearing it most nights.  Occasionally will wake up with it off.  She has been getting calls from [employer] about not wearing her CPAP enough.  She is confused about this because she has  been wearing it more consistently.  She was in the hospital before so she is not sure if this is the compliance problem they are talking about.  Denies any drowsy driving or morning headaches.  She does receive benefit from using her CPAP. 02/07/2023-03/08/2023: CPAP 8-20 cmH2O 29/30 days; 83% >4 hr; average use 6 hr 28 min Pressure 95th 15 Leaks 12.8 AHI 2.3   06/07/2023: Today - follow up Patient presents today for follow-up.  After last visit, we had to repeat her home sleep study due to previous compliance issues.  The medical supply company stated that she would have to start over with the process as she had been deemed noncompliant prior to July, even though she had started to use her CPAP consistently and was receiving benefit from use.  She does miss using  the CPAP.  She felt like she slept better at night with it.  She is only able to do 3 to 4 hours at a time now.  Has daytime fatigue symptoms.  Wants to restart therapy.  Denies any issues with drowsy driving or morning headaches.  Her only concern is that she is having some issues with a thyroid nodule which does cause some difficulties with swallowing.  She has an upcoming biopsy.  She is a little worried that she may not be able to tolerate using the CPAP with this nodule but is willing to retry.  Will plan to set up her appointment after her biopsy so hopefully will have more answers at that point in time.  Her breathing is doing okay for the most part.  She is having some increased sinus symptoms with the current weather.  Using her Xyzal, Flonase and doing saline rinses.  She still taking Singulair at bedtime.  Not using her rescue much.  Still on Breztri twice a day, which does help her.  She does also occasionally wake up in the morning feeling a little nauseated and like she has stuff in the back of her throat.  She is not sure if this postnasal drainage or reflux.  She does take pantoprazole daily.  No other overt signs of reflux.  No  fevers, chills, hoarse voice, ear pain, sore throat. No sick exposures.  Allergies  Allergen Reactions   Peanut-Containing Drug Products Anaphylaxis    Swelling    Tegretol [Carbamazepine] Shortness Of Breath and Swelling   Beef Allergy     Does not eat red meat   Lithium Other (See Comments)    Bad acne    Pork-Derived Products Other (See Comments)    Does not eat pork products   Sweet Potato Itching   Penicillins Rash   There is no immunization history for the selected administration types on file for this patient. Past Medical History:  Diagnosis Date   Anemia    Anxiety    Arthritis    orthopedic   Bipolar disorder (HCC)    Depression    Fibromyalgia    GERD (gastroesophageal reflux disease)    Hypotension    IBS (irritable bowel syndrome)    Migraine    PTSD (post-traumatic stress disorder)    on prazosin   Sleep apnea    Tonsillar hypertrophy    Uterine fibroid     Tobacco History: Social History   Tobacco Use  Smoking Status Never  Smokeless Tobacco Never   Counseling given: Not Answered   Outpatient Medications Prior to Visit  Medication Sig Dispense Refill   acetaminophen (TYLENOL) 325 MG tablet Take 2 tablets (650 mg total) by mouth every 6 (six) hours as needed for mild pain (or Fever >/= 101). 100 tablet 0   albuterol (PROVENTIL) (2.5 MG/3ML) 0.083% nebulizer solution Take 3 mLs (2.5 mg total) by nebulization every 4 (four) hours as needed for wheezing or shortness of breath. 75 mL 2   albuterol (VENTOLIN HFA) 108 (90 Base) MCG/ACT inhaler Inhale 2 puffs into the lungs every 4 (four) hours as needed for wheezing or shortness of breath. 18 g 2   allopurinol (ZYLOPRIM) 300 MG tablet Take 300 mg by mouth daily.     ALPRAZolam (XANAX) 0.5 MG tablet Take 0.5 mg by mouth 2 (two) times daily as needed for anxiety.     atorvastatin (LIPITOR) 40 MG tablet Take 40 mg by mouth daily.  Budeson-Glycopyrrol-Formoterol (BREZTRI AEROSPHERE) 160-9-4.8 MCG/ACT  AERO Inhale 2 puffs into the lungs in the morning and at bedtime. 10.7 g 3   cholecalciferol (VITAMIN D3) 25 MCG (1000 UNIT) tablet Take 4,000 Units by mouth daily.     colchicine 0.6 MG tablet Take 0.6 mg by mouth daily.     diclofenac Sodium (VOLTAREN) 1 % GEL Apply 1 Application topically 4 (four) times daily.     DULoxetine (CYMBALTA) 60 MG capsule Take 60 mg by mouth daily.     folic acid (FOLVITE) 1 MG tablet Take 1 mg by mouth daily.     furosemide (LASIX) 20 MG tablet Take 1 tablet (20 mg total) by mouth daily. 30 tablet 1   HYDROcodone-acetaminophen (NORCO) 5-325 MG tablet Take 1 tablet by mouth every 6 (six) hours as needed for moderate pain. 6 tablet 0   hydrOXYzine (ATARAX) 50 MG tablet Take 0.5 tablets (25 mg total) by mouth 2 (two) times daily as needed for itching or anxiety. 30 tablet 3   ipratropium-albuterol (DUONEB) 0.5-2.5 (3) MG/3ML SOLN Take 3 mLs by nebulization every 6 (six) hours as needed. 360 mL 3   LATUDA 40 MG TABS tablet Take 40 mg by mouth at bedtime.     levocetirizine (XYZAL) 5 MG tablet take 1 tablet (5 MILLIGRAM total) by mouth daily. 30 tablet 11   metFORMIN (GLUCOPHAGE) 500 MG tablet Take 1 tablet (500 mg total) by mouth daily with breakfast. 90 tablet 3   methotrexate 2.5 MG tablet Take 25 mg by mouth once a week. Caution:Chemotherapy. Protect from light.  Takes 10 per day once a week.     mirabegron ER (MYRBETRIQ) 25 MG TB24 tablet Take 25 mg by mouth daily.     montelukast (SINGULAIR) 10 MG tablet Take 1 tablet (10 mg total) by mouth at bedtime. 30 tablet 11   MOUNJARO 5 MG/0.5ML Pen Inject 5 mg into the skin once a week.     Oxcarbazepine (TRILEPTAL) 300 MG tablet Take 1 tablet (300 mg total) by mouth 2 (two) times daily. (Patient taking differently: Take 600 mg by mouth at bedtime.) 60 tablet 2   prazosin (MINIPRESS) 5 MG capsule Take 5 mg by mouth 3 (three) times daily. @ Bedtime     pregabalin (LYRICA) 75 MG capsule Take 1 capsule by mouth daily.      promethazine (PHENERGAN) 25 MG tablet Take 25 mg by mouth every 6 (six) hours as needed.     vitamin B-12 (CYANOCOBALAMIN) 100 MCG tablet Take 100 mcg by mouth daily.     pantoprazole (PROTONIX) 40 MG tablet Take 1 tablet (40 mg total) by mouth daily. 30 tablet 1   No facility-administered medications prior to visit.     Review of Systems:   Constitutional: No weight loss or gain, night sweats, fevers, chills, or lassitude. +fatigue  HEENT: No tooth/dental problems, or sore throat. No sneezing, itching, ear ache. + nasal congestion, post nasal drip, difficulty swallowing, occasional sinus headaches  CV:  No chest pain, orthopnea, PND, swelling in lower extremities, anasarca, dizziness, palpitations, syncope Resp: +snoring, shortness of breath with exertion (baseline). No excess mucus or change in color of mucus. No productive or non-productive. No hemoptysis. No wheezing.  No chest wall deformity GI:  +AM nausea. No heartburn, indigestion, abdominal pain, vomiting, diarrhea, change in bowel habits, loss of appetite, bloody stools.  GU: No dysuria, change in color of urine, urgency or frequency.  No flank pain, no hematuria  Skin: No rash, lesions,  ulcerations MSK:  No joint pain or swelling.   Neuro: No dizziness or lightheadedness.  Psych: No depression or anxiety. Mood stable. +sleep disturbance   Observations/Objective: Patient is well-developed, well-nourished in no acute distress. A&Ox4. Resting comfortably at home. Unlabored breathing. Speech is clear and coherent with logical content.   Assessment and Plan: Mild obstructive sleep apnea Mild OSA. Previously on CPAP with benefit. She initially had difficulties adjusting and then had a hospitalization so was deemed noncompliant by DME and had to turn in her CPAP even though she had 83% usage >4 hr at her last follow up. Reviewed treatment options and risks of untreated OSA. She would like to resume therapy. Orders placed for auto CPAP  8-18 cmH2O, AirFit N20 mask, and heated humidity. Understands proper use/care of device.  Aware of safe driving practices.  Healthy weight loss encouraged.  Patient Instructions  Continue Breztri 2 puffs Twice daily. Brush tongue and rinse mouth afterwards Continue Albuterol inhaler 2 puffs or 3 mL every 6 hours as needed for shortness of breath or wheezing. Notify if symptoms persist despite rescue inhaler/neb use. Continue levocetirizine 5 mg daily for allergies - new prescription sent to pharmacy  Continue Saline nasal rinses (Netti Pot or [employer]) 1-2 times a day. Use the flonase about 20-30 min after Continue Flonase nasal spray 1-2 sprays each nostril daily for nasal congestion/drainage  Continue singulair 1 tab At bedtime for allergies/asthma  Astelin nasal spray 2 sprays each nostril Twice daily for sinus drainage, allergies Guaifenesin 600 mg Twice daily for congestion Famotidine (pepcid) 20 mg At bedtime for reflux   Restart CPAP usage to every night, minimum of 4-6 hours a night.  Change equipment as directed. Wash your tubing with warm soap and water daily, hang to dry. Wash humidifier portion weekly. Use bottled distilled water and change daily Be aware of reduced alertness and do not drive or operate heavy machinery if experiencing this or drowsiness.    Change to AirFit N20 mask    Follow up in 8-10 weeks with Dr. Francine Jordan or Philis Nettle. If symptoms do not improve or worsen, please contact office for sooner follow up or seek emergency care.    Allergic rhinitis Worsening allergy symptoms with change in weather.  Add on Astelin nasal spray and over the counter expectorant.  Continue daily antihistamine with Xyzal, Singulair and intranasal steroid.  Severe persistent asthma Stable without acute exacerbation.  Appears compensated on current regimen.  Advised to call if breathing worsens.  Action plan in place.    I discussed the assessment and treatment plan with the  patient. The patient was provided an opportunity to ask questions and all were answered. The patient agreed with the plan and demonstrated an understanding of the instructions.   The patient was advised to call back or seek an in-person evaluation if the symptoms worsen or if the condition fails to improve as anticipated.  I provided 32 minutes of non-face-to-face time during this encounter.   Jordan Chapel, NP

## 2023-06-07 NOTE — Progress Notes (Deleted)
@Patient  ID: [name], female    DOB: [birth year], 52 y.o.   MRN: [id number]  Chief Complaint  Patient presents with   Follow-up    Home Sleep Test 09 05-2023    Referring provider: Kirstie Peri, MD  HPI: [name] patient, never smoker followed for asthma and OSA. She is a patient of [name] and last seen 03/09/2023 by [facility]. Past medical history significant for GERD, RA on methotrexate, bipolar, depression.   TESTS/EVENTS: 10/06/2022 PFT: FVC 65, FEV1 68, ratio 88, TLC 74, DLCOcor 72 12/28/2022 CTA chest: No PE.  Moderate sized hiatal hernia, stable.  Borderline sized bilateral hilar and mediastinal lymph nodes.  1 cm low-density lesion in the right thyroid lobe, smaller than prior study.  Parabronchial thickening.  Nodular groundglass opacities throughout the right lung, most pronounced in right lower lobe and posterior right upper lobe.  Few scattered groundglass nodularities in the left lung. Likely infectious/inflammatory  06/28/2022: OV with Dr. [name] for hospital follow up. She was admitted 11/6-11/8 for asthma exacerbation due to RSV infection. She had been having DOE since    10/10/2022: Ov with [name] for follow up after undergoing PFTs and HST. She was found to have a restrictive defect and minimal diffusion defect on her pulmonary function testing. Her home sleep study revealed mildly moderate OSA. She has never been treated for this with CPAP before.  Today, she tells me that she was doing much better after she started the [facility] inhaler. Over the past few days (starting approx 3/21), she developed sinus congestion, sneezing, watery eyes. She thought it was allergies. She now has a cough and is feeling like her chest is a little tighter, especially when she lays back. She also notices more wheezing at night. She feels like she gets more short winded with activities around the house but doesn't feel like her shortness of breath is severe. She denies any fevers,  chills, hemoptysis, leg swelling. She is using her Breztri twice a day. Increased use of albuterol over the past few days. She is not currently on anything for allergies.  Regarding her sleep, she feels very tired during the day. She snores at night. She denies any drowsy driving or morning headaches. She would like to know what treatment options there are.   02/01/2023: OV with Soraiya Ahner James for follow up.  She was recently hospitalized from 12/28/2022-12/30/2022 for pneumonia treated with cefdinir and azithromycin.  Prior to this she had been seen in the emergency department 12/25/2022 for myalgias/arthralgias.  She had, had tick bites prior to this and had been on doxycycline.  She was provided with Norco and discharged home.  She also had viral testing that was negative. She tells me today that she is finally starting to feel better.  She is still having some more shortness of breath compared to her baseline and she does not feel like her stamina is quite back to where it was before.  She does still have an occasional cough but it is primarily dry.  Feels like it is improving.  She has not noticed quite as much wheezing.  Denies any fevers, chills, hemoptysis.  Myalgias and arthralgias are better as well.  She is still using her nebs twice a day.  She is been able to back off from 4 times a day.  Still on Breztri twice a day.  Is having difficulties with her taste and smell.  Does not feel like she can taste much of  anything and she is not able to smell her candles like she usually is.  She did test negative for COVID so she is not sure why this is.  Does have some sinus symptoms which do not feel much worse than her baseline.  She was using Flonase a few times a day but then it made her nosebleed. She has been having trouble with her CPAP due to not feeling like she is getting enough air.  She says that she thought this was related to recovering from pneumonia but then she also thinks that it might be a mass problem.   She is used a couple different masks.  Currently settled on nasal pillow which she still does not always feel it gives her enough air at times.  She tends to have to open her mouth to breathe more easily.  She denies any drowsy driving or morning headaches. 01/01/2023-01/30/2023: CPAP auto 5-15 cmH2O 13/30 days; 17% > 4 hours; average use 3 hours 28 minutes Pressure 95th 10.1 Leaks 95th 8.2 AHI 1.9  03/09/2023: Today - follow up Patient presents today for follow-up.  Has been doing better since she was here last.  She does still have some occasional shortness of breath and sometimes feels like her breath catches.  Notices it more so when she is outside and it is hot.  Cough is mostly gone.  Not noticing any wheezing or chest congestion.  She did have an RA flare and is currently on prednisone for this.  Denies any fevers, chills, hemoptysis, lower extremity swelling, orthopnea.  She is on Breztri twice a day.  Taste and smell is getting better.  She has been using Flonase, which helped her sinus symptoms but she does occasionally still have some postnasal drainage.  She feels like this is primarily her allergies that bother her.  She is taking a daily allergy pill which does help. She feels like she has been doing better with her CPAP.  Wearing it most nights.  Occasionally will wake up with it off.  She has been getting calls from [employer] about not wearing her CPAP enough.  She is confused about this because she has been wearing it more consistently.  She was in the hospital before so she is not sure if this is the compliance problem they are talking about.  Denies any drowsy driving or morning headaches.  She does receive benefit from using her CPAP.  02/07/2023-03/08/2023: CPAP 8-20 cmH2O 29/30 days; 83% >4 hr; average use 6 hr 28 min Pressure 95th 15 Leaks 12.8 AHI 2.3   Allergies  Allergen Reactions   Peanut-Containing Drug Products Anaphylaxis    Swelling    Tegretol [Carbamazepine] Shortness  Of Breath and Swelling   Beef Allergy     Does not eat red meat   Lithium Other (See Comments)    Bad acne    Pork-Derived Products Other (See Comments)    Does not eat pork products   Sweet Potato Itching   Penicillins Rash    There is no immunization history for the selected administration types on file for this patient.  Past Medical History:  Diagnosis Date   Anemia    Anxiety    Arthritis    orthopedic   Bipolar disorder (HCC)    Depression    Fibromyalgia    GERD (gastroesophageal reflux disease)    Hypotension    IBS (irritable bowel syndrome)    Migraine    PTSD (post-traumatic stress disorder)  on prazosin   Sleep apnea    Tonsillar hypertrophy    Uterine fibroid     Tobacco History: Social History   Tobacco Use  Smoking Status Never  Smokeless Tobacco Never   Counseling given: Not Answered   Outpatient Medications Prior to Visit  Medication Sig Dispense Refill   acetaminophen (TYLENOL) 325 MG tablet Take 2 tablets (650 mg total) by mouth every 6 (six) hours as needed for mild pain (or Fever >/= 101). 100 tablet 0   albuterol (PROVENTIL) (2.5 MG/3ML) 0.083% nebulizer solution Take 3 mLs (2.5 mg total) by nebulization every 4 (four) hours as needed for wheezing or shortness of breath. 75 mL 2   albuterol (VENTOLIN HFA) 108 (90 Base) MCG/ACT inhaler Inhale 2 puffs into the lungs every 4 (four) hours as needed for wheezing or shortness of breath. 18 g 2   allopurinol (ZYLOPRIM) 300 MG tablet Take 300 mg by mouth daily.     ALPRAZolam (XANAX) 0.5 MG tablet Take 0.5 mg by mouth 2 (two) times daily as needed for anxiety.     atorvastatin (LIPITOR) 40 MG tablet Take 40 mg by mouth daily.     Budeson-Glycopyrrol-Formoterol (BREZTRI AEROSPHERE) 160-9-4.8 MCG/ACT AERO Inhale 2 puffs into the lungs in the morning and at bedtime. 10.7 g 3   cholecalciferol (VITAMIN D3) 25 MCG (1000 UNIT) tablet Take 4,000 Units by mouth daily.     colchicine 0.6 MG tablet Take 0.6  mg by mouth daily.     diclofenac Sodium (VOLTAREN) 1 % GEL Apply 1 Application topically 4 (four) times daily.     DULoxetine (CYMBALTA) 60 MG capsule Take 60 mg by mouth daily.     folic acid (FOLVITE) 1 MG tablet Take 1 mg by mouth daily.     furosemide (LASIX) 20 MG tablet Take 1 tablet (20 mg total) by mouth daily. 30 tablet 1   HYDROcodone-acetaminophen (NORCO) 5-325 MG tablet Take 1 tablet by mouth every 6 (six) hours as needed for moderate pain. 6 tablet 0   hydrOXYzine (ATARAX) 50 MG tablet Take 0.5 tablets (25 mg total) by mouth 2 (two) times daily as needed for itching or anxiety. 30 tablet 3   ipratropium-albuterol (DUONEB) 0.5-2.5 (3) MG/3ML SOLN Take 3 mLs by nebulization every 6 (six) hours as needed. 360 mL 3   LATUDA 40 MG TABS tablet Take 40 mg by mouth at bedtime.     levocetirizine (XYZAL) 5 MG tablet take 1 tablet (5 MILLIGRAM total) by mouth daily. 30 tablet 11   metFORMIN (GLUCOPHAGE) 500 MG tablet Take 1 tablet (500 mg total) by mouth daily with breakfast. 90 tablet 3   methotrexate 2.5 MG tablet Take 25 mg by mouth once a week. Caution:Chemotherapy. Protect from light.  Takes 10 per day once a week.     mirabegron ER (MYRBETRIQ) 25 MG TB24 tablet Take 25 mg by mouth daily.     montelukast (SINGULAIR) 10 MG tablet Take 1 tablet (10 mg total) by mouth at bedtime. 30 tablet 11   MOUNJARO 5 MG/0.5ML Pen Inject 5 mg into the skin once a week.     Oxcarbazepine (TRILEPTAL) 300 MG tablet Take 1 tablet (300 mg total) by mouth 2 (two) times daily. (Patient taking differently: Take 600 mg by mouth at bedtime.) 60 tablet 2   prazosin (MINIPRESS) 5 MG capsule Take 5 mg by mouth 3 (three) times daily. @ Bedtime     pregabalin (LYRICA) 75 MG capsule Take 1 capsule by mouth  daily.     promethazine (PHENERGAN) 25 MG tablet Take 25 mg by mouth every 6 (six) hours as needed.     vitamin B-12 (CYANOCOBALAMIN) 100 MCG tablet Take 100 mcg by mouth daily.     pantoprazole (PROTONIX) 40 MG  tablet Take 1 tablet (40 mg total) by mouth daily. 30 tablet 1   No facility-administered medications prior to visit.     Review of Systems:   Constitutional: No weight loss or gain, night sweats, fevers, chills, lassitude. +fatigue,  HEENT: No headaches, difficulty swallowing, tooth/dental problems, or sore throat. No sneezing, itching, ear ache. +nasal congestion, loss of taste and smell (improving) CV:  No chest pain, orthopnea, PND, swelling in lower extremities, anasarca, dizziness, palpitations, syncope Resp: +shortness of breath with exertion. No excess mucus or change in color of mucus.  No hemoptysis. No wheezing.  No chest wall deformity GI:  No heartburn, indigestion GU: No dysuria, change in color of urine, urgency or frequency.   Skin: No rash, lesions, ulcerations MSK:  No increased joint pain or swelling.   Neuro: No dizziness or lightheadedness.  Psych: No depression or anxiety. Mood stable.     Physical Exam:  There were no vitals taken for this visit.  GEN: Pleasant, interactive, well-appearing; obese; in no acute distress. HEENT:  Normocephalic and atraumatic. PERRLA. Sclera white. Nasal turbinates pink, moist and patent bilaterally. No rhinorrhea present. Oropharynx pink and moist, without exudate or edema. No lesions, ulcerations, or postnasal drip.  NECK:  Supple w/ fair ROM. No JVD present. Normal carotid impulses w/o bruits. Thyroid symmetrical with no goiter or nodules palpated. No lymphadenopathy.   CV: RRR, no m/r/g, no peripheral edema. Pulses intact, +2 bilaterally. No cyanosis, pallor or clubbing. PULMONARY:  Unlabored, regular breathing. Clear bilaterally A&P w/o wheezes/rales/rhonchi. No accessory muscle use.  GI: BS present and normoactive. Soft, non-tender to palpation. No organomegaly or masses detected.  MSK: No erythema, warmth or tenderness. Cap refil <2 sec all extrem. No deformities or joint swelling noted.  Neuro: A/Ox3. No focal deficits  noted.   Skin: Warm, no lesions or rashe Psych: Normal affect and behavior. Judgement and thought content appropriate.     Lab Results:  CBC    Component Value Date/Time   WBC 10.6 (H) 12/30/2022 0438   RBC 3.79 (L) 12/30/2022 0438   HGB 11.0 (L) 12/30/2022 0438   HCT 34.4 (L) 12/30/2022 0438   PLT 281 12/30/2022 0438   MCV 90.8 12/30/2022 0438   MCH 29.0 12/30/2022 0438   MCHC 32.0 12/30/2022 0438   RDW 16.0 (H) 12/30/2022 0438   LYMPHSABS 1.7 12/29/2022 0554   MONOABS 1.3 (H) 12/29/2022 0554   EOSABS 0.0 12/29/2022 0554   BASOSABS 0.1 12/29/2022 0554    BMET    Component Value Date/Time   NA 133 (L) 12/30/2022 0438   K 4.5 12/30/2022 0438   CL 102 12/30/2022 0438   CO2 23 12/30/2022 0438   GLUCOSE 196 (H) 12/30/2022 0438   BUN 13 12/30/2022 0438   CREATININE 0.68 12/30/2022 0438   CALCIUM 9.4 12/30/2022 0438   GFRNONAA >60 12/30/2022 0438   GFRAA >60 03/29/2020 1009    BNP    Component Value Date/Time   BNP 13.0 12/28/2022 2019     Imaging:  No results found.  Administration History     None          Latest Ref Rng & Units 10/06/2022    2:23 PM  PFT Results  FVC-Pre  L 2.63   FVC-Predicted Pre % 65   FVC-Post L 2.64   FVC-Predicted Post % 65   Pre FEV1/FVC % % Jordan   Post FEV1/FCV % % 88   FEV1-Pre L 2.20   FEV1-Predicted Pre % 68   FEV1-Post L 2.33   DLCO uncorrected ml/min/mmHg 17.40   DLCO UNC% % 72   DLCO corrected ml/min/mmHg 17.40   DLCO COR %Predicted % 72   DLVA Predicted % 116   TLC L 4.18   TLC % Predicted % 74   RV % Predicted % 76     No results found for: "NITRICOXIDE"      Assessment & Plan:   No problem-specific Assessment & Plan notes found for this encounter.     I spent 35 minutes of dedicated to the care of this patient on the date of this encounter to include pre-visit review of records, face-to-face time with the patient discussing conditions above, post visit ordering of testing, clinical documentation  with the electronic health record, making appropriate referrals as documented, and communicating necessary findings to members of the patients care team.  Noemi Chapel, James 06/07/2023  Pt aware and understands James's role.

## 2023-06-07 NOTE — Telephone Encounter (Signed)
I do not feel her thyroid is the main cause of her symptoms given her normal thyroid labs.  I suggest she reach back out to her pcp to discuss strategies to help her itching.

## 2023-06-08 DIAGNOSIS — M25549 Pain in joints of unspecified hand: Secondary | ICD-10-CM | POA: Diagnosis not present

## 2023-06-08 DIAGNOSIS — M069 Rheumatoid arthritis, unspecified: Secondary | ICD-10-CM | POA: Diagnosis not present

## 2023-06-08 DIAGNOSIS — D849 Immunodeficiency, unspecified: Secondary | ICD-10-CM | POA: Diagnosis not present

## 2023-06-08 DIAGNOSIS — K219 Gastro-esophageal reflux disease without esophagitis: Secondary | ICD-10-CM | POA: Diagnosis not present

## 2023-06-08 NOTE — Telephone Encounter (Signed)
Patient was called and made aware. She will follow up with her PCP.

## 2023-06-12 DIAGNOSIS — Z299 Encounter for prophylactic measures, unspecified: Secondary | ICD-10-CM | POA: Diagnosis not present

## 2023-06-12 DIAGNOSIS — M069 Rheumatoid arthritis, unspecified: Secondary | ICD-10-CM | POA: Diagnosis not present

## 2023-06-12 DIAGNOSIS — M797 Fibromyalgia: Secondary | ICD-10-CM | POA: Diagnosis not present

## 2023-06-12 DIAGNOSIS — G8929 Other chronic pain: Secondary | ICD-10-CM | POA: Diagnosis not present

## 2023-06-14 ENCOUNTER — Ambulatory Visit (HOSPITAL_COMMUNITY)
Admission: RE | Admit: 2023-06-14 | Discharge: 2023-06-14 | Disposition: A | Discharge status: Home / Self Care | Payer: 59 | Source: Ambulatory Visit | Attending: Nurse Practitioner | Admitting: Nurse Practitioner

## 2023-06-14 ENCOUNTER — Encounter (HOSPITAL_COMMUNITY): Payer: Self-pay

## 2023-06-14 DIAGNOSIS — E041 Nontoxic single thyroid nodule: Secondary | ICD-10-CM | POA: Insufficient documentation

## 2023-06-14 MED ORDER — LIDOCAINE HCL (PF) 2 % IJ SOLN
INTRAMUSCULAR | Status: AC
  Filled 2023-06-14: qty 20

## 2023-06-14 MED ORDER — LIDOCAINE HCL (PF) 2 % IJ SOLN
10.0000 mL | Freq: Once | INTRAMUSCULAR | Status: AC
  Administered 2023-06-14: 10 mL

## 2023-06-14 NOTE — Progress Notes (Signed)
PT tolerated thyroid biopsy procedure well today. Labs and afirma obtained and sent for pathology by Richard from ultrasound. PT ambulatory at discharge with no acute distress noted and verbalized understanding of discharge instructions.

## 2023-06-19 ENCOUNTER — Other Ambulatory Visit: Payer: Self-pay | Admitting: Nurse Practitioner

## 2023-06-19 ENCOUNTER — Encounter: Payer: Self-pay | Admitting: Nurse Practitioner

## 2023-06-19 DIAGNOSIS — E041 Nontoxic single thyroid nodule: Secondary | ICD-10-CM

## 2023-06-19 LAB — CYTOLOGY - NON PAP

## 2023-06-19 NOTE — Progress Notes (Signed)
Schedule follow up in 1 year with labs and repeat ultrasound.

## 2023-06-22 DIAGNOSIS — M25561 Pain in right knee: Secondary | ICD-10-CM | POA: Diagnosis not present

## 2023-06-22 DIAGNOSIS — Z299 Encounter for prophylactic measures, unspecified: Secondary | ICD-10-CM | POA: Diagnosis not present

## 2023-06-22 DIAGNOSIS — M25562 Pain in left knee: Secondary | ICD-10-CM | POA: Diagnosis not present

## 2023-06-22 DIAGNOSIS — M069 Rheumatoid arthritis, unspecified: Secondary | ICD-10-CM | POA: Diagnosis not present

## 2023-06-22 DIAGNOSIS — D849 Immunodeficiency, unspecified: Secondary | ICD-10-CM | POA: Diagnosis not present

## 2023-06-23 LAB — COMPREHENSIVE METABOLIC PANEL
Calcium: 9.9
EGFR: 69

## 2023-06-30 NOTE — Therapy (Incomplete)
OUTPATIENT PHYSICAL THERAPY LOWER EXTREMITY EVALUATION   Patient Name: [name] MRN: [id number] DOB:01/18/1971, 52 y.o., female Today's Date: 06/30/2023  END OF SESSION:   Past Medical History:  Diagnosis Date   Anemia    Anxiety    Arthritis    orthopedic   Bipolar disorder (HCC)    Depression    Fibromyalgia    GERD (gastroesophageal reflux disease)    Hypotension    IBS (irritable bowel syndrome)    Migraine    PTSD (post-traumatic stress disorder)    on prazosin   Sleep apnea    Tonsillar hypertrophy    Uterine fibroid    Past Surgical History:  Procedure Laterality Date   ENDOMETRIAL ABLATION     TONSILLECTOMY AND ADENOIDECTOMY N/A 02/13/2018   Procedure: TONSILLECTOMY AND ADENOIDECTOMY;  Surgeon: Newman Pies, MD;  Location: [facility];  Service: ENT;  Laterality: N/A;   TUBAL LIGATION     Patient Active Problem List   Diagnosis Date Noted   CAP (community acquired pneumonia) 12/29/2022   Essential hypertension 12/29/2022   Hypokalemia 12/29/2022   Pneumonia 12/29/2022   Mild obstructive sleep apnea 10/12/2022   Allergic rhinitis 10/12/2022   Respiratory syncytial virus (RSV) infection 05/25/2022   Gastroesophageal reflux disease 05/25/2022   Depression 05/25/2022   Rheumatoid arthritis (HCC) 05/25/2022   Impaired glucose tolerance 05/24/2022   Severe persistent asthma 05/23/2022   Hyperglycemia 05/23/2022   Mixed hyperlipidemia 05/23/2022   Uterine cramping 09/05/2016   Moderate mixed bipolar I disorder (HCC) 11/07/2013    PCP: Kirstie Peri, MD  REFERRING PROVIDER: Kirstie Peri, MD  REFERRING DIAG: bilateral knee pain  THERAPY DIAG:  No diagnosis found.  Rationale for Evaluation and Treatment: Rehabilitation  ONSET DATE: ***  SUBJECTIVE:   SUBJECTIVE STATEMENT: ***  PERTINENT HISTORY: *** PAIN:  Are you having pain? {OPRCPAIN:27236}  PRECAUTIONS: {Therapy precautions:24002}  RED FLAGS: {PT Red  Flags:29287}   WEIGHT BEARING RESTRICTIONS: {Yes ***/No:24003}  FALLS:  Has patient fallen in last 6 months? {fallsyesno:27318}  LIVING ENVIRONMENT: Lives with: {OPRC lives with:25569::"lives with their family"} Lives in: {Lives in:25570} Stairs: {opstairs:27293} Has following equipment at home: {Assistive devices:23999}  OCCUPATION: ***  PLOF: {PLOF:24004}  PATIENT GOALS: ***  NEXT MD VISIT: ***  OBJECTIVE:  Note: Objective measures were completed at Evaluation unless otherwise noted.  DIAGNOSTIC FINDINGS: ***  PATIENT SURVEYS:  LEFS ***  COGNITION: Overall cognitive status: {cognition:24006}     SENSATION: {sensation:27233}  EDEMA:  {edema:24020}  MUSCLE LENGTH: Hamstrings: Right *** deg; Left *** deg Maisie Fus test: Right *** deg; Left *** deg  POSTURE: {posture:25561}  PALPATION: ***  LOWER EXTREMITY ROM:  {AROM/PROM:27142} ROM Right eval Left eval  Hip flexion    Hip extension    Hip abduction    Hip adduction    Hip internal rotation    Hip external rotation    Knee flexion    Knee extension    Ankle dorsiflexion    Ankle plantarflexion    Ankle inversion    Ankle eversion     (Blank rows = not tested)  LOWER EXTREMITY MMT:  MMT Right eval Left eval  Hip flexion    Hip extension    Hip abduction    Hip adduction    Hip internal rotation    Hip external rotation    Knee flexion    Knee extension    Ankle dorsiflexion    Ankle plantarflexion    Ankle inversion    Ankle eversion     (  Blank rows = not tested)  LOWER EXTREMITY SPECIAL TESTS:  {LEspecialtests:26242}  FUNCTIONAL TESTS:  {Functional tests:24029}  GAIT: Distance walked: *** Assistive device utilized: {Assistive devices:23999} Level of assistance: {Levels of assistance:24026} Comments: ***   TODAY'S TREATMENT:                                                                                                                              DATE: 07/03/2023 physical  therapy evaluation and HEP instruction    PATIENT EDUCATION:  Education details: Patient educated on exam findings, POC, scope of PT, HEP, and what to expect next visit . Person educated: Patient Education method: Explanation, Demonstration, and Handouts Education comprehension: verbalized understanding, returned demonstration, verbal cues required, and tactile cues required  HOME EXERCISE PROGRAM: ***  ASSESSMENT:  CLINICAL IMPRESSION: Patient is a 52 y.o. female who was seen today for physical therapy evaluation and treatment for bilateral knee pain.   OBJECTIVE IMPAIRMENTS: {opptimpairments:25111}.   ACTIVITY LIMITATIONS: {activitylimitations:27494}  PARTICIPATION LIMITATIONS: {participationrestrictions:25113}  PERSONAL FACTORS: {Personal factors:25162} are also affecting patient's functional outcome.   REHAB POTENTIAL: {rehabpotential:25112}  CLINICAL DECISION MAKING: {clinical decision making:25114}  EVALUATION COMPLEXITY: {Evaluation complexity:25115}   GOALS: Goals reviewed with patient? No  SHORT TERM GOALS: Target date: *** patient will be independent with initial HEP  Baseline: Goal status: INITIAL  2.  Patient will self report 30% improvement to improve tolerance for functional activity   Baseline:  Goal status: INITIAL  3.  *** Baseline:  Goal status: INITIAL  4.  *** Baseline:  Goal status: INITIAL  5.  *** Baseline:  Goal status: INITIAL  6.  *** Baseline:  Goal status: INITIAL  LONG TERM GOALS: Target date: ***  Patient will be independent in self management strategies to improve quality of life and functional outcomes.  Baseline:  Goal status: INITIAL  2.  Patient will self report 50% improvement to improve tolerance for functional activity   Baseline:  Goal status: INITIAL  3.  *** Baseline:  Goal status: INITIAL  4.  *** Baseline:  Goal status: INITIAL  5.  *** Baseline:  Goal status: INITIAL  6.  *** Baseline:   Goal status: INITIAL   PLAN:  PT FREQUENCY: {rehab frequency:25116}  PT DURATION: {rehab duration:25117}  PLANNED INTERVENTIONS: 97164- PT Re-evaluation, 97110-Therapeutic exercises, 97530- Therapeutic activity, 97112- Neuromuscular re-education, 97535- Self Care, 62130- Manual therapy, [id number]- Gait training, [phone]- Orthotic Fit/training, [phone]- Canalith repositioning, [id number]- Aquatic Therapy, [id number]- Splinting, Patient/Family education, Balance training, Stair training, Taping, Dry Needling, Joint mobilization, Joint manipulation, Spinal manipulation, Spinal mobilization, Scar mobilization, and DME instructions.   PLAN FOR NEXT SESSION: Review HEP and goals; knee mobility and strength   8:57 AM, 06/30/23 Lonnell Chaput Small Hether Anselmo MPT [facility]  [phone]

## 2023-07-03 ENCOUNTER — Ambulatory Visit (HOSPITAL_COMMUNITY): Payer: 59

## 2023-07-18 ENCOUNTER — Other Ambulatory Visit: Payer: Self-pay

## 2023-07-18 ENCOUNTER — Ambulatory Visit (HOSPITAL_COMMUNITY): Payer: 59 | Attending: Internal Medicine

## 2023-07-18 DIAGNOSIS — R262 Difficulty in walking, not elsewhere classified: Secondary | ICD-10-CM | POA: Insufficient documentation

## 2023-07-18 DIAGNOSIS — M6281 Muscle weakness (generalized): Secondary | ICD-10-CM | POA: Diagnosis not present

## 2023-07-18 DIAGNOSIS — M25561 Pain in right knee: Secondary | ICD-10-CM | POA: Diagnosis not present

## 2023-07-18 NOTE — Therapy (Signed)
 [facility] PH[facility]Y EVALUATION (LOWER EXTREMITY)   Patient Name: [name] MRN: [id number] DOB:1971-07-13, 52 y.o., female Today's Date: 07/18/2023  END OF SESSION:   PT End of Session - 07/18/23 1440     Visit Number 1    Number of Visits 6    Authorization Type [employer] Dual Complete    PT Start Time 0230    PT Stop Time 0310    PT Time Calculation (min) 40 min    Activity Tolerance Patient tolerated treatment well    Behavior During Therapy WFL for tasks assessed/performed              Past Medical History:  Diagnosis Date   Anemia    Anxiety    Arthritis    orthopedic   Bipolar disorder (HCC)    Depression    Fibromyalgia    GERD (gastroesophageal reflux disease)    Hypotension    IBS (irritable bowel syndrome)    Migraine    PTSD (post-traumatic stress disorder)    on prazosin    Sleep apnea    Tonsillar hypertrophy    Uterine fibroid    Past Surgical History:  Procedure Laterality Date   ENDOMETRIAL ABLATION     TONSILLECTOMY AND ADENOIDECTOMY N/A 02/13/2018   Procedure: TONSILLECTOMY AND ADENOIDECTOMY;  Surgeon: Karis Clunes, MD;  Location: [facility];  Service: ENT;  Laterality: N/A;   TUBAL LIGATION     Patient Active Problem List   Diagnosis Date Noted   CAP (community acquired pneumonia) 12/29/2022   Essential hypertension 12/29/2022   Hypokalemia 12/29/2022   Pneumonia 12/29/2022   Mild obstructive sleep apnea 10/12/2022   Allergic rhinitis 10/12/2022   Respiratory syncytial virus (RSV) infection 05/25/2022   Gastroesophageal reflux disease 05/25/2022   Depression 05/25/2022   Rheumatoid arthritis (HCC) 05/25/2022   Impaired glucose tolerance 05/24/2022   Severe persistent asthma 05/23/2022   Hyperglycemia 05/23/2022   Mixed hyperlipidemia 05/23/2022   Uterine cramping 09/05/2016   Moderate mixed bipolar I disorder (HCC) 11/07/2013    PCP: Maree Isles, MDRef Provider (PCP)   REFERRING PROVIDER:   Maree Isles, MD    REFERRING DIAG: bilateral knee pain   Rationale for Evaluation and Treatment: Rehabilitation  THERAPY DIAG:  Difficulty in walking, not elsewhere classified  Right knee pain, unspecified chronicity  Muscle weakness (generalized)  ONSET DATE: 1 year ago+    SUBJECTIVE:  SUBJECTIVE STATEMENT: Patient states her knee pain began 1 year ago; noticed increased increased buckling in the right knee in the right knee. Patient received cortisone shot to both knees 1 year ago. Patient went to her PCP December 1 who put her on prednisone , 3 days left. Patient referred to OPPT. Patient has lost weight from 257 to 232.    PERTINENT HISTORY:  RA diagnosed 2 years ago Fibromyalgia dx 3 years ago  On disability since 2005  PAIN:  Are you having pain? Yes: NPRS scale: 4/10 Pain location: right knee  Pain description: sharp/ buckling  Aggravating factors: stairs, turning  Relieving factors: rest, NSAIDS   PRECAUTIONS: None  RED FLAGS: None   WEIGHT BEARING RESTRICTIONS: No  FALLS:  Has patient fallen in last 6 months? Yes. Number of falls 2x  LIVING ENVIRONMENT: Lives with: lives with their family Lives in: House/apartment Stairs: Yes: Internal: 4 steps; can reach both and External: 6 steps; bilateral but cannot reach both Has following equipment at home: Single point cane  OCCUPATION: disability since 2005; homemaker   PLOF: Independent  PATIENT GOALS: To improve overall mobility   NEXT MD VISIT: TBD     OBJECTIVE:   DIAGNOSTIC FINDINGS:    PATIENT SURVEYS:  LEFS 30/80   SCREENING FOR RED FLAGS: Bowel or bladder incontinence: No Spinal tumors: No Cauda equina syndrome: No Compression fracture: No Abdominal aneurysm: No  COGNITION: Overall cognitive status: Within  functional limits for tasks assessed  POSTURE: No Significant postural limitations      FUNCTIONAL TESTS:  5 times sit to stand: next session    GAIT ANALYSIS: Distance walked: 25 ft Assistive device utilized: Single point cane Level of assistance: Complete Independence Comments: moderate antalgia   SENSATION: WFL    LOWER EXTREMITY MMT:    MMT Right eval Left eval  Hip flexion    Hip extension    Hip abduction    Hip adduction    Hip internal rotation    Hip external rotation    Knee flexion    Knee extension    Ankle dorsiflexion    Ankle plantarflexion    Ankle inversion    Ankle eversion     (Blank rows = not tested)  LOWER EXTREMITY ROM:     Active  Right eval Left eval  Hip flexion    Hip extension    Hip abduction    Hip adduction    Hip internal rotation    Hip external rotation    Knee flexion 0-90 pain   Knee extension    Ankle dorsiflexion    Ankle plantarflexion    Ankle inversion    Ankle eversion     (Blank rows = not tested)  LOWER EXTREMITY SPECIAL TESTS   Anterior drawer test: negative, Posterior drawer test: negative, Apley's test: negative, Apley's compression test: negative, and Apley's distraction test: negative     PALPATION: Moderate tenderness to palpation right patella    TODAY'S TREATMENT:  DATE:   07/18/23 PT initial Eval    PATIENT EDUCATION:  Education details: activity modification  Person educated: Patient Education method: Explanation Education comprehension: verbalized understanding  HOME EXERCISE PROGRAM: TBD    ASSESSMENT:  CLINICAL IMPRESSION: Patient is a 52 y.o. y.o. female who was seen today for physical therapy evaluation and treatment for knee pain, walking issues. Patient presents to PT with the following objective impairments: Abnormal gait, decreased endurance,  decreased mobility, difficulty walking, decreased ROM, decreased strength, postural dysfunction, and pain. These impairments limit the patient in activities such as carrying, lifting, bending, standing, squatting, stairs, locomotion level, and caring for others. These impairments also limit the patient in participation such as meal prep, cleaning, laundry, shopping, and yard work. The patient will benefit from PT to address the limitations/impairments listed below to return to their prior level of function in the domains of activity and participation.    PERSONAL FACTORS: 1-2 comorbidities: fibromyalgia, RA   are also affecting patient's functional outcome.   REHAB POTENTIAL: Fair    CLINICAL DECISION MAKING: Stable/uncomplicated  EVALUATION COMPLEXITY: Moderate     GOALS: Goals reviewed with patient? No  SHORT TERM GOALS: Target date: 08/08/2023     1. Patient will be independent with a basic stretching/strengthening HEP  Baseline:  Goal status: INITIAL   LONG TERM GOALS: Target date: 08/29/2023    Patient will score a >/= 40 on the LEFS    to demonstrate a Minimally Clinically Importance Difference (MCID) in ADL completion, home/community ambulation, and lifting/bending/squatting. Baseline:  Goal status: INITIAL    2.  Patient will be independent with a comprehensive strengthening HEP  Baseline:  Goal status: INITIAL  3. Patient will be able to demonstrate FULL right knee active range of motion to facilitate ADL completion, lifting, squatting. Baseline:  Goal status: INITIAL    PLAN:  PT FREQUENCY: 1x/week  PT DURATION: 6 weeks  PLANNED INTERVENTIONS: 97110-Therapeutic exercises, 97530- Therapeutic activity, 97112- Neuromuscular re-education, 97535- Self Care, [id number]- Manual therapy, [id number]- Gait training, Balance training, Stair training, Taping, Dry Needling, Joint mobilization, Joint manipulation, Spinal manipulation, Spinal mobilization, Cryotherapy, and Moist  heat.  PLAN FOR NEXT SESSION: progress lower extremity strength/ add HEP   Deward Ming, PT 07/18/2023, 4:21 PM

## 2023-07-25 ENCOUNTER — Ambulatory Visit (HOSPITAL_COMMUNITY): Payer: 59 | Attending: Internal Medicine

## 2023-07-25 DIAGNOSIS — M6281 Muscle weakness (generalized): Secondary | ICD-10-CM | POA: Insufficient documentation

## 2023-07-25 DIAGNOSIS — M25561 Pain in right knee: Secondary | ICD-10-CM | POA: Insufficient documentation

## 2023-07-25 DIAGNOSIS — E1169 Type 2 diabetes mellitus with other specified complication: Secondary | ICD-10-CM | POA: Diagnosis not present

## 2023-07-25 DIAGNOSIS — M069 Rheumatoid arthritis, unspecified: Secondary | ICD-10-CM | POA: Diagnosis not present

## 2023-07-25 DIAGNOSIS — R262 Difficulty in walking, not elsewhere classified: Secondary | ICD-10-CM | POA: Diagnosis not present

## 2023-07-25 DIAGNOSIS — G8929 Other chronic pain: Secondary | ICD-10-CM | POA: Diagnosis not present

## 2023-07-25 NOTE — Therapy (Signed)
 [facility] EVALUATION (LOWER EXTREMITY)   Patient Name: [name] MRN: [id number] DOB:1970/10/21, 53 y.o., female Today's Date: 07/25/2023  END OF SESSION:   PT End of Session - 07/25/23 1436     Visit Number 2    Number of Visits 6    Authorization Type [employer] Dual Complete    PT Start Time 0232    PT Stop Time 0312    PT Time Calculation (min) 40 min    Activity Tolerance Patient tolerated treatment well    Behavior During Therapy WFL for tasks assessed/performed              Past Medical History:  Diagnosis Date   Anemia    Anxiety    Arthritis    orthopedic   Bipolar disorder (HCC)    Depression    Fibromyalgia    GERD (gastroesophageal reflux disease)    Hypotension    IBS (irritable bowel syndrome)    Migraine    PTSD (post-traumatic stress disorder)    on prazosin    Sleep apnea    Tonsillar hypertrophy    Uterine fibroid    Past Surgical History:  Procedure Laterality Date   ENDOMETRIAL ABLATION     TONSILLECTOMY AND ADENOIDECTOMY N/A 02/13/2018   Procedure: TONSILLECTOMY AND ADENOIDECTOMY;  Surgeon: Karis Clunes, MD;  Location: [facility];  Service: ENT;  Laterality: N/A;   TUBAL LIGATION     Patient Active Problem List   Diagnosis Date Noted   CAP (community acquired pneumonia) 12/29/2022   Essential hypertension 12/29/2022   Hypokalemia 12/29/2022   Pneumonia 12/29/2022   Mild obstructive sleep apnea 10/12/2022   Allergic rhinitis 10/12/2022   Respiratory syncytial virus (RSV) infection 05/25/2022   Gastroesophageal reflux disease 05/25/2022   Depression 05/25/2022   Rheumatoid arthritis (HCC) 05/25/2022   Impaired glucose tolerance 05/24/2022   Severe persistent asthma 05/23/2022   Hyperglycemia 05/23/2022   Mixed hyperlipidemia 05/23/2022   Uterine cramping 09/05/2016   Moderate mixed bipolar I disorder (HCC) 11/07/2013    PCP: Maree Isles, MDRef Provider (PCP)   REFERRING PROVIDER:   Maree Isles, MD    REFERRING DIAG: bilateral knee pain   Rationale for Evaluation and Treatment: Rehabilitation  THERAPY DIAG:  Difficulty in walking, not elsewhere classified  Right knee pain, unspecified chronicity  Muscle weakness (generalized)  ONSET DATE: 1 year ago+    SUBJECTIVE:  SUBJECTIVE STATEMENT: Patient states her knee pain began 1 year ago; noticed increased increased buckling in the right knee in the right knee. Patient received cortisone shot to both knees 1 year ago. Patient went to her PCP December 1 who put her on prednisone , 3 days left. Patient referred to OPPT. Patient has lost weight from 257 to 232.    PERTINENT HISTORY:  RA diagnosed 2 years ago Fibromyalgia dx 3 years ago  On disability since 2005  PAIN:  Are you having pain? Yes: NPRS scale: 4/10 Pain location: right knee  Pain description: sharp/ buckling  Aggravating factors: stairs, turning  Relieving factors: rest, NSAIDS   PRECAUTIONS: None  RED FLAGS: None   WEIGHT BEARING RESTRICTIONS: No  FALLS:  Has patient fallen in last 6 months? Yes. Number of falls 2x  LIVING ENVIRONMENT: Lives with: lives with their family Lives in: House/apartment Stairs: Yes: Internal: 4 steps; can reach both and External: 6 steps; bilateral but cannot reach both Has following equipment at home: Single point cane  OCCUPATION: disability since 2005; homemaker   PLOF: Independent  PATIENT GOALS: To improve overall mobility   NEXT MD VISIT: TBD     OBJECTIVE:   DIAGNOSTIC FINDINGS:    PATIENT SURVEYS:  LEFS 30/80   SCREENING FOR RED FLAGS: Bowel or bladder incontinence: No Spinal tumors: No Cauda equina syndrome: No Compression fracture: No Abdominal aneurysm: No  COGNITION: Overall cognitive status: Within  functional limits for tasks assessed  POSTURE: No Significant postural limitations      FUNCTIONAL TESTS:  5 times sit to stand: next session    GAIT ANALYSIS: Distance walked: 25 ft Assistive device utilized: Single point cane Level of assistance: Complete Independence Comments: moderate antalgia   SENSATION: WFL    LOWER EXTREMITY MMT:    MMT Right eval Left eval  Hip flexion 3-/5 3-/5  Hip extension    Hip abduction    Hip adduction    Hip internal rotation    Hip external rotation    Knee flexion 3+/5 3+/5  Knee extension    Ankle dorsiflexion    Ankle plantarflexion    Ankle inversion    Ankle eversion     (Blank rows = not tested)  LOWER EXTREMITY ROM:     Active  Right eval Left eval  Hip flexion    Hip extension    Hip abduction    Hip adduction    Hip internal rotation    Hip external rotation    Knee flexion 0-90 pain   Knee extension    Ankle dorsiflexion    Ankle plantarflexion    Ankle inversion    Ankle eversion     (Blank rows = not tested)  LOWER EXTREMITY SPECIAL TESTS   Anterior drawer test: negative, Posterior drawer test: negative, Apley's test: negative, Apley's compression test: negative, and Apley's distraction test: negative     PALPATION: Moderate tenderness to palpation right patella    TODAY'S TREATMENT:  DATE:   07/25/23 Supine  Bilateral SAQs with 3# 2x10  Bilateral SLR 2x5  Bridges 2x10  Single leg bridges x10  07/18/23 PT initial Eval    PATIENT EDUCATION:  Education details: activity modification  Person educated: Patient Education method: Explanation Education comprehension: verbalized understanding  HOME EXERCISE PROGRAM: Access Code: [email or web address] URL: [email or web address] Date: 07/25/2023 Prepared by: Deward Ming  Exercises - Small Range Straight Leg  Raise  - 1-2 x daily - 2 sets - 10 reps - Supine Hip Flexion with Resistance Loop  - 1-2 x daily - 2 sets - 10 reps - Figure 4 Bridge  - 1-2 x daily - 2 sets - 10 reps    ASSESSMENT:  CLINICAL IMPRESSION: Today: PT progressed patient through lower extremity strengthening today. PT introduced basic lower extremity HEP to facilitate Quad/ HS/ Glut strength., During bridges, PT used manual contacts at anterior surface of ASIS to help patient use proper form into hip extension. Pt reported minimal discomfort with PT manual contact but then was able to perform bridge after with proper form. PT assigned basic HEP. Patient able to demo HEP well.  Patient will benefit from PT to return to prior level of function    Eval: Patient is a 53 y.o. y.o. female who was seen today for physical therapy evaluation and treatment for knee pain, walking issues. Patient presents to PT with the following objective impairments: Abnormal gait, decreased endurance, decreased mobility, difficulty walking, decreased ROM, decreased strength, postural dysfunction, and pain. These impairments limit the patient in activities such as carrying, lifting, bending, standing, squatting, stairs, locomotion level, and caring for others. These impairments also limit the patient in participation such as meal prep, cleaning, laundry, shopping, and yard work. The patient will benefit from PT to address the limitations/impairments listed below to return to their prior level of function in the domains of activity and participation.    PERSONAL FACTORS: 1-2 comorbidities: fibromyalgia, RA   are also affecting patient's functional outcome.   REHAB POTENTIAL: Fair    CLINICAL DECISION MAKING: Stable/uncomplicated  EVALUATION COMPLEXITY: Moderate     GOALS: Goals reviewed with patient? No  SHORT TERM GOALS: Target date: 08/08/2023     1. Patient will be independent with a basic stretching/strengthening HEP  Baseline:  Goal status:  INITIAL   LONG TERM GOALS: Target date: 08/29/2023    Patient will score a >/= 40 on the LEFS    to demonstrate a Minimally Clinically Importance Difference (MCID) in ADL completion, home/community ambulation, and lifting/bending/squatting. Baseline:  Goal status: INITIAL    2.  Patient will be independent with a comprehensive strengthening HEP  Baseline:  Goal status: INITIAL  3. Patient will be able to demonstrate FULL right knee active range of motion to facilitate ADL completion, lifting, squatting. Baseline:  Goal status: INITIAL    PLAN:  PT FREQUENCY: 1x/week  PT DURATION: 6 weeks  PLANNED INTERVENTIONS: 97110-Therapeutic exercises, 97530- Therapeutic activity, 97112- Neuromuscular re-education, 97535- Self Care, [id number]- Manual therapy, [phone]- Gait training, Balance training, Stair training, Taping, Dry Needling, Joint mobilization, Joint manipulation, Spinal manipulation, Spinal mobilization, Cryotherapy, and Moist heat.  PLAN FOR NEXT SESSION: progress lower extremity strength/ add HEP   Deward Ming, PT 07/25/2023, 2:36 PM

## 2023-07-28 ENCOUNTER — Telehealth: Payer: Self-pay | Admitting: *Deleted

## 2023-07-28 NOTE — Telephone Encounter (Signed)
 Patient was called to ask if she had reviewed the message sent by [facility] in [place], this was sent 06/06/23. Her voicemail is full and she cannot receive any messages at this time.

## 2023-08-01 ENCOUNTER — Ambulatory Visit (HOSPITAL_COMMUNITY): Payer: 59

## 2023-08-01 DIAGNOSIS — M25561 Pain in right knee: Secondary | ICD-10-CM

## 2023-08-01 DIAGNOSIS — M6281 Muscle weakness (generalized): Secondary | ICD-10-CM | POA: Diagnosis not present

## 2023-08-01 DIAGNOSIS — R262 Difficulty in walking, not elsewhere classified: Secondary | ICD-10-CM | POA: Diagnosis not present

## 2023-08-01 NOTE — Therapy (Signed)
 [facility] PHYS[facility] THER[facility]Y EVALUATION (LOWER EXTREMITY)   Patient Name: [name] MRN: [id number] DOB:1971-03-17, 53 y.o., female Today's Date: 08/01/2023  END OF SESSION:   PT End of Session - 08/01/23 1436     Visit Number 3    Number of Visits 6    Authorization Type [employer] Dual Complete    PT Start Time 0235    PT Stop Time 0315    PT Time Calculation (min) 40 min    Activity Tolerance Patient tolerated treatment well    Behavior During Therapy WFL for tasks assessed/performed              Past Medical History:  Diagnosis Date   Anemia    Anxiety    Arthritis    orthopedic   Bipolar disorder (HCC)    Depression    Fibromyalgia    GERD (gastroesophageal reflux disease)    Hypotension    IBS (irritable bowel syndrome)    Migraine    PTSD (post-traumatic stress disorder)    on prazosin    Sleep apnea    Tonsillar hypertrophy    Uterine fibroid    Past Surgical History:  Procedure Laterality Date   ENDOMETRIAL ABLATION     TONSILLECTOMY AND ADENOIDECTOMY N/A 02/13/2018   Procedure: TONSILLECTOMY AND ADENOIDECTOMY;  Surgeon: Karis Clunes, MD;  Location: [facility];  Service: ENT;  Laterality: N/A;   TUBAL LIGATION     Patient Active Problem List   Diagnosis Date Noted   CAP (community acquired pneumonia) 12/29/2022   Essential hypertension 12/29/2022   Hypokalemia 12/29/2022   Pneumonia 12/29/2022   Mild obstructive sleep apnea 10/12/2022   Allergic rhinitis 10/12/2022   Respiratory syncytial virus (RSV) infection 05/25/2022   Gastroesophageal reflux disease 05/25/2022   Depression 05/25/2022   Rheumatoid arthritis (HCC) 05/25/2022   Impaired glucose tolerance 05/24/2022   Severe persistent asthma 05/23/2022   Hyperglycemia 05/23/2022   Mixed hyperlipidemia 05/23/2022   Uterine cramping 09/05/2016   Moderate mixed bipolar I disorder (HCC) 11/07/2013    PCP: Maree Isles, MDRef Provider (PCP)   REFERRING PROVIDER:   Maree Isles, MD    REFERRING DIAG: bilateral knee pain   Rationale for Evaluation and Treatment: Rehabilitation  THERAPY DIAG:  Difficulty in walking, not elsewhere classified  Right knee pain, unspecified chronicity  Muscle weakness (generalized)  ONSET DATE: 1 year ago+    SUBJECTIVE:  SUBJECTIVE STATEMENT: Today: Patient with 3/10 knee pain today; less than usual   I.E. Patient states her knee pain began 1 year ago; noticed increased increased buckling in the right knee in the right knee. Patient received cortisone shot to both knees 1 year ago. Patient went to her PCP December 1 who put her on prednisone , 3 days left. Patient referred to OPPT. Patient has lost weight from 257 to 232.    PERTINENT HISTORY:  RA diagnosed 2 years ago Fibromyalgia dx 3 years ago  On disability since 2005  PAIN:  Are you having pain? Yes: NPRS scale: 4/10 Pain location: right knee  Pain description: sharp/ buckling  Aggravating factors: stairs, turning  Relieving factors: rest, NSAIDS   PRECAUTIONS: None  RED FLAGS: None   WEIGHT BEARING RESTRICTIONS: No  FALLS:  Has patient fallen in last 6 months? Yes. Number of falls 2x  LIVING ENVIRONMENT: Lives with: lives with their family Lives in: House/apartment Stairs: Yes: Internal: 4 steps; can reach both and External: 6 steps; bilateral but cannot reach both Has following equipment at home: Single point cane  OCCUPATION: disability since 2005; homemaker   PLOF: Independent  PATIENT GOALS: To improve overall mobility   NEXT MD VISIT: TBD     OBJECTIVE:   DIAGNOSTIC FINDINGS:    PATIENT SURVEYS:  LEFS 30/80   SCREENING FOR RED FLAGS: Bowel or bladder incontinence: No Spinal tumors: No Cauda equina syndrome: No Compression fracture:  No Abdominal aneurysm: No  COGNITION: Overall cognitive status: Within functional limits for tasks assessed  POSTURE: No Significant postural limitations      FUNCTIONAL TESTS:  5 times sit to stand: next session    GAIT ANALYSIS: Distance walked: 25 ft Assistive device utilized: Single point cane Level of assistance: Complete Independence Comments: moderate antalgia   SENSATION: WFL    LOWER EXTREMITY MMT:    MMT Right eval Left eval  Hip flexion 3-/5 3-/5  Hip extension    Hip abduction    Hip adduction    Hip internal rotation    Hip external rotation    Knee flexion 3+/5 3+/5  Knee extension    Ankle dorsiflexion    Ankle plantarflexion    Ankle inversion    Ankle eversion     (Blank rows = not tested)  LOWER EXTREMITY ROM:     Active  Right eval Left eval  Hip flexion    Hip extension    Hip abduction    Hip adduction    Hip internal rotation    Hip external rotation    Knee flexion 0-90 pain   Knee extension    Ankle dorsiflexion    Ankle plantarflexion    Ankle inversion    Ankle eversion     (Blank rows = not tested)  LOWER EXTREMITY SPECIAL TESTS   Anterior drawer test: negative, Posterior drawer test: negative, Apley's test: negative, Apley's compression test: negative, and Apley's distraction test: negative     PALPATION: Moderate tenderness to palpation right patella    TODAY'S TREATMENT:  DATE:   08/01/23 Bike L3 Warm-Up Seated  Leg press with 80# 2x10   Leg curls with 60# 2x10 Standing in // bars  Hip abduction 3# 2x10     07/25/23 Supine  Bilateral SAQs with 3# 2x10  Bilateral SLR 2x5  Bridges 2x10  Single leg bridges x10  07/18/23 PT initial Eval    PATIENT EDUCATION:  Education details: activity modification  Person educated: Patient Education method: Explanation Education  comprehension: verbalized understanding  HOME EXERCISE PROGRAM: Access Code: [email or web address] URL: [email or web address] Date: 07/25/2023 Prepared by: Deward Ming  Exercises - Small Range Straight Leg Raise  - 1-2 x daily - 2 sets - 10 reps - Supine Hip Flexion with Resistance Loop  - 1-2 x daily - 2 sets - 10 reps - Figure 4 Bridge  - 1-2 x daily - 2 sets - 10 reps    ASSESSMENT:  CLINICAL IMPRESSION: Today: PT progressed patient through lower extremity strengthening today. PT introduced the use of lower extremity resistance machines. Patient able to tolerate well with no increase in pain; req minimal verbal cues for proper therapeutic exercise progression.  Patient will benefit from PT to return to prior level of function    Eval: Patient is a 53 y.o. y.o. female who was seen today for physical therapy evaluation and treatment for knee pain, walking issues. Patient presents to PT with the following objective impairments: Abnormal gait, decreased endurance, decreased mobility, difficulty walking, decreased ROM, decreased strength, postural dysfunction, and pain. These impairments limit the patient in activities such as carrying, lifting, bending, standing, squatting, stairs, locomotion level, and caring for others. These impairments also limit the patient in participation such as meal prep, cleaning, laundry, shopping, and yard work. The patient will benefit from PT to address the limitations/impairments listed below to return to their prior level of function in the domains of activity and participation.    PERSONAL FACTORS: 1-2 comorbidities: fibromyalgia, RA   are also affecting patient's functional outcome.   REHAB POTENTIAL: Fair    CLINICAL DECISION MAKING: Stable/uncomplicated  EVALUATION COMPLEXITY: Moderate     GOALS: Goals reviewed with patient? No  SHORT TERM GOALS: Target date: 08/08/2023     1. Patient will be independent with a basic  stretching/strengthening HEP  Baseline:  Goal status: INITIAL   LONG TERM GOALS: Target date: 08/29/2023    Patient will score a >/= 40 on the LEFS    to demonstrate a Minimally Clinically Importance Difference (MCID) in ADL completion, home/community ambulation, and lifting/bending/squatting. Baseline:  Goal status: INITIAL    2.  Patient will be independent with a comprehensive strengthening HEP  Baseline:  Goal status: INITIAL  3. Patient will be able to demonstrate FULL right knee active range of motion to facilitate ADL completion, lifting, squatting. Baseline:  Goal status: INITIAL    PLAN:  PT FREQUENCY: 1x/week  PT DURATION: 6 weeks  PLANNED INTERVENTIONS: 97110-Therapeutic exercises, 97530- Therapeutic activity, 97112- Neuromuscular re-education, 97535- Self Care, [id number]- Manual therapy, [phone]- Gait training, Balance training, Stair training, Taping, Dry Needling, Joint mobilization, Joint manipulation, Spinal manipulation, Spinal mobilization, Cryotherapy, and Moist heat.  PLAN FOR NEXT SESSION: progress lower extremity strength for knee pain   Deward Ming, PT 08/01/2023, 2:37 PM

## 2023-08-04 ENCOUNTER — Telehealth: Payer: 59 | Admitting: Nurse Practitioner

## 2023-08-08 ENCOUNTER — Encounter (HOSPITAL_COMMUNITY): Payer: 59

## 2023-08-08 ENCOUNTER — Ambulatory Visit: Payer: 59 | Admitting: Nutrition

## 2023-08-10 DIAGNOSIS — E1165 Type 2 diabetes mellitus with hyperglycemia: Secondary | ICD-10-CM | POA: Diagnosis not present

## 2023-08-10 DIAGNOSIS — G4733 Obstructive sleep apnea (adult) (pediatric): Secondary | ICD-10-CM | POA: Diagnosis not present

## 2023-08-10 DIAGNOSIS — G8929 Other chronic pain: Secondary | ICD-10-CM | POA: Diagnosis not present

## 2023-08-10 DIAGNOSIS — M069 Rheumatoid arthritis, unspecified: Secondary | ICD-10-CM | POA: Diagnosis not present

## 2023-08-15 ENCOUNTER — Encounter (HOSPITAL_COMMUNITY): Payer: 59

## 2023-08-19 ENCOUNTER — Telehealth: Payer: Self-pay | Admitting: Internal Medicine

## 2023-08-19 DIAGNOSIS — J4541 Moderate persistent asthma with (acute) exacerbation: Secondary | ICD-10-CM

## 2023-08-19 DIAGNOSIS — J45901 Unspecified asthma with (acute) exacerbation: Secondary | ICD-10-CM

## 2023-08-19 MED ORDER — PREDNISONE 10 MG PO TABS: ORAL_TABLET | ORAL | 0 refills | Status: DC

## 2023-08-19 MED ORDER — AZITHROMYCIN 250 MG PO TABS: ORAL_TABLET | ORAL | 0 refills | Status: DC

## 2023-08-19 NOTE — Telephone Encounter (Signed)
RA/asthma  Allergies  Allergen Reactions   Peanut-Containing Drug Products Anaphylaxis    Swelling    Tegretol [Carbamazepine] Shortness Of Breath and Swelling   Beef Allergy     Does not eat red meat   Lithium Other (See Comments)    Bad acne    Pork-Derived Products Other (See Comments)    Does not eat pork products   Sweet Potato Itching   Penicillins Rash       Fatigu x 2 days Feeling hot and cold - x 1-2 day s; could be hot flashes Yellow gree sputum x 1 days Since last night chest rattling, dyspnea, x last night  Not sick enough for ER Per RN   A Acute asthma  Plan  - [facility] - [facility] said [facility] [place]

## 2023-08-19 NOTE — Telephone Encounter (Signed)
Pred 5 days Please take prednisone 40 mg x1 day, then 30 mg x1 day, then 20 mg x1 day, then 10 mg x1 day, and then 5 mg x1 day and stop  And   Z pak sent to [facility]

## 2023-08-22 ENCOUNTER — Encounter (HOSPITAL_COMMUNITY): Payer: 59

## 2023-08-23 DIAGNOSIS — Z299 Encounter for prophylactic measures, unspecified: Secondary | ICD-10-CM | POA: Diagnosis not present

## 2023-08-23 DIAGNOSIS — D849 Immunodeficiency, unspecified: Secondary | ICD-10-CM | POA: Diagnosis not present

## 2023-08-23 DIAGNOSIS — M069 Rheumatoid arthritis, unspecified: Secondary | ICD-10-CM | POA: Diagnosis not present

## 2023-08-23 DIAGNOSIS — M797 Fibromyalgia: Secondary | ICD-10-CM | POA: Diagnosis not present

## 2023-08-23 DIAGNOSIS — E1142 Type 2 diabetes mellitus with diabetic polyneuropathy: Secondary | ICD-10-CM | POA: Diagnosis not present

## 2023-08-29 ENCOUNTER — Encounter (HOSPITAL_COMMUNITY): Payer: 59

## 2023-08-30 DIAGNOSIS — Z23 Encounter for immunization: Secondary | ICD-10-CM | POA: Diagnosis not present

## 2023-08-30 DIAGNOSIS — E1169 Type 2 diabetes mellitus with other specified complication: Secondary | ICD-10-CM | POA: Diagnosis not present

## 2023-08-30 DIAGNOSIS — R5383 Other fatigue: Secondary | ICD-10-CM | POA: Diagnosis not present

## 2023-08-30 DIAGNOSIS — Z299 Encounter for prophylactic measures, unspecified: Secondary | ICD-10-CM | POA: Diagnosis not present

## 2023-08-31 ENCOUNTER — Encounter: Payer: 59 | Attending: Internal Medicine | Admitting: Nutrition

## 2023-08-31 ENCOUNTER — Ambulatory Visit: Payer: 59 | Admitting: Nutrition

## 2023-08-31 VITALS — Ht 68.5 in | Wt 223.4 lb

## 2023-08-31 DIAGNOSIS — G4733 Obstructive sleep apnea (adult) (pediatric): Secondary | ICD-10-CM | POA: Diagnosis not present

## 2023-08-31 DIAGNOSIS — I10 Essential (primary) hypertension: Secondary | ICD-10-CM | POA: Insufficient documentation

## 2023-08-31 DIAGNOSIS — E118 Type 2 diabetes mellitus with unspecified complications: Secondary | ICD-10-CM | POA: Diagnosis not present

## 2023-08-31 DIAGNOSIS — E66812 Obesity, class 2: Secondary | ICD-10-CM | POA: Diagnosis present

## 2023-08-31 NOTE — Patient Instructions (Signed)
Keep up the great job Eat three meals per day a day Don't skip meals. Increase protein plant based foods

## 2023-08-31 NOTE — Progress Notes (Signed)
Medical Nutrition Therapy  Appointment Start time:  1537  Appointment End time:  1610  Primary concerns today: Dm Type 2, Obesity,   Referral diagnosis: E11.8, E66.9 Preferred learning style: No preference. Learning readiness: Changes in progress    NUTRITION ASSESSMENT Follow up Type 2 DM with obesity. Sees Dr. Sherryll Burger in [place]. A1C 6.0%. Feels a lot better. Lost 9 lbs. Drinking only water and plain coffee, Eating more vegetables and fruit Avoided fried, Cut out sodas, tea sweets Watching more potion control. DOing more ginger, lemon and tumeric. Starting to work on gardening. No longer eating out. Buying frozen vegetable.  Has lost 9 lbs in 3 months. Doing very well with lifestyle changes. Goals set previously Keep up the [place] job!! Increase exercise to 150 mins a week.- not able to due to RA Drink 100 oz of water per day.-drinking 70 oz Take metformin daily. Anthropometrics     Wt Readings from Last 3 Encounters:  08/31/23 223 lb 6.4 oz (101.3 kg)  06/05/23 232 lb 3.2 oz (105.3 kg)  03/09/23 254 lb 6.4 oz (115.4 kg)   Ht Readings from Last 3 Encounters:  08/31/23 5' 8.5" (1.74 m)  06/05/23 5\' 8"  (1.727 m)  03/09/23 5' 7.5" (1.715 m)   Body mass index is 33.47 kg/m. @BMIFA @ Facility age limit for growth %iles is 20 years. Facility age limit for growth %iles is 20 years.  Clinical Medical Hx: Bipolar disease, high cholesterol, incontinence, anxiety, depression, OCD Medications: See Chart Labs: Last A1c 6 ,  Notable Signs/Symptoms: see cahrt  Lifestyle & Dietary Hx   Estimated daily fluid intake: 40-64 oz Supplements: Folic acid, Vit B 12, MVI, Vit E Sleep: 6 Stress / self-care: Financial  Current average weekly physical activity: Has beem walking some.  24-Hr Dietary Recall Eats 3 meals per day now. Drinking water. Working on cutting out diet sodas.   Estimated Energy Needs Calories: 1200 Carbohydrate: 135g Protein: 90g Fat: 33g   NUTRITION  DIAGNOSIS  NB-1.1 Food and nutrition-related knowledge deficit As related to Obesity.  As evidenced by BMI 37 .   NUTRITION INTERVENTION  Nutrition education (E-1) on the following topics:  Lifestyle Medicine  - Whole Food, Plant Predominant Nutrition is highly recommended: Eat Plenty of vegetables, Mushrooms, fruits, Legumes, Whole Grains, Nuts, seeds in lieu of processed meats, processed snacks/pastries red meat, poultry, eggs.    -It is better to avoid simple carbohydrates including: Cakes, Sweet Desserts, Ice Cream, Soda (diet and regular), Sweet Tea, Candies, Chips, Cookies, Store Bought Juices, Alcohol in Excess of  1-2 drinks a day, Lemonade,  Artificial Sweeteners, Doughnuts, Coffee Creamers, "Sugar-free" Products, etc, etc.  This is not a complete list.....  Exercise: If you are able: 30 -60 minutes a day ,4 days a week, or 150 minutes a week.  The longer the better.  Combine stretch, strength, and aerobic activities.  If you were told in the past that you have high risk for cardiovascular diseases, you may seek evaluation by your heart doctor prior to initiating moderate to intense exercise programs.   Handouts Provided Include  Lifestyle Medicine Meal Plan Card  Learning Style & Readiness for Change Teaching method utilized: Visual & Auditory  Demonstrated degree of understanding via: Teach Back  Barriers to learning/adherence to lifestyle change: none  Goals Established by Pt Goals Keep up the great job Eat three meals per day a day Don't skip meals. Increase protein plant based foods    MONITORING & EVALUATION Dietary intake, weekly physical  activity, and weight in 6 months.  Next Steps  Patient is to work on meal planning and exercise.Marland Kitchen

## 2023-09-01 ENCOUNTER — Telehealth: Payer: Self-pay

## 2023-09-01 ENCOUNTER — Encounter: Payer: Self-pay | Admitting: Nurse Practitioner

## 2023-09-01 ENCOUNTER — Telehealth: Payer: 59 | Admitting: Nurse Practitioner

## 2023-09-01 DIAGNOSIS — G4700 Insomnia, unspecified: Secondary | ICD-10-CM | POA: Insufficient documentation

## 2023-09-01 DIAGNOSIS — G4733 Obstructive sleep apnea (adult) (pediatric): Secondary | ICD-10-CM | POA: Diagnosis not present

## 2023-09-01 DIAGNOSIS — R0609 Other forms of dyspnea: Secondary | ICD-10-CM | POA: Insufficient documentation

## 2023-09-01 DIAGNOSIS — J4550 Severe persistent asthma, uncomplicated: Secondary | ICD-10-CM

## 2023-09-01 DIAGNOSIS — M069 Rheumatoid arthritis, unspecified: Secondary | ICD-10-CM | POA: Diagnosis not present

## 2023-09-01 DIAGNOSIS — F5101 Primary insomnia: Secondary | ICD-10-CM | POA: Diagnosis not present

## 2023-09-01 MED ORDER — ESZOPICLONE 1 MG PO TABS: 1.0000 mg | ORAL_TABLET | Freq: Every evening | ORAL | 1 refills | Status: DC | PRN

## 2023-09-01 NOTE — Patient Instructions (Signed)
Continue Breztri 2 puffs Twice daily. Brush tongue and rinse mouth afterwards Continue Albuterol inhaler 2 puffs or 3 mL every 6 hours as needed for shortness of breath or wheezing. Notify if symptoms persist despite rescue inhaler/neb use. Continue levocetirizine 5 mg daily for allergies - new prescription sent to pharmacy  Continue Saline nasal rinses (Netti Pot or [employer]) 1-2 times a day. Use the flonase about 20-30 min after Continue Flonase nasal spray 1-2 sprays each nostril daily for nasal congestion/drainage  Continue singulair 1 tab At bedtime for allergies/asthma Continue Astelin nasal spray 2 sprays each nostril Twice daily for sinus drainage, allergies  Asthma action plan: START nebs up to four times a day for worsening shortness of breath, wheezing and cough. If you symptoms do not improve in 24-48 hours, contact us for steroid course. If your symptoms rapidly worsen, you have trouble talking or extreme difficulty breathing, or you're not getting relief from your nebulizer/rescue, go to the emergency department.  Given your progression of your rheumatoid and trouble with your breathing, I am recommending we repeat your lung function testing and obtain high resolution CT chest to ensure you have not developed progressive interstitial lung disease related to the RA.  Continue prednisone and methotrexate prescribed by rheumatology in interim   Continue to use CPAP every night, minimum of 4-6 hours a night.  Change equipment as directed. Wash your tubing with warm soap and water daily, hang to dry. Wash humidifier portion weekly. Use bottled, distilled water and change daily Be aware of reduced alertness and do not drive or operate heavy machinery if experiencing this or drowsiness.  Exercise encouraged, as tolerated. Healthy weight management discussed.  Avoid or decrease alcohol consumption and medications that make you more sleepy, if possible. Notify if persistent daytime  sleepiness occurs even with consistent use of PAP therapy.  Start lunesta 1 mg At bedtime as needed for sleep. Take immediately before bed. Ensure you have 7-8 hours in the bed after taking. Monitor for any mood changes or changes in sleep habits. Stop and notify immediately if these occur. Do not drive or operate heavy machinery after taking. Do not take with alcohol or other sedating medications. Ensure you apply your CPAP within 5-10 minutes of taking to avoid falling asleep without it. May cause some morning grogginess or vivid dreams. If you develop sleep walking or changes in sleep habits, stop immediately and notify. Ensure safe sleep environment. Do not take with other sedating medications.   Follow up in 6-8 weeks after PFT ad HRCT with Dr. [name] or Philis Nettle. If symptoms do not improve or worsen, please contact office for sooner follow up or seek emergency care.

## 2023-09-01 NOTE — Assessment & Plan Note (Signed)
Unclear if her DOE is related to her asthma or possible progressive RA ILD. Suspect it is multifactorial related to asthma, deconditioning, pain related to RA, chronic fatigue. She does not appear to be having acute symptoms with asthma exacerbation. Breathing unlabored during visit. She was able to speak in complete sentences without difficulties. Continue aggressive maintenance regimen. Difficult to assess for eosinophilia to determine appropriateness of biologic therapy due to chronic steroid use. Will review alternative therapy options after PFT and HRCT at follow up. Action plan in place. Avoid triggers.

## 2023-09-01 NOTE — Progress Notes (Signed)
Patient ID: [name], female     DOB: September 16, 1970, 53 y.o.      MRN: [id number]  No chief complaint on file.   Virtual Visit via Video Note  I connected with [name] on 09/01/23 at  2:30 PM EST by a video enabled telemedicine application and verified that I am speaking with the correct person using two identifiers.  Location: Patient: Home Provider: Office   I discussed the limitations of evaluation and management by telemedicine and the availability of in person appointments. The patient expressed understanding and agreed to proceed.  History of Present Illness: 53 year old patient, never smoker followed for asthma and OSA. [name] is a patient of [name] and last seen 06/07/2023 by [name] NP. Past medical history significant for GERD, RA on methotrexate, bipolar, depression.   TESTS/EVENTS: 07/21/2022 HST: AHI 15/h, SpO2 low 82% 10/06/2022 PFT: FVC 65, FEV1 68, ratio 88, TLC 74, DLCOcor 72 12/28/2022 CTA chest: No PE.  Moderate sized hiatal hernia, stable.  Borderline sized bilateral hilar and mediastinal lymph nodes.  1 cm low-density lesion in the right thyroid lobe, smaller than prior study.  Parabronchial thickening.  Nodular groundglass opacities throughout the right lung, most pronounced in right lower lobe and posterior right upper lobe.  Few scattered groundglass nodularities in the left lung. Likely infectious/inflammatory 03/29/2023 HST: AHI 10.6/h, SpO2 low 70%   06/28/2022: OV with Dr. [name] for hospital follow up. [name] was admitted 11/6-11/8 for asthma exacerbation due to RSV infection. [name] had been having DOE since    10/10/2022: Ov with [name] NP for follow up after undergoing PFTs and HST. [name] was found to have a restrictive defect and minimal diffusion defect on [name] pulmonary function testing. [name] home sleep study revealed mildly moderate OSA. [name] has never been treated for this with CPAP before.  Today, [name] tells me that [name] was doing much better after  [name] started the [facility] inhaler. Over the past few days (starting approx 3/21), [name] developed sinus congestion, sneezing, watery eyes. [name] thought it was allergies. [name] now has a cough and is feeling like [name] chest is a little tighter, especially when [name] lays back. [name] also notices more wheezing at night. [name] feels like [name] gets more short winded with activities around the house but doesn't feel like [name] shortness of breath is severe. [name] denies any fevers, chills, hemoptysis, leg swelling. [name] is using [name] Breztri twice a day. Increased use of albuterol over the past few days. [name] is not currently on anything for allergies.  Regarding [name] sleep, [name] feels very tired during the day. [name] snores at night. [name] denies any drowsy driving or morning headaches. [name] would like to know what treatment options there are.    02/01/2023: OV with [name] NP for follow up.  [name] was recently hospitalized from 12/28/2022-12/30/2022 for pneumonia treated with cefdinir and azithromycin.  Prior to this [name] had been seen in the emergency department 12/25/2022 for myalgias/arthralgias.  [name] had, had tick bites prior to this and had been on doxycycline.  [name] was provided with Norco and discharged home.  [name] also had viral testing that was negative. [name] tells me today that [name] is finally starting to feel better.  [name] is still having some more shortness of breath compared to [name] baseline and [name] does not feel like [name] stamina is quite back to where it was before.  [name] does still have an occasional cough but it is primarily dry.  Feels  like it is improving.  [name] has not noticed quite as much wheezing.  Denies any fevers, chills, hemoptysis.  Myalgias and arthralgias are better as well.  [name] is still using [name] nebs twice a day.  [name] is been able to back off from 4 times a day.  Still on Breztri twice a day.  Is having difficulties with [name] taste and smell.  Does not feel like [name] can taste much of anything and [name] is not able to smell [name]  candles like [name] usually is.  [name] did test negative for COVID so [name] is not sure why this is.  Does have some sinus symptoms which do not feel much worse than [name] baseline.  [name] was using Flonase a few times a day but then it made [name] nosebleed. [name] has been having trouble with [name] CPAP due to not feeling like [name] is getting enough air.  [name] says that [name] thought this was related to recovering from pneumonia but then [name] also thinks that it might be a mass problem.  [name] is used a couple different masks.  Currently settled on nasal pillow which [name] still does not always feel it gives [name] enough air at times.  [name] tends to have to open [name] mouth to breathe more easily.  [name] denies any drowsy driving or morning headaches. 01/01/2023-01/30/2023: CPAP auto 5-15 cmH2O 13/30 days; 17% > 4 hours; average use 3 hours 28 minutes Pressure 95th 10.1 Leaks 95th 8.2 AHI 1.9   03/09/2023: OV with [name] NP for follow-up.  Has been doing better since [name] was here last.  [name] does still have some occasional shortness of breath and sometimes feels like [name] breath catches.  Notices it more so when [name] is outside and it is hot.  Cough is mostly gone.  Not noticing any wheezing or chest congestion.  [name] did have an RA flare and is currently on prednisone for this.  Denies any fevers, chills, hemoptysis, lower extremity swelling, orthopnea.  [name] is on Breztri twice a day.  Taste and smell is getting better.  [name] has been using Flonase, which helped [name] sinus symptoms but [name] does occasionally still have some postnasal drainage.  [name] feels like this is primarily [name] allergies that bother [name].  [name] is taking a daily allergy pill which does help. [name] feels like [name] has been doing better with [name] CPAP.  Wearing it most nights.  Occasionally will wake up with it off.  [name] has been getting calls from [employer] about not wearing [name] CPAP enough.  [name] is confused about this because [name] has been wearing it more consistently.  [name] was in the  hospital before so [name] is not sure if this is the compliance problem they are talking about.  Denies any drowsy driving or morning headaches.  [name] does receive benefit from using [name] CPAP. 02/07/2023-03/08/2023: CPAP 8-20 cmH2O 29/30 days; 83% >4 hr; average use 6 hr 28 min Pressure 95th 15 Leaks 12.8 AHI 2.3   06/07/2023:OV with Aikeem Lilley,NP for follow-up.  After last visit, we had to repeat [name] home sleep study due to previous compliance issues.  The medical supply company stated that [name] would have to start over with the process as [name] had been deemed noncompliant prior to July, even though [name] had started to use [name] CPAP consistently and was receiving benefit from use.  [name] does miss using the CPAP.  [name] felt like [name] slept better at night with it.  [name] is only  able to do 3 to 4 hours at a time now.  Has daytime fatigue symptoms.  Wants to restart therapy.  Denies any issues with drowsy driving or morning headaches.  [name] only concern is that [name] is having some issues with a thyroid nodule which does cause some difficulties with swallowing.  [name] has an upcoming biopsy.  [name] is a little worried that [name] may not be able to tolerate using the CPAP with this nodule but is willing to retry.  Will plan to set up [name] appointment after [name] biopsy so hopefully will have more answers at that point in time. [name] breathing is doing okay for the most part.  [name] is having some increased sinus symptoms with the current weather.  Using [name] Xyzal, Flonase and doing saline rinses.  [name] still taking Singulair at bedtime.  Not using [name] rescue much.  Still on Breztri twice a day, which does help [name].  [name] does also occasionally wake up in the morning feeling a little nauseated and like [name] has stuff in the back of [name] throat.  [name] is not sure if this postnasal drainage or reflux.  [name] does take pantoprazole daily.  No other overt signs of reflux.  No fevers, chills, hoarse voice, ear pain, sore throat. No sick  exposures.  09/01/2023: Today - follow up Patient presents today for follow-up via video visit.  At [name] last visit, we treated [name] for URI symptoms.  [name] did feel like [name] got some better but has continued to have issues with [name] breathing.  [name] also had a lot of trouble with [name] RA and is currently on prednisone.  [name] did have increased cough a few weeks ago was treated with Z-Pak.  Does feel like the symptoms have improved.  Cough is mostly resolved.  [name] still gets winded with most activities.  This has been ongoing for months now.  No significant wheezing, chest congestion.  No fevers, chills, orthopnea, lower extremity swelling, hemoptysis.  [name] is on an aggressive maintenance regimen with Breztri, Singulair, neb treatments.  GERD is well-controlled with PPI.  [name] also feels like [name] sinus symptoms are stable right now. Does feel like pressures could be a little lower.   Regarding [name] CPAP, [name] did finally get a new one.  Started back on therapy towards the end of January.  [name] is using it most nights.  Does occasionally have trouble when [name] is feeling more short winded or having an RA flareup and has to take it off.  [name] also tends to have trouble with [name] sleep at night.  Sometimes struggles to fall and stay asleep.  [name] did go to bed until around 7 AM this morning.  [name] has had difficulties with [name] sleep for many years now.  [name] was on Ambien in the past but had developed sleepwalking with this and it also gave [name] severe morning drowsiness.  [name] does not recall [name] exact dosing on this.  Has never had a history of sleep parasomnias prior to this.  [name] has tried other sleep medicines, which did not have any significant side effects but did not seem to work very well for [name].  This was before [name] was on CPAP therapy.  [name] has never tried [place].  [name] denies any issues with drowsy driving, morning headaches. No SI/HI.  Sleep deprivation does make [name] feel more anxious.  08/10/2023-09/01/2023 CPAP 8-18  cmH2O 22/23 days; 78% >4 hr; average use 6 hr 7 min Pressure 95th  10.6 Leaks 95th 11 AHI 1.6  Allergies  Allergen Reactions   Peanut-Containing Drug Products Anaphylaxis    Swelling    Tegretol [Carbamazepine] Shortness Of Breath and Swelling   Beef Allergy     Does not eat red meat   Lithium Other (See Comments)    Bad acne    Pork-Derived Products Other (See Comments)    Does not eat pork products   Sweet Potato Itching   Penicillins Rash   There is no immunization history for the selected administration types on file for this patient. Past Medical History:  Diagnosis Date   Anemia    Anxiety    Arthritis    orthopedic   Bipolar disorder (HCC)    Depression    Fibromyalgia    GERD (gastroesophageal reflux disease)    Hypotension    IBS (irritable bowel syndrome)    Migraine    PTSD (post-traumatic stress disorder)    on prazosin   Sleep apnea    Tonsillar hypertrophy    Uterine fibroid     Tobacco History: Social History   Tobacco Use  Smoking Status Never  Smokeless Tobacco Never   Counseling given: Not Answered   Outpatient Medications Prior to Visit  Medication Sig Dispense Refill   acetaminophen (TYLENOL) 325 MG tablet Take 2 tablets (650 mg total) by mouth every 6 (six) hours as needed for mild pain (or Fever >/= 101). 100 tablet 0   albuterol (PROVENTIL) (2.5 MG/3ML) 0.083% nebulizer solution Take 3 mLs (2.5 mg total) by nebulization every 4 (four) hours as needed for wheezing or shortness of breath. 75 mL 2   albuterol (VENTOLIN HFA) 108 (90 Base) MCG/ACT inhaler Inhale 2 puffs into the lungs every 4 (four) hours as needed for wheezing or shortness of breath. 18 g 2   allopurinol (ZYLOPRIM) 300 MG tablet Take 300 mg by mouth daily.     ALPRAZolam (XANAX) 0.5 MG tablet Take 0.5 mg by mouth 2 (two) times daily as needed for anxiety.     atorvastatin (LIPITOR) 40 MG tablet Take 40 mg by mouth daily.     azelastine (ASTELIN) 0.1 % nasal spray Place 2  sprays into both nostrils 2 (two) times daily. Use in each nostril as directed 30 mL 5   azithromycin (ZITHROMAX) 250 MG tablet Azithromycin 500mg  x 1 daily and then next 4 days is 250mg  daily x 4 days 6 tablet 0   Budeson-Glycopyrrol-Formoterol (BREZTRI AEROSPHERE) 160-9-4.8 MCG/ACT AERO Inhale 2 puffs into the lungs in the morning and at bedtime. 10.7 g 3   cholecalciferol (VITAMIN D3) 25 MCG (1000 UNIT) tablet Take 4,000 Units by mouth daily.     colchicine 0.6 MG tablet Take 0.6 mg by mouth daily.     diclofenac Sodium (VOLTAREN) 1 % GEL Apply 1 Application topically 4 (four) times daily.     DULoxetine (CYMBALTA) 60 MG capsule Take 60 mg by mouth daily.     famotidine (PEPCID) 20 MG tablet Take 1 tablet (20 mg total) by mouth at bedtime. 30 tablet 5   folic acid (FOLVITE) 1 MG tablet Take 1 mg by mouth daily.     furosemide (LASIX) 20 MG tablet Take 1 tablet (20 mg total) by mouth daily. 30 tablet 1   HYDROcodone-acetaminophen (NORCO) 5-325 MG tablet Take 1 tablet by mouth every 6 (six) hours as needed for moderate pain. 6 tablet 0   hydrOXYzine (ATARAX) 50 MG tablet Take 0.5 tablets (25 mg total) by mouth 2 (  two) times daily as needed for itching or anxiety. 30 tablet 3   ipratropium-albuterol (DUONEB) 0.5-2.5 (3) MG/3ML SOLN Take 3 mLs by nebulization every 6 (six) hours as needed. 360 mL 3   LATUDA 40 MG TABS tablet Take 40 mg by mouth at bedtime.     levocetirizine (XYZAL) 5 MG tablet take 1 tablet (5 MILLIGRAM total) by mouth daily. 30 tablet 11   metFORMIN (GLUCOPHAGE) 500 MG tablet Take 1 tablet (500 mg total) by mouth daily with breakfast. 90 tablet 3   methotrexate 2.5 MG tablet Take 25 mg by mouth once a week. Caution:Chemotherapy. Protect from light.  Takes 10 per day once a week.     mirabegron ER (MYRBETRIQ) 25 MG TB24 tablet Take 25 mg by mouth daily.     montelukast (SINGULAIR) 10 MG tablet Take 1 tablet (10 mg total) by mouth at bedtime. 30 tablet 11   MOUNJARO 5 MG/0.5ML  Pen Inject 5 mg into the skin once a week.     Oxcarbazepine (TRILEPTAL) 300 MG tablet Take 1 tablet (300 mg total) by mouth 2 (two) times daily. (Patient taking differently: Take 600 mg by mouth at bedtime.) 60 tablet 2   pantoprazole (PROTONIX) 40 MG tablet Take 1 tablet (40 mg total) by mouth daily. 30 tablet 1   prazosin (MINIPRESS) 5 MG capsule Take 5 mg by mouth 3 (three) times daily. @ Bedtime     predniSONE (DELTASONE) 10 MG tablet Please take prednisone 40 mg x1 day, then 30 mg x1 day, then 20 mg x1 day, then 10 mg x1 day, and then 5 mg x1 day and stop 11 tablet 0   pregabalin (LYRICA) 75 MG capsule Take 1 capsule by mouth daily.     promethazine (PHENERGAN) 25 MG tablet Take 25 mg by mouth every 6 (six) hours as needed.     vitamin B-12 (CYANOCOBALAMIN) 100 MCG tablet Take 100 mcg by mouth daily.     No facility-administered medications prior to visit.     Review of Systems:   Constitutional: No weight loss or gain, night sweats, fevers, chills, or lassitude. +fatigue  HEENT: No headaches, tooth/dental problems, or sore throat. No sneezing, itching, ear ache, nasal congestion, post nasal drip, difficulty swallowing CV:  No chest pain, orthopnea, PND, swelling in lower extremities, anasarca, dizziness, palpitations, syncope Resp: +snoring (resolves with CPAP), shortness of breath with exertion. No excess mucus or change in color of mucus. No productive or non-productive. No hemoptysis. No wheezing.  No chest wall deformity GI:  No heartburn, indigestion, abdominal pain, nausea, vomiting, diarrhea, change in bowel habits, loss of appetite, bloody stools.  GU: No dysuria, change in color of urine, urgency or frequency.  No flank pain, no hematuria  Skin: No rash, lesions, ulcerations MSK:  No joint pain or swelling.   Neuro: No dizziness or lightheadedness.  Psych: No depression or anxiety. Mood stable. +sleep disturbance   Observations/Objective: Patient is well-developed,  well-nourished in no acute distress. A&Ox4. Resting comfortably at home. Unlabored breathing. Speech is clear and coherent with logical content.   Assessment and Plan: Mild obstructive sleep apnea Mild OSA on CPAP. [name] has not quite been on therapy for 30 days. [name] has good compliance and receives benefit from use. Will adjust [name] pressures down to 7-10 cmH2O and reassess response at follow up. Days with decreased usage seems to be primarily related to insomnia and/or RA symptoms. Encouraged [name] to continue utilizing nightly. Aware of proper care/use. Safe driving practices  reviewed.   Patient Instructions  Continue Breztri 2 puffs Twice daily. Brush tongue and rinse mouth afterwards Continue Albuterol inhaler 2 puffs or 3 mL every 6 hours as needed for shortness of breath or wheezing. Notify if symptoms persist despite rescue inhaler/neb use. Continue levocetirizine 5 mg daily for allergies - new prescription sent to pharmacy  Continue Saline nasal rinses (Netti Pot or [employer]) 1-2 times a day. Use the flonase about 20-30 min after Continue Flonase nasal spray 1-2 sprays each nostril daily for nasal congestion/drainage  Continue singulair 1 tab At bedtime for allergies/asthma Continue Astelin nasal spray 2 sprays each nostril Twice daily for sinus drainage, allergies  Asthma action plan: START nebs up to four times a day for worsening shortness of breath, wheezing and cough. If you symptoms do not improve in 24-48 hours, contact us for steroid course. If your symptoms rapidly worsen, you have trouble talking or extreme difficulty breathing, or you're not getting relief from your nebulizer/rescue, go to the emergency department.  Given your progression of your rheumatoid and trouble with your breathing, I am recommending we repeat your lung function testing and obtain high resolution CT chest to ensure you have not developed progressive interstitial lung disease related to the RA.  Continue  prednisone and methotrexate prescribed by rheumatology in interim   Continue to use CPAP every night, minimum of 4-6 hours a night.  Change equipment as directed. Wash your tubing with warm soap and water daily, hang to dry. Wash humidifier portion weekly. Use bottled, distilled water and change daily Be aware of reduced alertness and do not drive or operate heavy machinery if experiencing this or drowsiness.  Exercise encouraged, as tolerated. Healthy weight management discussed.  Avoid or decrease alcohol consumption and medications that make you more sleepy, if possible. Notify if persistent daytime sleepiness occurs even with consistent use of PAP therapy.  Start lunesta 1 mg At bedtime as needed for sleep. Take immediately before bed. Ensure you have 7-8 hours in the bed after taking. Monitor for any mood changes or changes in sleep habits. Stop and notify immediately if these occur. Do not drive or operate heavy machinery after taking. Do not take with alcohol or other sedating medications. Ensure you apply your CPAP within 5-10 minutes of taking to avoid falling asleep without it. May cause some morning grogginess or vivid dreams. If you develop sleep walking or changes in sleep habits, stop immediately and notify. Ensure safe sleep environment. Do not take with other sedating medications.   Follow up in 6-8 weeks after PFT ad HRCT with Dr. [name] or Philis Nettle. If symptoms do not improve or worsen, please contact office for sooner follow up or seek emergency care.   Severe persistent asthma Unclear if [name] DOE is related to [name] asthma or possible progressive RA ILD. Suspect it is multifactorial related to asthma, deconditioning, pain related to RA, chronic fatigue. [name] does not appear to be having acute symptoms with asthma exacerbation. Breathing unlabored during visit. [name] was able to speak in complete sentences without difficulties. Continue aggressive maintenance regimen. Difficult to  assess for eosinophilia to determine appropriateness of biologic therapy due to chronic steroid use. Will review alternative therapy options after PFT and HRCT at follow up. Action plan in place. Avoid triggers.   Rheumatoid arthritis (HCC) See above. Follow up with rheumatology as scheduled   Insomnia Significant insomniac symptoms. Discussed risks of chronic sleep deprivation and potential treatment options. [name] had abnormal sleep habits  with zolpidem but no recurrence since discontinuing therapy. We will try [name] on low dose lunesta with very strict monitoring/follow up. Side effect profile reviewed. [name] understands to stop immediately and notify if abnormal sleep habits develop. [name] does have [name] sister living with [name], who will help monitor for any changes in sleep or mood. Aware to not drive/operate heavy machinery after taking.   DOE (dyspnea on exertion) See above     I discussed the assessment and treatment plan with the patient. The patient was provided an opportunity to ask questions and all were answered. The patient agreed with the plan and demonstrated an understanding of the instructions.   The patient was advised to call back or seek an in-person evaluation if the symptoms worsen or if the condition fails to improve as anticipated.  I provided 41 minutes of non-face-to-face time during this encounter.   Noemi Chapel, NP

## 2023-09-01 NOTE — Assessment & Plan Note (Signed)
Significant insomniac symptoms. Discussed risks of chronic sleep deprivation and potential treatment options. She had abnormal sleep habits with zolpidem but no recurrence since discontinuing therapy. We will try her on low dose lunesta with very strict monitoring/follow up. Side effect profile reviewed. She understands to stop immediately and notify if abnormal sleep habits develop. She does have her sister living with her, who will help monitor for any changes in sleep or mood. Aware to not drive/operate heavy machinery after taking.

## 2023-09-01 NOTE — Assessment & Plan Note (Signed)
See above. Follow up with rheumatology as scheduled.

## 2023-09-01 NOTE — Assessment & Plan Note (Signed)
See above

## 2023-09-01 NOTE — Assessment & Plan Note (Signed)
Mild OSA on CPAP. She has not quite been on therapy for 30 days. She has good compliance and receives benefit from use. Will adjust her pressures down to 7-10 cmH2O and reassess response at follow up. Days with decreased usage seems to be primarily related to insomnia and/or RA symptoms. Encouraged her to continue utilizing nightly. Aware of proper care/use. Safe driving practices reviewed.   Patient Instructions  Continue Breztri 2 puffs Twice daily. Brush tongue and rinse mouth afterwards Continue Albuterol inhaler 2 puffs or 3 mL every 6 hours as needed for shortness of breath or wheezing. Notify if symptoms persist despite rescue inhaler/neb use. Continue levocetirizine 5 mg daily for allergies - new prescription sent to pharmacy  Continue Saline nasal rinses (Netti Pot or [employer]) 1-2 times a day. Use the flonase about 20-30 min after Continue Flonase nasal spray 1-2 sprays each nostril daily for nasal congestion/drainage  Continue singulair 1 tab At bedtime for allergies/asthma Continue Astelin nasal spray 2 sprays each nostril Twice daily for sinus drainage, allergies  Asthma action plan: START nebs up to four times a day for worsening shortness of breath, wheezing and cough. If you symptoms do not improve in 24-48 hours, contact us for steroid course. If your symptoms rapidly worsen, you have trouble talking or extreme difficulty breathing, or you're not getting relief from your nebulizer/rescue, go to the emergency department.  Given your progression of your rheumatoid and trouble with your breathing, I am recommending we repeat your lung function testing and obtain high resolution CT chest to ensure you have not developed progressive interstitial lung disease related to the RA.  Continue prednisone and methotrexate prescribed by rheumatology in interim   Continue to use CPAP every night, minimum of 4-6 hours a night.  Change equipment as directed. Wash your tubing with warm soap and  water daily, hang to dry. Wash humidifier portion weekly. Use bottled, distilled water and change daily Be aware of reduced alertness and do not drive or operate heavy machinery if experiencing this or drowsiness.  Exercise encouraged, as tolerated. Healthy weight management discussed.  Avoid or decrease alcohol consumption and medications that make you more sleepy, if possible. Notify if persistent daytime sleepiness occurs even with consistent use of PAP therapy.  Start lunesta 1 mg At bedtime as needed for sleep. Take immediately before bed. Ensure you have 7-8 hours in the bed after taking. Monitor for any mood changes or changes in sleep habits. Stop and notify immediately if these occur. Do not drive or operate heavy machinery after taking. Do not take with alcohol or other sedating medications. Ensure you apply your CPAP within 5-10 minutes of taking to avoid falling asleep without it. May cause some morning grogginess or vivid dreams. If you develop sleep walking or changes in sleep habits, stop immediately and notify. Ensure safe sleep environment. Do not take with other sedating medications.   Follow up in 6-8 weeks after PFT ad HRCT with Dr. [name] or Philis Nettle. If symptoms do not improve or worsen, please contact office for sooner follow up or seek emergency care.

## 2023-09-04 ENCOUNTER — Encounter: Payer: Self-pay | Admitting: Nutrition

## 2023-09-06 DIAGNOSIS — M542 Cervicalgia: Secondary | ICD-10-CM | POA: Diagnosis not present

## 2023-09-06 DIAGNOSIS — M62830 Muscle spasm of back: Secondary | ICD-10-CM | POA: Diagnosis not present

## 2023-09-06 DIAGNOSIS — M546 Pain in thoracic spine: Secondary | ICD-10-CM | POA: Diagnosis not present

## 2023-09-06 DIAGNOSIS — M9903 Segmental and somatic dysfunction of lumbar region: Secondary | ICD-10-CM | POA: Diagnosis not present

## 2023-09-06 DIAGNOSIS — M9902 Segmental and somatic dysfunction of thoracic region: Secondary | ICD-10-CM | POA: Diagnosis not present

## 2023-09-06 DIAGNOSIS — M9901 Segmental and somatic dysfunction of cervical region: Secondary | ICD-10-CM | POA: Diagnosis not present

## 2023-09-14 ENCOUNTER — Other Ambulatory Visit: Payer: 59

## 2023-09-16 DIAGNOSIS — G4733 Obstructive sleep apnea (adult) (pediatric): Secondary | ICD-10-CM | POA: Diagnosis not present

## 2023-09-25 NOTE — Telephone Encounter (Signed)
 left message to see if she could move up her appt

## 2023-10-02 DIAGNOSIS — R21 Rash and other nonspecific skin eruption: Secondary | ICD-10-CM | POA: Diagnosis not present

## 2023-10-02 DIAGNOSIS — S80922D Unspecified superficial injury of left lower leg, subsequent encounter: Secondary | ICD-10-CM | POA: Diagnosis not present

## 2023-10-02 DIAGNOSIS — D849 Immunodeficiency, unspecified: Secondary | ICD-10-CM | POA: Diagnosis not present

## 2023-10-02 DIAGNOSIS — W57XXXA Bitten or stung by nonvenomous insect and other nonvenomous arthropods, initial encounter: Secondary | ICD-10-CM | POA: Diagnosis not present

## 2023-10-02 DIAGNOSIS — E7800 Pure hypercholesterolemia, unspecified: Secondary | ICD-10-CM | POA: Diagnosis not present

## 2023-10-02 DIAGNOSIS — R52 Pain, unspecified: Secondary | ICD-10-CM | POA: Diagnosis not present

## 2023-10-02 DIAGNOSIS — Z299 Encounter for prophylactic measures, unspecified: Secondary | ICD-10-CM | POA: Diagnosis not present

## 2023-10-02 DIAGNOSIS — E11649 Type 2 diabetes mellitus with hypoglycemia without coma: Secondary | ICD-10-CM | POA: Diagnosis not present

## 2023-10-17 DIAGNOSIS — R220 Localized swelling, mass and lump, head: Secondary | ICD-10-CM | POA: Diagnosis not present

## 2023-10-17 DIAGNOSIS — R52 Pain, unspecified: Secondary | ICD-10-CM | POA: Diagnosis not present

## 2023-10-17 DIAGNOSIS — M069 Rheumatoid arthritis, unspecified: Secondary | ICD-10-CM | POA: Diagnosis not present

## 2023-10-17 DIAGNOSIS — R519 Headache, unspecified: Secondary | ICD-10-CM | POA: Diagnosis not present

## 2023-10-17 DIAGNOSIS — Z299 Encounter for prophylactic measures, unspecified: Secondary | ICD-10-CM | POA: Diagnosis not present

## 2023-10-25 ENCOUNTER — Telehealth: Payer: Self-pay | Admitting: Nurse Practitioner

## 2023-10-25 NOTE — Telephone Encounter (Signed)
 Cmn received from [employer] for CPAP and BiPAP supplies.

## 2023-10-30 NOTE — Telephone Encounter (Signed)
 Rc'd fax confirmation of signed Dr.'s order. Sending now to scan.

## 2023-11-08 DIAGNOSIS — G4733 Obstructive sleep apnea (adult) (pediatric): Secondary | ICD-10-CM | POA: Diagnosis not present

## 2023-11-23 DIAGNOSIS — M797 Fibromyalgia: Secondary | ICD-10-CM | POA: Diagnosis not present

## 2023-11-23 DIAGNOSIS — M069 Rheumatoid arthritis, unspecified: Secondary | ICD-10-CM | POA: Diagnosis not present

## 2023-11-23 DIAGNOSIS — M722 Plantar fascial fibromatosis: Secondary | ICD-10-CM | POA: Diagnosis not present

## 2023-11-23 DIAGNOSIS — Z299 Encounter for prophylactic measures, unspecified: Secondary | ICD-10-CM | POA: Diagnosis not present

## 2023-11-23 DIAGNOSIS — D849 Immunodeficiency, unspecified: Secondary | ICD-10-CM | POA: Diagnosis not present

## 2023-11-24 ENCOUNTER — Telehealth: Payer: Self-pay | Admitting: Internal Medicine

## 2023-11-24 NOTE — Telephone Encounter (Unsigned)
 Copied from CRM [id number]. Topic: Clinical - Medication Refill >> Nov 24, 2023  2:38 PM Alverda Joe S wrote: Medication: Budeson-Glycopyrrol-Formoterol  (BREZTRI  AEROSPHERE) 160-9-4.8 MCG/ACT AERO  Has the patient contacted their pharmacy? Yes (Agent: If no, request that the patient contact the pharmacy for the refill. If patient does not wish to contact the pharmacy document the reason why and proceed with request.) (Agent: If yes, when and what did the pharmacy advise?)  This is the patient's preferred pharmacy:  [facility] - [place], [place] - [address] [place] [address] Phone: [phone] Fax: [phone]  [facility] [address], [place] - [address] [place] [address] Phone: [phone] Fax: [phone]  Is this the correct pharmacy for this prescription? Yes  [facility] - [place], [place] - [address] [phone] [address] [place]  [address]   If no, delete pharmacy and type the correct one.   Has the prescription been filled recently? No  Is the patient out of the medication? No  Has the patient been seen for an appointment in the last year OR does the patient have an upcoming appointment? Yes  Can we respond through MyChart? Yes  Agent: Please be advised that Rx refills may take up to 3 business days. We ask that you follow-up with your pharmacy.

## 2023-11-24 NOTE — Telephone Encounter (Unsigned)
 Copied from CRM [id number]. Topic: Clinical - Medication Refill >> Nov 24, 2023  2:32 PM Tianna S wrote: Medication: albuterol  (PROVENTIL ) (2.5 MG/3ML) 0.083% nebulizer solution  Has the patient contacted their pharmacy? Yes (Agent: If no, request that the patient contact the pharmacy for the refill. If patient does not wish to contact the pharmacy document the reason why and proceed with request.) (Agent: If yes, when and what did the pharmacy advise?)  This is the patient's preferred pharmacy:  [facility] - [place], [place] - [address] [place] [address] Phone: [phone] Fax: [phone]  [facility] [address], [place] - [address] [place] [address] Phone: [phone] Fax: [phone]  Is this the correct pharmacy for this prescription? Yes If no, delete pharmacy and type the correct one.   Has the prescription been filled recently? No  [facility] - [place], [place] - [phone] [address] [place] [address]   Is the patient out of the medication? No  Has the patient been seen for an appointment in the last year OR does the patient have an upcoming appointment? Yes  Can we respond through MyChart? No  Agent: Please be advised that Rx refills may take up to 3 business days. We ask that you follow-up with your pharmacy.

## 2023-11-27 MED ORDER — BREZTRI AEROSPHERE 160-9-4.8 MCG/ACT IN AERO: 2.0000 | INHALATION_SPRAY | Freq: Two times a day (BID) | RESPIRATORY_TRACT | 3 refills | Status: AC

## 2023-11-27 NOTE — Telephone Encounter (Signed)
 Tried calling pt- unavailable phone number. Refill sent to pharm.  Sent [employer].

## 2023-11-30 ENCOUNTER — Ambulatory Visit: Admitting: Pulmonary Disease

## 2023-12-01 DIAGNOSIS — M797 Fibromyalgia: Secondary | ICD-10-CM | POA: Diagnosis not present

## 2023-12-01 DIAGNOSIS — M069 Rheumatoid arthritis, unspecified: Secondary | ICD-10-CM | POA: Diagnosis not present

## 2023-12-01 DIAGNOSIS — R52 Pain, unspecified: Secondary | ICD-10-CM | POA: Diagnosis not present

## 2023-12-13 ENCOUNTER — Other Ambulatory Visit: Payer: Self-pay | Admitting: Nurse Practitioner

## 2023-12-13 DIAGNOSIS — K219 Gastro-esophageal reflux disease without esophagitis: Secondary | ICD-10-CM

## 2023-12-14 DIAGNOSIS — R52 Pain, unspecified: Secondary | ICD-10-CM | POA: Diagnosis not present

## 2023-12-14 DIAGNOSIS — Z7189 Other specified counseling: Secondary | ICD-10-CM | POA: Diagnosis not present

## 2023-12-14 DIAGNOSIS — E7800 Pure hypercholesterolemia, unspecified: Secondary | ICD-10-CM | POA: Diagnosis not present

## 2023-12-14 DIAGNOSIS — Z299 Encounter for prophylactic measures, unspecified: Secondary | ICD-10-CM | POA: Diagnosis not present

## 2023-12-14 DIAGNOSIS — Z713 Dietary counseling and surveillance: Secondary | ICD-10-CM | POA: Diagnosis not present

## 2023-12-14 DIAGNOSIS — Z0000 Encounter for general adult medical examination without abnormal findings: Secondary | ICD-10-CM | POA: Diagnosis not present

## 2023-12-14 DIAGNOSIS — R5383 Other fatigue: Secondary | ICD-10-CM | POA: Diagnosis not present

## 2023-12-14 DIAGNOSIS — Z79899 Other long term (current) drug therapy: Secondary | ICD-10-CM | POA: Diagnosis not present

## 2023-12-15 LAB — COMPREHENSIVE METABOLIC PANEL WITH GFR: EGFR (African American): 76

## 2023-12-19 ENCOUNTER — Telehealth: Payer: Self-pay | Admitting: Nutrition

## 2023-12-19 ENCOUNTER — Telehealth: Payer: Self-pay | Admitting: *Deleted

## 2023-12-19 NOTE — Telephone Encounter (Signed)
 Patient was called and made aware of Whitney's recommendations. She stated that she was going to call her PCP and ask that they do the blood work for her parathyroid.Aaron Aas

## 2023-12-19 NOTE — Telephone Encounter (Signed)
 Looks like it was elevated back in March too.  Previously (in December) it was normal.  Has she been taking Tums or Rolaids?  Any multivitamins?  If so, I recommend she stop those and then have her PCP repeat labs to see if levels improve, if not, he may want to add a parathyroid hormone count on the next set of labs too.  The thyroid  itself does not typically cause calcium  issues, but the parathyroid glands can (which are located within the thyroid  gland).

## 2023-12-19 NOTE — Telephone Encounter (Signed)
 Patient left a message. She wants to Whitney to know that her doctor did blood work and then called her, to share that he Calcium  is high. She states that she does not intake a lot of milk, that she is actually lactose intolerant. She is asking , if her thyroid  condition may be causing the increase in her Calcium  level?

## 2023-12-19 NOTE — Telephone Encounter (Signed)
 TC to remind of appt. No answer. VM full and can't leave message.

## 2023-12-20 ENCOUNTER — Encounter: Payer: 59 | Attending: Nurse Practitioner | Admitting: Nutrition

## 2023-12-20 VITALS — Ht 67.5 in | Wt 217.0 lb

## 2023-12-20 DIAGNOSIS — E782 Mixed hyperlipidemia: Secondary | ICD-10-CM | POA: Insufficient documentation

## 2023-12-20 DIAGNOSIS — I10 Essential (primary) hypertension: Secondary | ICD-10-CM | POA: Diagnosis not present

## 2023-12-20 DIAGNOSIS — E66812 Obesity, class 2: Secondary | ICD-10-CM | POA: Diagnosis present

## 2023-12-20 DIAGNOSIS — E118 Type 2 diabetes mellitus with unspecified complications: Secondary | ICD-10-CM | POA: Diagnosis not present

## 2023-12-20 NOTE — Progress Notes (Addendum)
 Medical Nutrition Therapy  Appointment Start time:  1115  Appointment End time:  1150  Primary concerns today: Dm Type 2, Obesity,   Referral diagnosis: E11.8, E66.9 Preferred learning style: No preference. Learning readiness: Changes in progress    NUTRITION ASSESSMENT Follow up She is here with her son and granddaughter. "I lost another 2 lbs." She is down 6 lbs since last vist 4 months ago. She is growing a garden and eating lot more food from the garden. Struggles eating meals on time and taking time for herself. Stays up late at night. Only getting 2-4 hrs of sleep usually. Stays tired. Not eating 3 meals per day. "I don't have time to eat. I am doing too much other stuff." Reports being  easily distracted in doing other things. Avoiding processed foods. Requested labs from PCP. Don't have a A1C listed.  >> A1C reported to be 5.5% per patient., down from 6.3%. Anthropometrics    Wt Readings from Last 3 Encounters:  12/20/23 217 lb (98.4 kg)  08/31/23 223 lb 6.4 oz (101.3 kg)  06/05/23 232 lb 3.2 oz (105.3 kg)   Ht Readings from Last 3 Encounters:  12/20/23 5' 7.5" (1.715 m)  08/31/23 5' 8.5" (1.74 m)  06/05/23 5\' 8"  (1.727 m)   Body mass index is 33.49 kg/m. @BMIFA @ Facility age limit for growth %iles is 20 years. Facility age limit for growth %iles is 20 years.   Clinical Lab Results  Component Value Date   HGBA1C 6.3 (H) 12/30/2022      Latest Ref Rng & Units 06/22/2023   12:00 AM 12/30/2022    4:38 AM 12/29/2022    5:54 AM  CMP  Glucose 70 - 99 mg/dL  119  147   BUN 6 - 20 mg/dL  13  18   Creatinine 8.29 - 1.00 mg/dL  5.62  1.30   Sodium 865 - 145 mmol/L  133  134   Potassium 3.5 - 5.1 mmol/L  4.5  3.4   Chloride 98 - 111 mmol/L  102  104   CO2 22 - 32 mmol/L  23  24   Calcium   9.9     9.4  8.6   Total Protein 6.5 - 8.1 g/dL   6.5   Total Bilirubin 0.3 - 1.2 mg/dL   0.2   Alkaline Phos 38 - 126 U/L   77   AST 15 - 41 U/L   14   ALT 0 - 44 U/L   16       This result is from an external source.   Medical Hx: Bipolar disease, high cholesterol, incontinence, anxiety, depression, OCD Medications: See Chart Labs: Last A1c 6 ,  Notable Signs/Symptoms: see cahrt  Lifestyle & Dietary Hx   Estimated daily fluid intake: 40-64 oz Supplements: Folic acid , Vit B 12, MVI, Vit E Sleep: 6 Stress / self-care: Financial  Current average weekly physical activity: Has beem walking some.  24-Hr Dietary Recall Noon pasta with broccoli and tea and tumeric teat with honey 1015 pm; 6" sub [place], lettuce, chceese, o/l, fry an water   Estimated Energy Needs Calories: 1200 Carbohydrate: 135g Protein: 90g Fat: 33g   NUTRITION DIAGNOSIS  NB-1.1 Food and nutrition-related knowledge deficit As related to Obesity.  As evidenced by BMI 37 .   NUTRITION INTERVENTION  Nutrition education (E-1) on the following topics:  Lifestyle Medicine  - Whole Food, Plant Predominant Nutrition is highly recommended: Eat Plenty of vegetables, Mushrooms, fruits, Legumes, Whole Grains,  Nuts, seeds in lieu of processed meats, processed snacks/pastries red meat, poultry, eggs.    -It is better to avoid simple carbohydrates including: Cakes, Sweet Desserts, Ice Cream, Soda (diet and regular), Sweet Tea, Candies, Chips, Cookies, Store Bought Juices, Alcohol in Excess of  1-2 drinks a day, Lemonade,  Artificial Sweeteners, Doughnuts, Coffee Creamers, "Sugar-free" Products, etc, etc.  This is not a complete list.....  Exercise: If you are able: 30 -60 minutes a day ,4 days a week, or 150 minutes a week.  The longer the better.  Combine stretch, strength, and aerobic activities.  If you were told in the past that you have high risk for cardiovascular diseases, you may seek evaluation by your heart doctor prior to initiating moderate to intense exercise programs.   Handouts Provided Include  Lifestyle Medicine Meal Plan Card  Learning Style & Readiness for Change Teaching  method utilized: Visual & Auditory  Demonstrated degree of understanding via: Teach Back  Barriers to learning/adherence to lifestyle change: none  Goals Established by Pt Goals Eat three meals per day Get to bed by 11 pm and get up at 8 am Exercise 30 minutes daily. Don't skip meals. Enjoy your garden!!   MONITORING & EVALUATION Dietary intake, weekly physical activity, and weight in 6 months.  Next Steps  Patient is to work on meal planning and exercise.Aaron Aas

## 2023-12-20 NOTE — Patient Instructions (Signed)
 Goals Eat three meals per day Get to bed by 11 pm and get up at 8 am Exercise 30 minutes daily. Don't skip meals. Enjoy your garden!!

## 2023-12-21 ENCOUNTER — Encounter: Payer: Self-pay | Admitting: Nutrition

## 2023-12-21 DIAGNOSIS — E1165 Type 2 diabetes mellitus with hyperglycemia: Secondary | ICD-10-CM | POA: Diagnosis not present

## 2023-12-21 DIAGNOSIS — Z299 Encounter for prophylactic measures, unspecified: Secondary | ICD-10-CM | POA: Diagnosis not present

## 2023-12-21 DIAGNOSIS — E1169 Type 2 diabetes mellitus with other specified complication: Secondary | ICD-10-CM | POA: Diagnosis not present

## 2023-12-21 DIAGNOSIS — M069 Rheumatoid arthritis, unspecified: Secondary | ICD-10-CM | POA: Diagnosis not present

## 2023-12-21 DIAGNOSIS — R42 Dizziness and giddiness: Secondary | ICD-10-CM | POA: Diagnosis not present

## 2023-12-22 ENCOUNTER — Telehealth: Payer: Self-pay | Admitting: *Deleted

## 2023-12-22 DIAGNOSIS — E041 Nontoxic single thyroid nodule: Secondary | ICD-10-CM

## 2023-12-22 NOTE — Telephone Encounter (Signed)
 Sent to [facility] for review.

## 2023-12-22 NOTE — Telephone Encounter (Signed)
 Patient was called and made aware that her lab orders were sent to the [facility] .

## 2023-12-22 NOTE — Telephone Encounter (Signed)
 Patient called and left a message. She saw her PCP, and wanted to let Melva Stabile know that her A1C was 5.5. Also, she was told that the lad work to look at her Parathyroid, her PCP told here that we would need to order this. This will be done and the patient will be made aware.

## 2023-12-28 ENCOUNTER — Encounter: Payer: Self-pay | Admitting: Internal Medicine

## 2023-12-28 ENCOUNTER — Ambulatory Visit

## 2023-12-28 ENCOUNTER — Ambulatory Visit: Payer: 59 | Attending: Internal Medicine | Admitting: Internal Medicine

## 2023-12-28 VITALS — BP 111/74 | HR 68 | Resp 16 | Ht 67.0 in | Wt 217.0 lb

## 2023-12-28 DIAGNOSIS — E559 Vitamin D deficiency, unspecified: Secondary | ICD-10-CM

## 2023-12-28 DIAGNOSIS — M25561 Pain in right knee: Secondary | ICD-10-CM

## 2023-12-28 DIAGNOSIS — M069 Rheumatoid arthritis, unspecified: Secondary | ICD-10-CM

## 2023-12-28 DIAGNOSIS — G8929 Other chronic pain: Secondary | ICD-10-CM

## 2023-12-28 DIAGNOSIS — M25562 Pain in left knee: Secondary | ICD-10-CM

## 2023-12-28 DIAGNOSIS — M170 Bilateral primary osteoarthritis of knee: Secondary | ICD-10-CM | POA: Insufficient documentation

## 2023-12-28 NOTE — Patient Instructions (Addendum)
 Methotrexate  Tablets What is this medication? METHOTREXATE  (METH oh TREX ate) treats autoimmune conditions, such as arthritis and psoriasis. It works by decreasing inflammation, which can reduce pain and prevent long-term injury to the joints and skin. It may also be used to treat some types of cancer. It works by slowing down the growth of cancer cells. This medicine may be used for other purposes; ask your health care provider or pharmacist if you have questions. COMMON BRAND NAME(S): Rheumatrex, Trexall  What should I tell my care team before I take this medication? They need to know if you have any of these conditions: Dehydration Diabetes Fluid in the stomach area or lungs Frequently drink alcohol Having surgery, including dental surgery High cholesterol Immune system problems Inflammatory bowel disease, such as ulcerative colitis Kidney disease Liver disease Low blood cell levels (white cells, red cells, and platelets) Lung disease Recent or ongoing radiation Recent or upcoming vaccine Stomach ulcers, other stomach or intestine problems An unusual or allergic reaction to methotrexate , other medications, foods, dyes, or preservatives Pregnant or trying to get pregnant Breastfeeding How should I use this medication? Take this medication by mouth with water. Take it as directed on the prescription label. Do not take extra. Keep taking this medication until your care team tells you to stop. Know why you are taking this medication and how you should take it. To treat conditions such as arthritis and psoriasis, this medication is taken ONCE A WEEK as a single dose or divided into 3 smaller doses taken 12 hours apart (do not take more than 3 doses 12 hours apart each week). This medication is NEVER taken daily to treat conditions other than cancer. Taking this medication more often than directed can cause serious side effects, even death. Talk to your care team about why you are taking this  medication, how often you will take it, and what your dose is. Ask your care team to put the reason you take this medication on the prescription. If you take this medication ONCE A WEEK, choose a day of the week before you start. Ask your pharmacist to include the day of the week on the label. Avoid Monday, which could be misread as Morning. Handling this medication may be harmful. Talk to your care team about how to handle this medication. Special instructions may apply. Talk to your care team about the use of this medication in children. While it may be prescribed for selected conditions, precautions do apply. Overdosage: If you think you have taken too much of this medicine contact a poison control center or emergency room at once. NOTE: This medicine is only for you. Do not share this medicine with others. What if I miss a dose? If you miss a dose, talk with your care team. Do not take double or extra doses. What may interact with this medication? Do not take this medication with any of the following: Acitretin Live virus vaccines Probenecid This medication may also interact with the following: Alcohol Aspirin and aspirin-like medications Certain antibiotics, such as penicillin, neomycin, sulfamethoxazole; trimethoprim Certain medications for stomach problems, such as lansoprazole, omeprazole, pantoprazole  Clozapine Cyclosporine Dapsone Folic acid  Foscarnet NSAIDs, medications for pain and inflammation, such as ibuprofen  or naproxen Phenytoin Pyrimethamine Steroid medications, such as prednisone  or cortisone Tacrolimus Theophylline This list may not describe all possible interactions. Give your health care provider a list of all the medicines, herbs, non-prescription drugs, or dietary supplements you use. Also tell them if you smoke, drink alcohol, or use  illegal drugs. Some items may interact with your medicine. What should I watch for while using this medication? Visit your  care team for regular checks on your progress. It may be some time before you see the benefit from this medication. You may need blood work done while you are taking this medication. If your care team has also prescribed folic acid , they may instruct you to skip your folic acid  dose on the day you take methotrexate . This medication can make you more sensitive to the sun. Keep out of the sun. If you cannot avoid being in the sun, wear protective clothing and sunscreen. Do not use sun lamps, tanning beds, or tanning booths. Check with your care team if you have severe diarrhea, nausea, and vomiting, or if you sweat a lot. The loss of too much body fluid may make it dangerous for you to take this medication. This medication may increase your risk of getting an infection. Call your care team for advice if you get a fever, chills, sore throat, or other symptoms of a cold or flu. Do not treat yourself. Try to avoid being around people who are sick. Talk to your care team about your risk of cancer. You may be more at risk for certain types of cancers if you take this medication. Talk to your care team if you or your partner may be pregnant. Serious birth defects can occur if you take this medication during pregnancy and for 6 months after the last dose. You will need a negative pregnancy test before starting this medication. Contraception is recommended while taking this medication and for 6 months after the last dose. Your care team can help you find the option that works for you. If your partner can get pregnant, use a condom during sex while taking this medication and for 3 months after the last dose. Do not breastfeed while taking this medication and for 1 week after the last dose. This medication may cause infertility. Talk to your care team if you are concerned about your fertility. What side effects may I notice from receiving this medication? Side effects that you should report to your care team as soon  as possible: Allergic reactions--skin rash, itching, hives, swelling of the face, lips, tongue, or throat Dry cough, shortness of breath or trouble breathing Infection--fever, chills, cough, sore throat, wounds that don't heal, pain or trouble when passing urine, general feeling of discomfort or being unwell Kidney injury--decrease in the amount of urine, swelling of the ankles, hands, or feet Liver injury--right upper belly pain, loss of appetite, nausea, light-colored stool, dark yellow or brown urine, yellowing skin or eyes, unusual weakness or fatigue Low red blood cell level--unusual weakness or fatigue, dizziness, headache, trouble breathing Pain, tingling, or numbness in the hands or feet, muscle weakness, change in vision, confusion or trouble speaking, loss of balance or coordination, trouble walking, seizures Redness, blistering, peeling, or loosening of the skin, including inside the mouth Stomach bleeding--bloody or black, tar-like stools, vomiting blood or brown material that looks like coffee grounds Stomach pain that is severe, does not go away, or gets worse Unusual bruising or bleeding Side effects that usually do not require medical attention (report these to your care team if they continue or are bothersome): Diarrhea Dizziness Hair loss Nausea Pain, redness, or swelling with sores inside the mouth or throat Skin reactions on sun-exposed areas Vomiting This list may not describe all possible side effects. Call your doctor for medical advice about side effects.  You may report side effects to FDA at 1-800-FDA-1088. Where should I keep my medication? Keep out of the reach of children and pets. Store at room temperature between 20 and 25 degrees C (68 and 77 degrees F). Protect from light. Keep the container tightly closed. Get rid of any unused medication after the expiration date. To get rid of medications that are no longer needed or have expired: Take the medication to a  medication take-back program. Check with your pharmacy or law enforcement to find a location. If you cannot return the medication, ask your pharmacist or care team how to get rid of this medication safely. NOTE: This sheet is a summary. It may not cover all possible information. If you have questions about this medicine, talk to your doctor, pharmacist, or health care provider.  2024 Elsevier/Gold Standard (2023-06-16 00:00:00)I recommend checking out the [employer]  patient-centered guide for fibromyalgia and chronic pain management: https://howell-gardner.net/

## 2023-12-28 NOTE — Progress Notes (Signed)
 Office Visit Note  Patient: [name]             Date of Birth: 02-20-1971           MRN: [id number]             PCP: Maree Isles, MD Referring: Leavy Waddell NOVAK, FNP Visit Date: 12/28/2023   Subjective:  New Patient (Initial Visit) (Patient states she has pain in her hands, wrists, knees, ankles, neck, shoulder, elbows.)   Discussed the use of AI scribe software for clinical note transcription with the patient, who gave verbal consent to proceed.  History of Present Illness   [name] is a 53 year old female with rheumatoid arthritis and fibromyalgia who presents with worsening joint pain and swelling.  She has a long-standing history of joint pain initially attributed to fibromyalgia, affecting multiple joints including wrists, knees, hips, shoulders, and ankles. The pain is exacerbated by physical activity and weather changes, particularly cold and rainy conditions. She experiences difficulty with daily activities such as opening jars, tying shoes, and personal grooming due to weakness and swelling in her hands and wrists.  She has been on methotrexate  for almost two years, currently 25 mg PO weekly, but feels it is no longer effective in managing her symptoms. She also takes Cymbalta  60 mg daily, which she started about six months ago, and has previously been on prednisone , which she discontinued due to weight gain. She uses various topical treatments like lidocaine  patches and Voltaren gel to manage pain.  She has a history of joint injections in her knees and back, which were helpful but are no longer covered by her insurance. She has not had recent imaging studies but recalls previous evaluations where her bloodwork and possibly ultrasounds were abnormal. She has not had fluid removed from her knees despite significant swelling.  Additional symptoms include anxiety, swelling of glands, and numbness and tingling in her hands and feet. She has a history of  falling, including a recent fall in a doctor's office, and uses a cane primarily in her left hand to assist with mobility. She has also been diagnosed with plantar fasciitis, which complicates her ability to wear supportive footwear. No specific joint injuries or surgeries.     11/2023 CBC wnl CMP unremarkable  05/2020 HBV neg HCV neg  10/2022 ESR 50 ANA neg RF neg CCP neg  05/2018 Vit D 11.3  Activities of Daily Living:  Patient reports morning stiffness for 1 hour.   Patient Reports nocturnal pain.  Difficulty dressing/grooming: Reports Difficulty climbing stairs: Reports Difficulty getting out of chair: Reports Difficulty using hands for taps, buttons, cutlery, and/or writing: Reports  Review of Systems  Constitutional:  Positive for fatigue.  HENT:  Positive for mouth sores and mouth dryness.   Eyes:  Positive for dryness.  Respiratory:  Positive for shortness of breath.   Cardiovascular:  Positive for chest pain and palpitations.  Gastrointestinal:  Negative for blood in stool, constipation and diarrhea.  Endocrine: Negative for increased urination.  Genitourinary:  Positive for involuntary urination.  Musculoskeletal:  Positive for joint pain, gait problem, joint pain, joint swelling, myalgias, muscle weakness, morning stiffness, muscle tenderness and myalgias.  Skin:  Positive for rash and sensitivity to sunlight. Negative for color change and hair loss.  Allergic/Immunologic: Positive for susceptible to infections.  Neurological:  Positive for dizziness and headaches.  Hematological:  Negative for swollen glands.  Psychiatric/Behavioral:  Positive for depressed mood and sleep disturbance. The  patient is nervous/anxious.     PMFS History:  Patient Active Problem List   Diagnosis Date Noted   Vitamin D  deficiency 12/28/2023   Bilateral knee pain 12/28/2023   Insomnia 09/01/2023   DOE (dyspnea on exertion) 09/01/2023   CAP (community acquired pneumonia)  12/29/2022   Essential hypertension 12/29/2022   Hypokalemia 12/29/2022   Pneumonia 12/29/2022   Mild obstructive sleep apnea 10/12/2022   Allergic rhinitis 10/12/2022   Respiratory syncytial virus (RSV) infection 05/25/2022   Gastroesophageal reflux disease 05/25/2022   Depression 05/25/2022   Rheumatoid arthritis (HCC) 05/25/2022   Impaired glucose tolerance 05/24/2022   Severe persistent asthma 05/23/2022   Hyperglycemia 05/23/2022   Mixed hyperlipidemia 05/23/2022   Uterine cramping 09/05/2016   Moderate mixed bipolar I disorder (HCC) 11/07/2013    Past Medical History:  Diagnosis Date   Anemia    Anxiety    Arthritis    orthopedic   Asthma    Bipolar disorder (HCC)    Depression    Fibromyalgia    GERD (gastroesophageal reflux disease)    Gout    Hypotension    IBS (irritable bowel syndrome)    Migraine    PTSD (post-traumatic stress disorder)    on prazosin    Sleep apnea    Tonsillar hypertrophy    Uterine fibroid     Family History  Problem Relation Age of Onset   Depression Mother    Hypertension Mother    Hyperlipidemia Mother    Other Mother        bone degeneration   Hypertension Father    Hyperlipidemia Father    Heart attack Father    Diabetes Father    Depression Brother    Alcohol abuse Brother    Alcohol abuse Brother    Bipolar disorder Daughter    Bipolar disorder Son    ADD / ADHD Son    Bipolar disorder Son    ADD / ADHD Son    Asperger's syndrome Son    Breast cancer Neg Hx    Past Surgical History:  Procedure Laterality Date   ENDOMETRIAL ABLATION     TONSILLECTOMY AND ADENOIDECTOMY N/A 02/13/2018   Procedure: TONSILLECTOMY AND ADENOIDECTOMY;  Surgeon: Karis Clunes, MD;  Location: [facility];  Service: ENT;  Laterality: N/A;   TUBAL LIGATION     Social History   Social History Narrative   Not on file   There is no immunization history for the selected administration types on file for this patient.    Objective: Vital Signs: BP 111/74 (BP Location: Right Arm, Patient Position: Sitting, Cuff Size: Large)   Pulse 68   Resp 16   Ht 5' 7 (1.702 m)   Wt 217 lb (98.4 kg)   BMI 33.99 kg/m    Physical Exam  Eyes:     Conjunctiva/sclera: Conjunctivae normal.    Cardiovascular:     Rate and Rhythm: Normal rate and regular rhythm.  Pulmonary:     Effort: Pulmonary effort is normal.     Breath sounds: Normal breath sounds.   Musculoskeletal:     Right lower leg: No edema.     Left lower leg: No edema.  Lymphadenopathy:     Cervical: No cervical adenopathy.   Skin:    General: Skin is warm and dry.     Comments: Tenderness to pressure over shins and ankles No pitting edema, mild superficial varicosities   Neurological:     Mental Status: She is alert.  Psychiatric:        Mood and Affect: Mood normal.      Musculoskeletal Exam:  Neck full ROM no tenderness Tenderness to pressure around base of neck and across upper back muscles, some pain with overhead and behind back reaching Shoulders full ROM, tenderness to pressure more over proximal and lateral muscles Elbows full ROM no tenderness or swelling Wrists full ROM, right wrist slight swelling and tenderness to pressure Fingers full ROM, tenderness across MCP joints no effusions Knees full ROM, no effusion, right knee medial joint line tenderness to pressure Ankles full ROM no tenderness or swelling   Investigation: No additional findings.  Imaging: No results found.  Recent Labs: Lab Results  Component Value Date   WBC 10.6 (H) 12/30/2022   HGB 11.0 (L) 12/30/2022   PLT 281 12/30/2022   NA 133 (L) 12/30/2022   K 4.5 12/30/2022   CL 102 12/30/2022   CO2 23 12/30/2022   GLUCOSE 196 (H) 12/30/2022   BUN 13 12/30/2022   CREATININE 0.68 12/30/2022   BILITOT 0.2 (L) 12/29/2022   ALKPHOS 77 12/29/2022   AST 14 (L) 12/29/2022   ALT 16 12/29/2022   PROT 6.5 12/29/2022   ALBUMIN 3.1 (L) 12/29/2022    CALCIUM  9.9 06/22/2023   GFRAA 76 12/15/2023    Speciality Comments: No specialty comments available.  Procedures:  No procedures performed Allergies: Peanut-containing drug products, Tegretol [carbamazepine], Beef allergy, Lithium, Pork-derived products, Sweet potato, and Penicillins   Assessment / Plan:     Visit Diagnoses: Rheumatoid arthritis, involving unspecified site, unspecified whether rheumatoid factor present (HCC) - Plan: Sedimentation rate, C-reactive protein, Rheumatoid factor, Cyclic citrul peptide antibody, IgG, XR Hand 2 View Right, XR Hand 2 View Left, XR KNEE 3 VIEW RIGHT, XR KNEE 3 VIEW LEFT Chronic RA with suboptimal control on methotrexate . Concerns about liver function and differential diagnosis include osteoarthritis and fibromyalgia.  There is not much peripheral joint synovitis just some in the right wrist but does have pain in numerous other areas.  Getting x-ray workup for any evidence of focal osteopenia or erosions.  Recent labs reviewed including blood count and metabolic panel were appropriate for methotrexate . - Order blood tests for antibody and inflammation markers. - Order x-rays of hands, wrists, and knees. - Continue on methotrexate  25 mg p.o. weekly for now - Consider alternative RA medications if current treatment is insufficient.  Vitamin D  deficiency - Plan: VITAMIN D  25 Hydroxy (Vit-D Deficiency, Fractures) Checking vitamin D  level in setting of chronic inflammatory arthritis and previous prednisone  use.  Chronic pain of both knees Osteoarthritis Osteoarthritis with knee crepitus complicating pain assessment. - Order x-rays of knees.  Fibromyalgia Chronic fibromyalgia with widespread pain, challenging pain management due to symptom overlap with RA and osteoarthritis. On cymbalta  maybe partial benefit so far.  Anxiety Increased anxiety potentially linked to chronic pain and functional limitations.  Plantar fasciitis Plantar fasciitis causing  foot pain, also appears to be worsened by inadequate/inappropriate footwear.   Orders: Orders Placed This Encounter  Procedures   XR Hand 2 View Right   XR Hand 2 View Left   XR KNEE 3 VIEW RIGHT   XR KNEE 3 VIEW LEFT   Sedimentation rate   C-reactive protein   Rheumatoid factor   Cyclic citrul peptide antibody, IgG   VITAMIN D  25 Hydroxy (Vit-D Deficiency, Fractures)   No orders of the defined types were placed in this encounter.    Follow-Up Instructions: Return in about 2 months (around  02/27/2024) for New pt RA on MTX f/u 2mos.   Lonni LELON Ester, MD  Note - This record has been created using [employer].  Chart creation errors have been sought, but may not always  have been located. Such creation errors do not reflect on  the standard of medical care.

## 2023-12-29 LAB — C-REACTIVE PROTEIN: CRP: <3 mg/L (ref <8.0)

## 2023-12-29 LAB — SEDIMENTATION RATE: Sed Rate: 28 mm/h (ref 0–30)

## 2023-12-29 LAB — VITAMIN D 25 HYDROXY (VIT D DEFICIENCY, FRACTURES): Vit D, 25-Hydroxy: 37 ng/mL (ref 30–100)

## 2023-12-29 LAB — RHEUMATOID FACTOR: Rheumatoid fact SerPl-aCnc: <10 [IU]/mL (ref <14)

## 2023-12-29 LAB — CYCLIC CITRUL PEPTIDE ANTIBODY, IGG: Cyclic Citrullin Peptide Ab: <16 U

## 2024-01-04 DIAGNOSIS — R52 Pain, unspecified: Secondary | ICD-10-CM | POA: Diagnosis not present

## 2024-01-04 DIAGNOSIS — W57XXXA Bitten or stung by nonvenomous insect and other nonvenomous arthropods, initial encounter: Secondary | ICD-10-CM | POA: Diagnosis not present

## 2024-01-04 DIAGNOSIS — E1169 Type 2 diabetes mellitus with other specified complication: Secondary | ICD-10-CM | POA: Diagnosis not present

## 2024-01-04 DIAGNOSIS — M069 Rheumatoid arthritis, unspecified: Secondary | ICD-10-CM | POA: Diagnosis not present

## 2024-01-05 ENCOUNTER — Encounter: Payer: Self-pay | Admitting: Student

## 2024-01-13 DIAGNOSIS — R030 Elevated blood-pressure reading, without diagnosis of hypertension: Secondary | ICD-10-CM | POA: Diagnosis not present

## 2024-01-13 DIAGNOSIS — L03116 Cellulitis of left lower limb: Secondary | ICD-10-CM | POA: Diagnosis not present

## 2024-01-13 DIAGNOSIS — W57XXXA Bitten or stung by nonvenomous insect and other nonvenomous arthropods, initial encounter: Secondary | ICD-10-CM | POA: Diagnosis not present

## 2024-01-15 DIAGNOSIS — W57XXXA Bitten or stung by nonvenomous insect and other nonvenomous arthropods, initial encounter: Secondary | ICD-10-CM | POA: Diagnosis not present

## 2024-01-15 DIAGNOSIS — E1165 Type 2 diabetes mellitus with hyperglycemia: Secondary | ICD-10-CM | POA: Diagnosis not present

## 2024-01-15 DIAGNOSIS — L299 Pruritus, unspecified: Secondary | ICD-10-CM | POA: Diagnosis not present

## 2024-01-15 DIAGNOSIS — R531 Weakness: Secondary | ICD-10-CM | POA: Diagnosis not present

## 2024-01-16 ENCOUNTER — Other Ambulatory Visit: Payer: Self-pay | Admitting: Nurse Practitioner

## 2024-01-16 DIAGNOSIS — K219 Gastro-esophageal reflux disease without esophagitis: Secondary | ICD-10-CM

## 2024-01-16 DIAGNOSIS — A6920 Lyme disease, unspecified: Secondary | ICD-10-CM

## 2024-01-16 HISTORY — DX: Lyme disease, unspecified: A69.20

## 2024-01-29 ENCOUNTER — Telehealth: Payer: Self-pay | Admitting: *Deleted

## 2024-01-29 NOTE — Telephone Encounter (Signed)
 Patient contacted the office and requested a call back.  Returned call to patient. She states since her visit with Dr. Jeannetta she has had an increase in symptoms. Patient states she has been seen in the emergency room. Patient states they gave her Prednisone . Patient states she is having severe scalp tenderness. Patient states no blisters just sore to the touch. Patient states the prednisone  helped it but now that she has completed it her scalp is hurting again. Patient states she also feels like her face is sunburnt. Patient she is not using any chemicals on her face. Patient has appointment tomorrow 01/30/2024 at 8:20 am.

## 2024-01-30 ENCOUNTER — Ambulatory Visit: Admitting: Internal Medicine

## 2024-01-30 ENCOUNTER — Encounter: Payer: Self-pay | Admitting: Internal Medicine

## 2024-01-30 ENCOUNTER — Ambulatory Visit: Attending: Internal Medicine | Admitting: Internal Medicine

## 2024-01-30 ENCOUNTER — Telehealth: Payer: Self-pay | Admitting: Internal Medicine

## 2024-01-30 VITALS — Ht 68.0 in | Wt 211.0 lb

## 2024-01-30 DIAGNOSIS — M069 Rheumatoid arthritis, unspecified: Secondary | ICD-10-CM

## 2024-01-30 DIAGNOSIS — J441 Chronic obstructive pulmonary disease with (acute) exacerbation: Secondary | ICD-10-CM | POA: Diagnosis not present

## 2024-01-30 DIAGNOSIS — W57XXXA Bitten or stung by nonvenomous insect and other nonvenomous arthropods, initial encounter: Secondary | ICD-10-CM | POA: Diagnosis not present

## 2024-01-30 DIAGNOSIS — L568 Other specified acute skin changes due to ultraviolet radiation: Secondary | ICD-10-CM | POA: Diagnosis not present

## 2024-01-30 DIAGNOSIS — L659 Nonscarring hair loss, unspecified: Secondary | ICD-10-CM | POA: Diagnosis not present

## 2024-01-30 DIAGNOSIS — R21 Rash and other nonspecific skin eruption: Secondary | ICD-10-CM

## 2024-01-30 MED ORDER — PREDNISONE 10 MG PO TABS: ORAL_TABLET | ORAL | 0 refills | Status: AC

## 2024-01-30 MED ORDER — HYDROCORTISONE 2.5 % EX CREA: TOPICAL_CREAM | Freq: Two times a day (BID) | CUTANEOUS | 0 refills | Status: AC | PRN

## 2024-01-30 NOTE — Progress Notes (Addendum)
 Virtual Visit via Video Note  I connected with [name] on 02/14/24 at  1:20 PM EDT by a video enabled telemedicine application and verified that I am speaking with the correct person using two identifiers.  Location: Patient: [name] at home [address] [place], [place] Provider: Lonni Ester at [facility] rheumatology office [address] [place], [place]   I discussed the limitations of evaluation and management by telemedicine and the availability of in person appointments. The patient expressed understanding and agreed to proceed.   Discussed the use of AI scribe software for clinical note transcription with the patient, who gave verbal consent to proceed.  History of Present Illness: Meeting today by telemedicine visit to follow-up on her rheumatoid arthritis and methotrexate .  After starting medication she experienced worsening symptoms with joint pain in multiple areas but particularly breaking out with erythematous and painful rash on the face and scalp.  She received a course of oral prednisone  from the emergency department for this which partially improved her symptoms.  She has been experiencing significant scalp pain and a burning sensation on her face for the past month. The scalp pain is severe, affecting her entire head, and is associated with hair loss. She has a history of scaly patches on her scalp previously managed with medicated shampoos, but currently, there are no visible lesions despite the pain.  The burning sensation on her face is described as similar to a chemical burn, although there is no visible redness. Cold applications provide some relief. She has been unable to perform her skincare routine for over two weeks due to these symptoms.  She has a history of tick bites, with one bite from two years ago causing localized alopecia and inflammation. The area of the bite becomes raised, sore, and itchy, leading to hair loss. A recent tick bite  about a month ago was treated with prednisone  and doxycycline  after the area developed several rings and rashes and became very painful and itchy; the patient reports that the symptoms have improved since treatment.  She reports systemic symptoms including joint pain, particularly in her fingers, which are swollen and stiff in the morning but improve with massage and temperature therapy. She experiences generalized body aches, headaches, and fatigue, which have been exacerbated since the tick bite. She also mentions difficulty sleeping due to pain and discomfort, impacting her ability to care for her grandson.  She is concerned about weight gain associated with prednisone  use, as she is trying to manage her weight and maintain her non-diabetic status. She is cautious about introducing new medications due to her extensive medication regimen.    Observations/Objective:  Physical Exam Eyes:     Conjunctiva/sclera: Conjunctivae normal.  Pulmonary:     Effort: Pulmonary effort is normal.  Neurological:     Mental Status: She is alert.      Review of Systems  Constitutional:  Positive for malaise/fatigue.  Respiratory:  Positive for shortness of breath.   Cardiovascular:  Positive for palpitations. Negative for chest pain.  Gastrointestinal:  Negative for blood in stool, constipation and diarrhea.  Musculoskeletal:  Positive for joint pain.  Skin:  Positive for rash.  Neurological:  Positive for headaches. Negative for dizziness.  Psychiatric/Behavioral:  Positive for depression.      Assessment and Plan: Rheumatoid arthritis Rheumatoid arthritis with significant joint pain and stiffness. Current methotrexate  treatment may be insufficient. Consideration of sulfasalazine or injectable medications like Humira or Enbrel. Discussed benefits of injectables and insurance  concerns.  I would most strongly consider trial of sulfasalazine due to concern about medication side effects or strong  immunosuppression. - Prescribe oral prednisone  for two weeks for flare and possible methotrexate  drug reaction. - Evaluate response to prednisone  at next appointment. - Consider alternative treatments such as sulfasalazine or injectable medications if symptoms persist.  Tick bite with abscess and rash Recent tick bite with abscess and rash treated with prednisone  and doxycycline . Symptoms improved but itching persists. Discussed persistent infection very unlikely, had adequate doxycycline  empiric treatment even if exposed.  Photosensitivity due to doxycycline  Possible photosensitivity reaction from doxycycline  causing facial burning sensation. Discussed doxycycline 's potential to increase photosensitivity. - Prescribe hydrocortisone  2.5% cream for facial burning, to be applied twice daily.  Scalp pain and hair loss Scalp pain and hair loss possibly related to previous tick bites and alopecia. Pain exacerbated by inflammation.  Chronic asthma Chronic asthma with exacerbations, worsened post-COVID. Shortness of breath varies. Relies on breathing treatments and inhalers.  Diabetes mellitus Diabetes mellitus, currently not diabetic. Concern about weight gain from prednisone  affecting diabetes status. Emphasized weight management and dietary control.      Follow Up Instructions:    I discussed the assessment and treatment plan with the patient. The patient was provided an opportunity to ask questions and all were answered. The patient agreed with the plan and demonstrated an understanding of the instructions.   The patient was advised to call back or seek an in-person evaluation if the symptoms worsen or if the condition fails to improve as anticipated.  I provided 22 minutes of non-face-to-face time during this encounter.   Lonni LELON Ester, MD

## 2024-01-30 NOTE — Telephone Encounter (Signed)
 Pt called and cancelled her appointment this morning due to her not sleeping. Pt stated she can not do this pain anymore and needs to know what she should do. Pt would like to do a virtual visit if possible. Pt is wanting a call back.

## 2024-01-30 NOTE — Progress Notes (Deleted)
 Office Visit Note  Patient: [name]             Date of Birth: [birth year]           MRN: [id number]             PCP: Maree Isles, MD Referring: Maree Isles, MD Visit Date: 01/30/2024   Subjective:  No chief complaint on file.   History of Present Illness: [name] is a 53 y.o. female here for follow up ***   Previous HPI 12/28/23 [name] is a 54 year old female with rheumatoid arthritis and fibromyalgia who presents with worsening joint pain and swelling.   She has a long-standing history of joint pain initially attributed to fibromyalgia, affecting multiple joints including wrists, knees, hips, shoulders, and ankles. The pain is exacerbated by physical activity and weather changes, particularly cold and rainy conditions. She experiences difficulty with daily activities such as opening jars, tying shoes, and personal grooming due to weakness and swelling in her hands and wrists.   She has been on methotrexate  for almost two years, currently 25 mg PO weekly, but feels it is no longer effective in managing her symptoms. She also takes Cymbalta  60 mg daily, which she started about six months ago, and has previously been on prednisone , which she discontinued due to weight gain. She uses various topical treatments like lidocaine  patches and Voltaren gel to manage pain.   She has a history of joint injections in her knees and back, which were helpful but are no longer covered by her insurance. She has not had recent imaging studies but recalls previous evaluations where her bloodwork and possibly ultrasounds were abnormal. She has not had fluid removed from her knees despite significant swelling.   Additional symptoms include anxiety, swelling of glands, and numbness and tingling in her hands and feet. She has a history of falling, including a recent fall in a doctor's office, and uses a cane primarily in her left hand to assist with mobility. She has also  been diagnosed with plantar fasciitis, which complicates her ability to wear supportive footwear. No specific joint injuries or surgeries.    11/2023 CBC wnl CMP unremarkable   05/2020 HBV neg HCV neg   10/2022 ESR 50 ANA neg RF neg CCP neg   05/2018 Vit D 11.3   No Rheumatology ROS completed.   PMFS History:  Patient Active Problem List   Diagnosis Date Noted   Vitamin D  deficiency 12/28/2023   Bilateral knee pain 12/28/2023   Insomnia 09/01/2023   DOE (dyspnea on exertion) 09/01/2023   CAP (community acquired pneumonia) 12/29/2022   Essential hypertension 12/29/2022   Hypokalemia 12/29/2022   Pneumonia 12/29/2022   Mild obstructive sleep apnea 10/12/2022   Allergic rhinitis 10/12/2022   Respiratory syncytial virus (RSV) infection 05/25/2022   Gastroesophageal reflux disease 05/25/2022   Depression 05/25/2022   Rheumatoid arthritis (HCC) 05/25/2022   Impaired glucose tolerance 05/24/2022   Severe persistent asthma 05/23/2022   Hyperglycemia 05/23/2022   Mixed hyperlipidemia 05/23/2022   Uterine cramping 09/05/2016   Moderate mixed bipolar I disorder (HCC) 11/07/2013    Past Medical History:  Diagnosis Date   Anemia    Anxiety    Arthritis    orthopedic   Asthma    Bipolar disorder (HCC)    Depression    Fibromyalgia    GERD (gastroesophageal reflux disease)    Gout    Hypotension    IBS (irritable bowel syndrome)  Migraine    PTSD (post-traumatic stress disorder)    on prazosin    Sleep apnea    Tonsillar hypertrophy    Uterine fibroid     Family History  Problem Relation Age of Onset   Depression Mother    Hypertension Mother    Hyperlipidemia Mother    Other Mother        bone degeneration   Hypertension Father    Hyperlipidemia Father    Heart attack Father    Diabetes Father    Depression Brother    Alcohol abuse Brother    Alcohol abuse Brother    Bipolar disorder Daughter    Bipolar disorder Son    ADD / ADHD Son    Bipolar  disorder Son    ADD / ADHD Son    Asperger's syndrome Son    Breast cancer Neg Hx    Past Surgical History:  Procedure Laterality Date   ENDOMETRIAL ABLATION     TONSILLECTOMY AND ADENOIDECTOMY N/A 02/13/2018   Procedure: TONSILLECTOMY AND ADENOIDECTOMY;  Surgeon: Karis Clunes, MD;  Location: [facility];  Service: ENT;  Laterality: N/A;   TUBAL LIGATION     Social History   Social History Narrative   Not on file   There is no immunization history for the selected administration types on file for this patient.   Objective: Vital Signs: There were no vitals taken for this visit.   Physical Exam   Musculoskeletal Exam: ***  CDAI Exam: CDAI Score: -- Patient Global: --; Provider Global: -- Swollen: --; Tender: -- Joint Exam 01/30/2024   No joint exam has been documented for this visit   There is currently no information documented on the homunculus. Go to the Rheumatology activity and complete the homunculus joint exam.  Investigation: No additional findings.  Imaging: No results found.  Recent Labs: Lab Results  Component Value Date   WBC 10.6 (H) 12/30/2022   HGB 11.0 (L) 12/30/2022   PLT 281 12/30/2022   NA 133 (L) 12/30/2022   K 4.5 12/30/2022   CL 102 12/30/2022   CO2 23 12/30/2022   GLUCOSE 196 (H) 12/30/2022   BUN 13 12/30/2022   CREATININE 0.68 12/30/2022   BILITOT 0.2 (L) 12/29/2022   ALKPHOS 77 12/29/2022   AST 14 (L) 12/29/2022   ALT 16 12/29/2022   PROT 6.5 12/29/2022   ALBUMIN 3.1 (L) 12/29/2022   CALCIUM  9.9 06/22/2023   GFRAA 76 12/15/2023    Speciality Comments: No specialty comments available.  Procedures:  No procedures performed Allergies: Peanut-containing drug products, Tegretol [carbamazepine], Beef allergy, Lithium, Pork-derived products, Sweet potato, and Penicillins   Assessment / Plan:     Visit Diagnoses: No diagnosis found.  ***  Orders: No orders of the defined types were placed in this encounter.  No  orders of the defined types were placed in this encounter.    Follow-Up Instructions: No follow-ups on file.   Lonni LELON Ester, MD  Note - This record has been created using [employer].  Chart creation errors have been sought, but may not always  have been located. Such creation errors do not reflect on  the standard of medical care.

## 2024-01-30 NOTE — Telephone Encounter (Signed)
 Now scheduled for telemedicine visit this afternoon.

## 2024-02-09 ENCOUNTER — Other Ambulatory Visit: Payer: Self-pay | Admitting: Nurse Practitioner

## 2024-02-09 DIAGNOSIS — K219 Gastro-esophageal reflux disease without esophagitis: Secondary | ICD-10-CM

## 2024-02-14 ENCOUNTER — Ambulatory Visit (HOSPITAL_BASED_OUTPATIENT_CLINIC_OR_DEPARTMENT_OTHER): Admitting: Nurse Practitioner

## 2024-02-14 ENCOUNTER — Encounter (HOSPITAL_BASED_OUTPATIENT_CLINIC_OR_DEPARTMENT_OTHER): Payer: Self-pay | Admitting: Nurse Practitioner

## 2024-02-14 VITALS — BP 109/73 | HR 78 | Ht 68.0 in | Wt 210.0 lb

## 2024-02-14 DIAGNOSIS — F3162 Bipolar disorder, current episode mixed, moderate: Secondary | ICD-10-CM | POA: Diagnosis not present

## 2024-02-14 DIAGNOSIS — G4733 Obstructive sleep apnea (adult) (pediatric): Secondary | ICD-10-CM | POA: Diagnosis not present

## 2024-02-14 DIAGNOSIS — J4550 Severe persistent asthma, uncomplicated: Secondary | ICD-10-CM | POA: Diagnosis not present

## 2024-02-14 DIAGNOSIS — F5104 Psychophysiologic insomnia: Secondary | ICD-10-CM | POA: Diagnosis not present

## 2024-02-14 DIAGNOSIS — E66811 Obesity, class 1: Secondary | ICD-10-CM

## 2024-02-14 DIAGNOSIS — Z6831 Body mass index (BMI) 31.0-31.9, adult: Secondary | ICD-10-CM

## 2024-02-14 DIAGNOSIS — E6609 Other obesity due to excess calories: Secondary | ICD-10-CM

## 2024-02-14 MED ORDER — ALBUTEROL SULFATE (2.5 MG/3ML) 0.083% IN NEBU: 2.5000 mg | INHALATION_SOLUTION | RESPIRATORY_TRACT | 3 refills | Status: AC | PRN

## 2024-02-14 NOTE — Progress Notes (Unsigned)
 @Patient  ID: [name], female    DOB: August 14, 1970, 53 y.o.   MRN: [id number]  Chief Complaint  Patient presents with   Follow-up    Referring provider: Maree Isles, MD  HPI: 53 year old patient, never smoker followed for asthma and OSA. She is a patient of Dr. Luann and last seen 09/01/2023 by [facility] NP. Past medical history significant for GERD, RA on methotrexate , bipolar, depression.   TESTS/EVENTS: 10/06/2022 PFT: FVC 65, FEV1 68, ratio 88, TLC 74, DLCOcor 72 12/28/2022 CTA chest: No PE.  Moderate sized hiatal hernia, stable.  Borderline sized bilateral hilar and mediastinal lymph nodes.  1 cm low-density lesion in the right thyroid  lobe, smaller than prior study.  Parabronchial thickening.  Nodular groundglass opacities throughout the right lung, most pronounced in right lower lobe and posterior right upper lobe.  Few scattered groundglass nodularities in the left lung. Likely infectious/inflammatory 03/29/2023 HST: AHI 10.6/h, spO2 low 70%  06/28/2022: OV with Dr. Kara for hospital follow up. She was admitted 11/6-11/8 for asthma exacerbation due to RSV infection. She had been having DOE since    10/10/2022: Ov with Jordan Macias NP for follow up after undergoing PFTs and HST. She was found to have a restrictive defect and minimal diffusion defect on her pulmonary function testing. Her home sleep study revealed mildly moderate OSA. She has never been treated for this with CPAP before.  Today, she tells me that she was doing much better after she started the Breztri  inhaler. Over the past few days (starting approx 3/21), she developed sinus congestion, sneezing, watery eyes. She thought it was allergies. She now has a cough and is feeling like her chest is a little tighter, especially when she lays back. She also notices more wheezing at night. She feels like she gets more short winded with activities around the house but doesn't feel like her shortness of breath is severe. She denies any  fevers, chills, hemoptysis, leg swelling. She is using her Breztri  twice a day. Increased use of albuterol  over the past few days. She is not currently on anything for allergies.  Regarding her sleep, she feels very tired during the day. She snores at night. She denies any drowsy driving or morning headaches. She would like to know what treatment options there are.   02/01/2023: OV with Jordan Lingafelter NP for follow up.  She was recently hospitalized from 12/28/2022-12/30/2022 for pneumonia treated with cefdinir  and azithromycin .  Prior to this she had been seen in the emergency department 12/25/2022 for myalgias/arthralgias.  She had, had tick bites prior to this and had been on doxycycline .  She was provided with Norco and discharged home.  She also had viral testing that was negative. She tells me today that she is finally starting to feel better.  She is still having some more shortness of breath compared to her baseline and she does not feel like her stamina is quite back to where it was before.  She does still have an occasional cough but it is primarily dry.  Feels like it is improving.  She has not noticed quite as much wheezing.  Denies any fevers, chills, hemoptysis.  Myalgias and arthralgias are better as well.  She is still using her nebs twice a day.  She is been able to back off from 4 times a day.  Still on Breztri  twice a day.  Is having difficulties with her taste and smell.  Does not feel like she can taste much of anything  and she is not able to smell her candles like she usually is.  She did test negative for COVID so she is not sure why this is.  Does have some sinus symptoms which do not feel much worse than her baseline.  She was using Flonase  a few times a day but then it made her nosebleed. She has been having trouble with her CPAP due to not feeling like she is getting enough air.  She says that she thought this was related to recovering from pneumonia but then she also thinks that it might be a mass  problem.  She is used a couple different masks.  Currently settled on nasal pillow which she still does not always feel it gives her enough air at times.  She tends to have to open her mouth to breathe more easily.  She denies any drowsy driving or morning headaches. 01/01/2023-01/30/2023: CPAP auto 5-15 cmH2O 13/30 days; 17% > 4 hours; average use 3 hours 28 minutes Pressure 95th 10.1 Leaks 95th 8.2 AHI 1.9  03/09/2023: OV with Jordan Sudduth NP for follow-up.  Has been doing better since she was here last.  She does still have some occasional shortness of breath and sometimes feels like her breath catches.  Notices it more so when she is outside and it is hot.  Cough is mostly gone.  Not noticing any wheezing or chest congestion.  She did have an RA flare and is currently on prednisone  for this.  Denies any fevers, chills, hemoptysis, lower extremity swelling, orthopnea.  She is on Breztri  twice a day.  Taste and smell is getting better.  She has been using Flonase , which helped her sinus symptoms but she does occasionally still have some postnasal drainage.  She feels like this is primarily her allergies that bother her.  She is taking a daily allergy pill which does help. She feels like she has been doing better with her CPAP.  Wearing it most nights.  Occasionally will wake up with it off.  She has been getting calls from [employer] about not wearing her CPAP enough.  She is confused about this because she has been wearing it more consistently.  She was in the hospital before so she is not sure if this is the compliance problem they are talking about.  Denies any drowsy driving or morning headaches.  She does receive benefit from using her CPAP. 02/07/2023-03/08/2023: CPAP 8-20 cmH2O 29/30 days; 83% >4 hr; average use 6 hr 28 min Pressure 95th 15 Leaks 12.8 AHI 2.3   02/14/2024: Today - follow up Discussed the use of AI scribe software for clinical note transcription with the patient, who gave verbal consent to  proceed.  History of Present Illness [name] is a 53 year old female with sleep apnea who presents for follow-up on CPAP usage and sleep issues.  She has been inconsistent with CPAP usage due to issues with replacement parts and the machine being unplugged. Her grandson recently helped fix the machine, improving its function, but she still requires a new nose piece and hose, which have not been sent by the medical supply company despite multiple calls. When she uses the CPAP, she wakes up with more energy and feels more active.  She has a history of Lyme disease and rheumatoid arthritis, which sometimes causes pain at night, leading her to remove the CPAP mask. Her grandson has noticed that she stops breathing during the night, which concerns him.  She experiences sleep disturbances, including  nightmares, so they have adjusted her prazosin . She has tried taking Lunesta  for sleep, which helps if she does not eat before taking it. However, eating heavier meals at night reduces its effectiveness and this is typically when she eats. She has bipolar disorder and tends to sleep more during the day and is awake more at night. She also reports seasonal changes affecting her sleep patterns, with increased mania and reduced sleep duration, sometimes only sleeping three hours a night. She denies any SI/HI. No worsening mood symptoms. She has not discussed sleep medications with her psychiatrist. No drowsy driving or sleep parasomnias.   She reports being more active when her rheumatoid arthritis symptoms are not flaring. Her breathing is feeling stable for the most part. No recent flares requiring steroids or abx. No hospitalizations. No cough, wheezing, chest congestion, fevers, hemoptysis. She occasionally uses a nebulizer and rescue inhaler but reports not needing them frequently. She continues to use Breztri  for her breathing issues. The heat tends to make her breathing worse, so she will work in her  garden in the evenings/night. She has a light in her yard.   11/15/2023-02/12/2024 CPAP 7-10 cmH2O 49/90 days; 42% >4 hr; average use 6 hr 7 min Pressure 95th 9.6 Leaks 95th 16 AHI 2   Allergies  Allergen Reactions   Peanut-Containing Drug Products Anaphylaxis    Swelling    Tegretol [Carbamazepine] Shortness Of Breath and Swelling   Beef Allergy     Does not eat red meat   Lithium Other (See Comments)    Bad acne    Pork-Derived Products Other (See Comments)    Does not eat pork products   Sweet Potato Itching   Penicillins Rash    There is no immunization history for the selected administration types on file for this patient.  Past Medical History:  Diagnosis Date   Anemia    Anxiety    Arthritis    orthopedic   Asthma    Bipolar disorder (HCC)    Depression    Fibromyalgia    GERD (gastroesophageal reflux disease)    Gout    Hypotension    IBS (irritable bowel syndrome)    Lyme disease 01/16/2024   Migraine    PTSD (post-traumatic stress disorder)    on prazosin    Sleep apnea    Tonsillar hypertrophy    Uterine fibroid     Tobacco History: Social History   Tobacco Use  Smoking Status Never   Passive exposure: Past  Smokeless Tobacco Never   Counseling given: Not Answered   Outpatient Medications Prior to Visit  Medication Sig Dispense Refill   acetaminophen  (TYLENOL ) 325 MG tablet Take 2 tablets (650 mg total) by mouth every 6 (six) hours as needed for mild pain (or Fever >/= 101). 100 tablet 0   albuterol  (VENTOLIN  HFA) 108 (90 Base) MCG/ACT inhaler Inhale 2 puffs into the lungs every 4 (four) hours as needed for wheezing or shortness of breath. 18 g 2   allopurinol  (ZYLOPRIM ) 300 MG tablet Take 300 mg by mouth daily.     ALPRAZolam  (XANAX ) 0.5 MG tablet Take 0.5 mg by mouth 2 (two) times daily as needed for anxiety.     atorvastatin  (LIPITOR) 40 MG tablet Take 40 mg by mouth daily. (Patient not taking: Reported on 01/30/2024)     azelastine   (ASTELIN ) 0.1 % nasal spray Place 2 sprays into both nostrils 2 (two) times daily. Use in each nostril as directed (Patient taking differently: Place 2  sprays into both nostrils as needed. Use in each nostril as directed) 30 mL 5   azithromycin  (ZITHROMAX ) 250 MG tablet Azithromycin  500mg  x 1 daily and then next 4 days is 250mg  daily x 4 days (Patient not taking: Reported on 01/30/2024) 6 tablet 0   budeson-glycopyrrolate-formoterol  (BREZTRI  AEROSPHERE) 160-9-4.8 MCG/ACT AERO inhaler Inhale 2 puffs into the lungs in the morning and at bedtime. 10.7 g 3   cholecalciferol (VITAMIN D3) 25 MCG (1000 UNIT) tablet Take 4,000 Units by mouth daily. (Patient not taking: Reported on 01/30/2024)     colchicine  0.6 MG tablet Take 0.6 mg by mouth daily.     diclofenac Sodium (VOLTAREN) 1 % GEL Apply 1 Application topically 4 (four) times daily.     DULoxetine  (CYMBALTA ) 60 MG capsule Take 60 mg by mouth daily.     famotidine  (PEPCID ) 20 MG tablet take 1 tablet (20 milligram total) by mouth at bedtime. 30 tablet 0   folic acid  (FOLVITE ) 1 MG tablet Take 1 mg by mouth daily.     furosemide  (LASIX ) 20 MG tablet Take 1 tablet (20 mg total) by mouth daily. 30 tablet 1   hydrocortisone  2.5 % cream Apply topically 2 (two) times daily as needed. 30 g 0   hydrOXYzine  (ATARAX ) 50 MG tablet Take 0.5 tablets (25 mg total) by mouth 2 (two) times daily as needed for itching or anxiety. 30 tablet 3   ipratropium-albuterol  (DUONEB) 0.5-2.5 (3) MG/3ML SOLN Take 3 mLs by nebulization every 6 (six) hours as needed. 360 mL 3   LATUDA  40 MG TABS tablet Take 40 mg by mouth at bedtime. (Patient not taking: Reported on 01/30/2024)     levocetirizine (XYZAL ) 5 MG tablet take 1 tablet (5 MILLIGRAM total) by mouth daily. 30 tablet 11   Lurasidone  HCl 60 MG TABS Take 1 tablet by mouth daily.     metFORMIN  (GLUCOPHAGE ) 500 MG tablet Take 1 tablet (500 mg total) by mouth daily with breakfast. (Patient not taking: Reported on 01/30/2024) 90 tablet  3   methotrexate  2.5 MG tablet Take 25 mg by mouth once a week. Caution:Chemotherapy. Protect from light.  Takes 10 per day once a week.     mirabegron  ER (MYRBETRIQ ) 25 MG TB24 tablet Take 25 mg by mouth daily.     MOUNJARO 7.5 MG/0.5ML Pen      Oxcarbazepine  (TRILEPTAL ) 300 MG tablet Take 1 tablet (300 mg total) by mouth 2 (two) times daily. 60 tablet 2   pantoprazole  (PROTONIX ) 40 MG tablet Take 1 tablet (40 mg total) by mouth daily. 30 tablet 1   prazosin  (MINIPRESS ) 5 MG capsule Take 5 mg by mouth 3 (three) times daily. @ Bedtime     pregabalin  (LYRICA ) 150 MG capsule Take 150 mg by mouth 2 (two) times daily.     promethazine (PHENERGAN) 25 MG tablet Take 25 mg by mouth every 6 (six) hours as needed.     Turmeric (QC TUMERIC COMPLEX) 500 MG CAPS Take by mouth.     UNABLE TO FIND daily. Med Name: Vitaglobe-Mushroom gummie     vitamin B-12 (CYANOCOBALAMIN ) 100 MCG tablet Take 100 mcg by mouth daily.     albuterol  (PROVENTIL ) (2.5 MG/3ML) 0.083% nebulizer solution Take 3 mLs (2.5 mg total) by nebulization every 4 (four) hours as needed for wheezing or shortness of breath. 75 mL 2   eszopiclone  (LUNESTA ) 1 MG TABS tablet Take 1 tablet (1 mg total) by mouth at bedtime as needed for sleep. Take immediately before  bedtime 30 tablet 1   HYDROcodone -acetaminophen  (NORCO) 5-325 MG tablet Take 1 tablet by mouth every 6 (six) hours as needed for moderate pain. (Patient not taking: Reported on 01/30/2024) 6 tablet 0   montelukast  (SINGULAIR ) 10 MG tablet Take 1 tablet (10 mg total) by mouth at bedtime. (Patient not taking: Reported on 01/30/2024) 30 tablet 11   MOUNJARO 5 MG/0.5ML Pen Inject 5 mg into the skin once a week. (Patient not taking: Reported on 01/30/2024)     No facility-administered medications prior to visit.     Review of Systems:   Constitutional: No weight loss or gain, night sweats, fevers, chills, lassitude. +fatigue,  HEENT: No headaches, difficulty swallowing, tooth/dental  problems, or sore throat. No sneezing, itching, ear ache, nasal congestion  CV:  No chest pain, orthopnea, PND, swelling in lower extremities, anasarca, dizziness, palpitations, syncope Resp: +shortness of breath with exertion. No excess mucus or change in color of mucus.  No hemoptysis. No wheezing.  No chest wall deformity GI:  No heartburn, indigestion GU: No dysuria, change in color of urine, urgency or frequency.   Skin: No rash, lesions, ulcerations MSK:  No increased joint pain or swelling.   Neuro: No dizziness or lightheadedness.  Psych: No depression or anxiety. Mood stable. +sleep disturbance, mania     Physical Exam:  BP 109/73 (BP Location: Left Arm, Patient Position: Sitting, Cuff Size: Large)   Pulse 78   Ht 5' 8 (1.727 m)   Wt 210 lb (95.3 kg)   SpO2 100%   BMI 31.93 kg/m   GEN: Pleasant, interactive, well-appearing; obese; in no acute distress. HEENT:  Normocephalic and atraumatic. PERRLA. Sclera white. Nasal turbinates pink, moist and patent bilaterally. No rhinorrhea present. Oropharynx pink and moist, without exudate or edema. No lesions, ulcerations, or postnasal drip.  NECK:  Supple w/ fair ROM. No lymphadenopathy.   CV: RRR, no m/r/g, no peripheral edema. Pulses intact, +2 bilaterally. No cyanosis, pallor or clubbing. PULMONARY:  Unlabored, regular breathing. Clear bilaterally A&P w/o wheezes/rales/rhonchi. No accessory muscle use.  GI: BS present and normoactive. Soft, non-tender to palpation. MSK: No erythema, warmth or tenderness. Cap refil <2 sec all extrem.  Neuro: A/Ox3. No focal deficits noted.   Skin: Warm, no lesions or rashe Psych: Normal affect and behavior. Judgement and thought content appropriate.     Lab Results:  CBC    Component Value Date/Time   WBC 10.6 (H) 12/30/2022 0438   RBC 3.79 (L) 12/30/2022 0438   HGB 11.0 (L) 12/30/2022 0438   HCT 34.4 (L) 12/30/2022 0438   PLT 281 12/30/2022 0438   MCV 90.8 12/30/2022 0438   MCH 29.0  12/30/2022 0438   MCHC 32.0 12/30/2022 0438   RDW 16.0 (H) 12/30/2022 0438   LYMPHSABS 1.7 12/29/2022 0554   MONOABS 1.3 (H) 12/29/2022 0554   EOSABS 0.0 12/29/2022 0554   BASOSABS 0.1 12/29/2022 0554    BMET    Component Value Date/Time   NA 133 (L) 12/30/2022 0438   K 4.5 12/30/2022 0438   CL 102 12/30/2022 0438   CO2 23 12/30/2022 0438   GLUCOSE 196 (H) 12/30/2022 0438   BUN 13 12/30/2022 0438   CREATININE 0.68 12/30/2022 0438   CALCIUM  9.9 06/22/2023 0000   GFRNONAA >60 12/30/2022 0438   GFRAA 76 12/15/2023 0733    BNP    Component Value Date/Time   BNP 13.0 12/28/2022 2019     Imaging:  No results found.  Administration History  None          Latest Ref Rng & Units 10/06/2022    2:23 PM  PFT Results  FVC-Pre L 2.63   FVC-Predicted Pre % 65   FVC-Post L 2.64   FVC-Predicted Post % 65   Pre FEV1/FVC % % 84   Post FEV1/FCV % % 88   FEV1-Pre L 2.20   FEV1-Predicted Pre % 68   FEV1-Post L 2.33   DLCO uncorrected ml/min/mmHg 17.40   DLCO UNC% % 72   DLCO corrected ml/min/mmHg 17.40   DLCO COR %Predicted % 72   DLVA Predicted % 116   TLC L 4.18   TLC % Predicted % 74   RV % Predicted % 76     No results found for: NITRICOXIDE      Assessment & Plan:   Severe persistent asthma Improved since prior OV. She was supposed to have HRCT and PFT to rule out RA ILD but these were not completed. Will follow up on scheduling. Suspect it is multifactorial related to asthma, deconditioning, pain related to RA, chronic fatigue. Stable on current regimen. Continue aggressive maintenance regimen. Action plan in place. Avoid triggers.   Patient Instructions  Continue Breztri  2 puffs Twice daily. Brush tongue and rinse mouth afterwards Continue Albuterol  inhaler 2 puffs or 3 mL every 6 hours as needed for shortness of breath or wheezing. Notify if symptoms persist despite rescue inhaler/neb use. Continue levocetirizine 5 mg daily for allergies - new  prescription sent to pharmacy  Continue Saline nasal rinses (Netti Pot or [employer]) 1-2 times a day. Use the flonase  about 20-30 min after Continue Flonase  nasal spray 1-2 sprays each nostril daily for nasal congestion/drainage  Continue Astelin  nasal spray 2 sprays each nostril Twice daily for sinus drainage, allergies   Asthma action plan: START nebs up to four times a day for worsening shortness of breath, wheezing and cough. If you symptoms do not improve in 24-48 hours, contact us  for steroid course. If your symptoms rapidly worsen, you have trouble talking or extreme difficulty breathing, or you're not getting relief from your nebulizer/rescue, go to the emergency department.   Given your progression of your rheumatoid and trouble with your breathing, I am recommending we repeat your lung function testing and obtain high resolution CT chest to ensure you have not developed progressive interstitial lung disease related to the RA.  Continue prednisone  and methotrexate  prescribed by rheumatology in interim   Increase use CPAP every night, minimum of 4-6 hours a night.  Change equipment as directed. Wash your tubing with warm soap and water daily, hang to dry. Wash humidifier portion weekly. Use bottled, distilled water and change daily Be aware of reduced alertness and do not drive or operate heavy machinery if experiencing this or drowsiness.  Exercise encouraged, as tolerated. Healthy weight management discussed.  Avoid or decrease alcohol consumption and medications that make you more sleepy, if possible. Notify if persistent daytime sleepiness occurs even with consistent use of PAP therapy.  Talk to your psychiatrist about the sleep issues and potential medications    Follow up in 6 months with Dr. Kara or Izetta Malachy PIETY. If symptoms do not improve or worsen, please contact office for sooner follow up or seek emergency care.   Mild obstructive sleep apnea Mild OSA on CPAP. She  has suboptimal compliance due to difficulties obtaining supplies and sleep disturbances related to bipolar disorder. Advised to follow up with DME on supplies order. Receives benefit  from use. Encouraged to increase usage. Minimal cardiovascular risks associated with mild OSA. Days with decreased usage seems to be primarily related to insomnia and/or RA symptoms. Advised to discuss sleep disturbances and pharmacological therapy options with psychiatrist. Aware of proper care/use. Safe driving practices reviewed.    Insomnia See above. Sleep hygiene reviewed  Moderate mixed bipolar I disorder (HCC) See above. Mood otherwise stable. Follow up with psychiatry as scheduled  Class 1 obesity with body mass index (BMI) of 31.0 to 31.9 in adult Encouraged continued healthy weight loss measures. BMI 31. Can reassess OSA once she is at her goal weight.     I spent 35 minutes of dedicated to the care of this patient on the date of this encounter to include pre-visit review of records, face-to-face time with the patient discussing conditions above, post visit ordering of testing, clinical documentation with the electronic health record, making appropriate referrals as documented, and communicating necessary findings to members of the patients care team.  Jordan LULLA Rouleau, NP 02/15/2024  Pt aware and understands NP's role.

## 2024-02-14 NOTE — Patient Instructions (Addendum)
 Continue Breztri  2 puffs Twice daily. Brush tongue and rinse mouth afterwards Continue Albuterol  inhaler 2 puffs or 3 mL every 6 hours as needed for shortness of breath or wheezing. Notify if symptoms persist despite rescue inhaler/neb use. Continue levocetirizine 5 mg daily for allergies - new prescription sent to pharmacy  Continue Saline nasal rinses (Netti Pot or [employer]) 1-2 times a day. Use the flonase  about 20-30 min after Continue Flonase  nasal spray 1-2 sprays each nostril daily for nasal congestion/drainage  Continue Astelin  nasal spray 2 sprays each nostril Twice daily for sinus drainage, allergies   Asthma action plan: START nebs up to four times a day for worsening shortness of breath, wheezing and cough. If you symptoms do not improve in 24-48 hours, contact us  for steroid course. If your symptoms rapidly worsen, you have trouble talking or extreme difficulty breathing, or you're not getting relief from your nebulizer/rescue, go to the emergency department.   Given your progression of your rheumatoid and trouble with your breathing, I am recommending we repeat your lung function testing and obtain high resolution CT chest to ensure you have not developed progressive interstitial lung disease related to the RA.  Continue prednisone  and methotrexate  prescribed by rheumatology in interim   Increase use CPAP every night, minimum of 4-6 hours a night.  Change equipment as directed. Wash your tubing with warm soap and water daily, hang to dry. Wash humidifier portion weekly. Use bottled, distilled water and change daily Be aware of reduced alertness and do not drive or operate heavy machinery if experiencing this or drowsiness.  Exercise encouraged, as tolerated. Healthy weight management discussed.  Avoid or decrease alcohol consumption and medications that make you more sleepy, if possible. Notify if persistent daytime sleepiness occurs even with consistent use of PAP  therapy.  Talk to your psychiatrist about the sleep issues and potential medications    Follow up in 6 months with Dr. Kara or Izetta Malachy PIETY. If symptoms do not improve or worsen, please contact office for sooner follow up or seek emergency care.

## 2024-02-15 ENCOUNTER — Encounter (HOSPITAL_BASED_OUTPATIENT_CLINIC_OR_DEPARTMENT_OTHER): Payer: Self-pay | Admitting: Nurse Practitioner

## 2024-02-15 DIAGNOSIS — Z6831 Body mass index (BMI) 31.0-31.9, adult: Secondary | ICD-10-CM | POA: Insufficient documentation

## 2024-02-15 NOTE — Assessment & Plan Note (Signed)
 Improved since prior OV. She was supposed to have HRCT and PFT to rule out RA ILD but these were not completed. Will follow up on scheduling. Suspect it is multifactorial related to asthma, deconditioning, pain related to RA, chronic fatigue. Stable on current regimen. Continue aggressive maintenance regimen. Action plan in place. Avoid triggers.   Patient Instructions  Continue Breztri  2 puffs Twice daily. Brush tongue and rinse mouth afterwards Continue Albuterol  inhaler 2 puffs or 3 mL every 6 hours as needed for shortness of breath or wheezing. Notify if symptoms persist despite rescue inhaler/neb use. Continue levocetirizine 5 mg daily for allergies - new prescription sent to pharmacy  Continue Saline nasal rinses (Netti Pot or [employer]) 1-2 times a day. Use the flonase  about 20-30 min after Continue Flonase  nasal spray 1-2 sprays each nostril daily for nasal congestion/drainage  Continue Astelin  nasal spray 2 sprays each nostril Twice daily for sinus drainage, allergies   Asthma action plan: START nebs up to four times a day for worsening shortness of breath, wheezing and cough. If you symptoms do not improve in 24-48 hours, contact us  for steroid course. If your symptoms rapidly worsen, you have trouble talking or extreme difficulty breathing, or you're not getting relief from your nebulizer/rescue, go to the emergency department.   Given your progression of your rheumatoid and trouble with your breathing, I am recommending we repeat your lung function testing and obtain high resolution CT chest to ensure you have not developed progressive interstitial lung disease related to the RA.  Continue prednisone  and methotrexate  prescribed by rheumatology in interim   Increase use CPAP every night, minimum of 4-6 hours a night.  Change equipment as directed. Wash your tubing with warm soap and water daily, hang to dry. Wash humidifier portion weekly. Use bottled, distilled water and change  daily Be aware of reduced alertness and do not drive or operate heavy machinery if experiencing this or drowsiness.  Exercise encouraged, as tolerated. Healthy weight management discussed.  Avoid or decrease alcohol consumption and medications that make you more sleepy, if possible. Notify if persistent daytime sleepiness occurs even with consistent use of PAP therapy.  Talk to your psychiatrist about the sleep issues and potential medications    Follow up in 6 months with Dr. Kara or Izetta Malachy PIETY. If symptoms do not improve or worsen, please contact office for sooner follow up or seek emergency care.

## 2024-02-15 NOTE — Assessment & Plan Note (Signed)
See above. Sleep hygiene reviewed.

## 2024-02-15 NOTE — Assessment & Plan Note (Signed)
 See above. Mood otherwise stable. Follow up with psychiatry as scheduled

## 2024-02-15 NOTE — Assessment & Plan Note (Addendum)
 Encouraged continued healthy weight loss measures. BMI 31. Can reassess OSA once she is at her goal weight.

## 2024-02-15 NOTE — Assessment & Plan Note (Signed)
 Mild OSA on CPAP. She has suboptimal compliance due to difficulties obtaining supplies and sleep disturbances related to bipolar disorder. Advised to follow up with DME on supplies order. Receives benefit from use. Encouraged to increase usage. Minimal cardiovascular risks associated with mild OSA. Days with decreased usage seems to be primarily related to insomnia and/or RA symptoms. Advised to discuss sleep disturbances and pharmacological therapy options with psychiatrist. Aware of proper care/use. Safe driving practices reviewed.

## 2024-02-28 ENCOUNTER — Ambulatory Visit: Admitting: Internal Medicine

## 2024-02-28 NOTE — Progress Notes (Deleted)
 Office Visit Note  Patient: [name]             Date of Birth: 29-Jun-1971           MRN: [id number]             PCP: Maree Isles, MD Referring: Maree Isles, MD Visit Date: 02/28/2024   Subjective:  No chief complaint on file.   History of Present Illness: [name] is a 53 y.o. female here for follow up ***   Previous HPI 01/30/24 Meeting today by telemedicine visit to follow-up on her rheumatoid arthritis and methotrexate .  After starting medication she experienced worsening symptoms with joint pain in multiple areas but particularly breaking out with erythematous and painful rash on the face and scalp.  She received a course of oral prednisone  from the emergency department for this which partially improved her symptoms.   She has been experiencing significant scalp pain and a burning sensation on her face for the past month. The scalp pain is severe, affecting her entire head, and is associated with hair loss. She has a history of scaly patches on her scalp previously managed with medicated shampoos, but currently, there are no visible lesions despite the pain.   The burning sensation on her face is described as similar to a chemical burn, although there is no visible redness. Cold applications provide some relief. She has been unable to perform her skincare routine for over two weeks due to these symptoms.   She has a history of tick bites, with one bite from two years ago causing localized alopecia and inflammation. The area of the bite becomes raised, sore, and itchy, leading to hair loss. A recent tick bite about a month ago was treated with prednisone  and doxycycline  after the area developed several rings and rashes and became very painful and itchy; the patient reports that the symptoms have improved since treatment.   She reports systemic symptoms including joint pain, particularly in her fingers, which are swollen and stiff in the morning but improve with  massage and temperature therapy. She experiences generalized body aches, headaches, and fatigue, which have been exacerbated since the tick bite. She also mentions difficulty sleeping due to pain and discomfort, impacting her ability to care for her grandson.   She is concerned about weight gain associated with prednisone  use, as she is trying to manage her weight and maintain her non-diabetic status. She is cautious about introducing new medications due to her extensive medication regimen.   12/28/23 [name] is a 53 year old female with rheumatoid arthritis and fibromyalgia who presents with worsening joint pain and swelling.   She has a long-standing history of joint pain initially attributed to fibromyalgia, affecting multiple joints including wrists, knees, hips, shoulders, and ankles. The pain is exacerbated by physical activity and weather changes, particularly cold and rainy conditions. She experiences difficulty with daily activities such as opening jars, tying shoes, and personal grooming due to weakness and swelling in her hands and wrists.   She has been on methotrexate  for almost two years, currently 25 mg PO weekly, but feels it is no longer effective in managing her symptoms. She also takes Cymbalta  60 mg daily, which she started about six months ago, and has previously been on prednisone , which she discontinued due to weight gain. She uses various topical treatments like lidocaine  patches and Voltaren gel to manage pain.   She has a history of joint injections in her knees  and back, which were helpful but are no longer covered by her insurance. She has not had recent imaging studies but recalls previous evaluations where her bloodwork and possibly ultrasounds were abnormal. She has not had fluid removed from her knees despite significant swelling.   Additional symptoms include anxiety, swelling of glands, and numbness and tingling in her hands and feet. She has a history of  falling, including a recent fall in a doctor's office, and uses a cane primarily in her left hand to assist with mobility. She has also been diagnosed with plantar fasciitis, which complicates her ability to wear supportive footwear. No specific joint injuries or surgeries.    11/2023 CBC wnl CMP unremarkable   05/2020 HBV neg HCV neg   10/2022 ESR 50 ANA neg RF neg CCP neg   05/2018 Vit D 11.3  No Rheumatology ROS completed.   PMFS History:  Patient Active Problem List   Diagnosis Date Noted   Class 1 obesity with body mass index (BMI) of 31.0 to 31.9 in adult 02/15/2024   Vitamin D  deficiency 12/28/2023   Bilateral knee pain 12/28/2023   Insomnia 09/01/2023   DOE (dyspnea on exertion) 09/01/2023   CAP (community acquired pneumonia) 12/29/2022   Essential hypertension 12/29/2022   Hypokalemia 12/29/2022   Pneumonia 12/29/2022   Mild obstructive sleep apnea 10/12/2022   Allergic rhinitis 10/12/2022   Respiratory syncytial virus (RSV) infection 05/25/2022   Gastroesophageal reflux disease 05/25/2022   Depression 05/25/2022   Rheumatoid arthritis (HCC) 05/25/2022   Impaired glucose tolerance 05/24/2022   Severe persistent asthma 05/23/2022   Hyperglycemia 05/23/2022   Mixed hyperlipidemia 05/23/2022   Uterine cramping 09/05/2016   Moderate mixed bipolar I disorder (HCC) 11/07/2013    Past Medical History:  Diagnosis Date   Anemia    Anxiety    Arthritis    orthopedic   Asthma    Bipolar disorder (HCC)    Depression    Fibromyalgia    GERD (gastroesophageal reflux disease)    Gout    Hypotension    IBS (irritable bowel syndrome)    Lyme disease 01/16/2024   Migraine    PTSD (post-traumatic stress disorder)    on prazosin    Sleep apnea    Tonsillar hypertrophy    Uterine fibroid     Family History  Problem Relation Age of Onset   Depression Mother    Hypertension Mother    Hyperlipidemia Mother    Other Mother        bone degeneration    Hypertension Father    Hyperlipidemia Father    Heart attack Father    Diabetes Father    COPD Father    Depression Brother    Alcohol abuse Brother    Alcohol abuse Brother    Bipolar disorder Daughter    Bipolar disorder Son    ADD / ADHD Son    Bipolar disorder Son    ADD / ADHD Son    Asperger's syndrome Son    Breast cancer Neg Hx    Past Surgical History:  Procedure Laterality Date   ENDOMETRIAL ABLATION     TONSILLECTOMY AND ADENOIDECTOMY N/A 02/13/2018   Procedure: TONSILLECTOMY AND ADENOIDECTOMY;  Surgeon: Karis Clunes, MD;  Location: [facility];  Service: ENT;  Laterality: N/A;   TUBAL LIGATION     Social History   Social History Narrative   Not on file   There is no immunization history for the selected administration types on file  for this patient.   Objective: Vital Signs: There were no vitals taken for this visit.   Physical Exam   Musculoskeletal Exam: ***  CDAI Exam: CDAI Score: -- Patient Global: --; Provider Global: -- Swollen: --; Tender: -- Joint Exam 02/28/2024   No joint exam has been documented for this visit   There is currently no information documented on the homunculus. Go to the Rheumatology activity and complete the homunculus joint exam.  Investigation: No additional findings.  Imaging: No results found.  Recent Labs: Lab Results  Component Value Date   WBC 10.6 (H) 12/30/2022   HGB 11.0 (L) 12/30/2022   PLT 281 12/30/2022   NA 133 (L) 12/30/2022   K 4.5 12/30/2022   CL 102 12/30/2022   CO2 23 12/30/2022   GLUCOSE 196 (H) 12/30/2022   BUN 13 12/30/2022   CREATININE 0.68 12/30/2022   BILITOT 0.2 (L) 12/29/2022   ALKPHOS 77 12/29/2022   AST 14 (L) 12/29/2022   ALT 16 12/29/2022   PROT 6.5 12/29/2022   ALBUMIN 3.1 (L) 12/29/2022   CALCIUM  9.9 06/22/2023   GFRAA 76 12/15/2023    Speciality Comments: No specialty comments available.  Procedures:  No procedures performed Allergies: Peanut-containing drug  products, Tegretol [carbamazepine], Beef allergy, Lithium, Pork-derived products, Sweet potato, and Penicillins   Assessment / Plan:     Visit Diagnoses: No diagnosis found.  ***  Orders: No orders of the defined types were placed in this encounter.  No orders of the defined types were placed in this encounter.    Follow-Up Instructions: No follow-ups on file.   Lonni LELON Ester, MD  Note - This record has been created using [employer].  Chart creation errors have been sought, but may not always  have been located. Such creation errors do not reflect on  the standard of medical care.

## 2024-03-06 ENCOUNTER — Telehealth: Payer: Self-pay

## 2024-03-06 NOTE — Telephone Encounter (Signed)
 Patient called the office stating she is in lots of pain in the hands, wrist, shoulders, elbows, neck and lower back. She has been dropping things. Patient has tried diclofenac sodium gel, and tried a tens machine but nothing is working. Please advise.

## 2024-03-08 DIAGNOSIS — E1165 Type 2 diabetes mellitus with hyperglycemia: Secondary | ICD-10-CM | POA: Diagnosis not present

## 2024-03-08 DIAGNOSIS — Z299 Encounter for prophylactic measures, unspecified: Secondary | ICD-10-CM | POA: Diagnosis not present

## 2024-03-08 DIAGNOSIS — M069 Rheumatoid arthritis, unspecified: Secondary | ICD-10-CM | POA: Diagnosis not present

## 2024-03-08 DIAGNOSIS — Z0000 Encounter for general adult medical examination without abnormal findings: Secondary | ICD-10-CM | POA: Diagnosis not present

## 2024-03-08 DIAGNOSIS — J4550 Severe persistent asthma, uncomplicated: Secondary | ICD-10-CM | POA: Diagnosis not present

## 2024-03-08 DIAGNOSIS — R52 Pain, unspecified: Secondary | ICD-10-CM | POA: Diagnosis not present

## 2024-03-10 ENCOUNTER — Other Ambulatory Visit: Payer: Self-pay | Admitting: Nurse Practitioner

## 2024-03-10 DIAGNOSIS — J4540 Moderate persistent asthma, uncomplicated: Secondary | ICD-10-CM

## 2024-03-10 DIAGNOSIS — K219 Gastro-esophageal reflux disease without esophagitis: Secondary | ICD-10-CM

## 2024-03-10 DIAGNOSIS — J309 Allergic rhinitis, unspecified: Secondary | ICD-10-CM

## 2024-03-16 DIAGNOSIS — G4733 Obstructive sleep apnea (adult) (pediatric): Secondary | ICD-10-CM | POA: Diagnosis not present

## 2024-03-21 ENCOUNTER — Telehealth: Payer: Self-pay

## 2024-03-21 DIAGNOSIS — G4733 Obstructive sleep apnea (adult) (pediatric): Secondary | ICD-10-CM

## 2024-03-21 NOTE — Telephone Encounter (Signed)
 Copied from CRM [id number]. Topic: Clinical - Order For Equipment >> Mar 21, 2024  2:22 PM Dustin F wrote: Reason for CRM: Pt is calling to place an order for DME through [employer] for CPAP supplies. Pt stated she received her CPAP machine and supplies back in Jan of 2025, but has not had any new supplies since. She stated she needs all the above including cushions, filters, and possibly a new mask as she had to hold hers every night.  Pt's phone number is [phone] ok to leave a vm. Pt has an appt at 4pm.  Spoke with patient today she came into the office . Order has been placed for CPAP supplies and patient has been notified . Nothing else further needed.

## 2024-03-21 NOTE — Telephone Encounter (Signed)
 Patient came into the office today with mother and stated she has not received any of her CPAP supplies . Patient is going to need a order sent into [employer] for patient to get her supplies she has been waiting for . Advised patient I will put a order in for patient to get her supplies . Patient's voice was understanding.Nothing else further needed.

## 2024-03-22 ENCOUNTER — Telehealth: Payer: Self-pay | Admitting: Nurse Practitioner

## 2024-03-22 NOTE — Telephone Encounter (Signed)
 Would you mind signing this order? Thanks!

## 2024-03-25 NOTE — Telephone Encounter (Signed)
 Okay done

## 2024-03-25 NOTE — Telephone Encounter (Signed)
 What order need signing ?

## 2024-03-25 NOTE — Telephone Encounter (Signed)
 Would you mind singing this order? Thanks

## 2024-03-25 NOTE — Telephone Encounter (Signed)
 It has been sent. NFN

## 2024-03-25 NOTE — Telephone Encounter (Signed)
 It is order #[id number] It is DME [facility] put in as urgent last week

## 2024-04-03 ENCOUNTER — Encounter: Payer: Self-pay | Admitting: Internal Medicine

## 2024-04-03 ENCOUNTER — Ambulatory Visit: Attending: Internal Medicine | Admitting: Internal Medicine

## 2024-04-03 VITALS — BP 117/76 | HR 75 | Temp 97.9°F | Resp 16 | Ht 68.0 in | Wt 211.0 lb

## 2024-04-03 DIAGNOSIS — Z79899 Other long term (current) drug therapy: Secondary | ICD-10-CM | POA: Insufficient documentation

## 2024-04-03 DIAGNOSIS — M0609 Rheumatoid arthritis without rheumatoid factor, multiple sites: Secondary | ICD-10-CM | POA: Diagnosis not present

## 2024-04-03 DIAGNOSIS — M7918 Myalgia, other site: Secondary | ICD-10-CM | POA: Diagnosis not present

## 2024-04-03 DIAGNOSIS — M1711 Unilateral primary osteoarthritis, right knee: Secondary | ICD-10-CM

## 2024-04-03 DIAGNOSIS — M170 Bilateral primary osteoarthritis of knee: Secondary | ICD-10-CM

## 2024-04-03 MED ORDER — LIDOCAINE HCL 1 % IJ SOLN
2.0000 mL | INTRAMUSCULAR | Status: AC | PRN
  Administered 2024-04-03: 2 mL

## 2024-04-03 MED ORDER — TIZANIDINE HCL 4 MG PO TABS: 4.0000 mg | ORAL_TABLET | Freq: Every evening | ORAL | 1 refills | Status: AC | PRN

## 2024-04-03 MED ORDER — METHOTREXATE SODIUM 2.5 MG PO TABS: 25.0000 mg | ORAL_TABLET | ORAL | 0 refills | Status: AC

## 2024-04-03 MED ORDER — FOLIC ACID 1 MG PO TABS: 1.0000 mg | ORAL_TABLET | Freq: Every day | ORAL | 3 refills | Status: AC

## 2024-04-03 MED ORDER — TRIAMCINOLONE ACETONIDE 40 MG/ML IJ SUSP
40.0000 mg | INTRAMUSCULAR | Status: AC | PRN
  Administered 2024-04-03: 40 mg via INTRA_ARTICULAR

## 2024-04-03 NOTE — Progress Notes (Signed)
 Office Visit Note  Patient: [name]             Date of Birth: [birth year]           MRN: [id number]             PCP: Maree Isles, MD Referring: Maree Isles, MD Visit Date: 04/03/2024   Subjective:  Medical Management of Chronic Issues (Lots of pain hard time sleeping from the aching. )   Discussed the use of AI scribe software for clinical note transcription with the patient, who gave verbal consent to proceed.  History of Present Illness   [name] is a 53 year old female with rheumatoid arthritis and osteoarthritis on methotrexate  25 mg PO weekly, folic acid  1mg  daily who presents with ongoing joint pain and stiffness. She also takes cymbalta  60 mg daily and lyrica  150 mg BID along with as needed tylenol , turmeric, and topical diclofenac.  She experiences persistent pain in her elbows, wrists, knees, and neck, occurring daily. She reports pain in her elbows, describing it as hurting around the 'funny bone' area worse on the right side. There is no numbness and no radiation of this pain. Her wrists and knees are also affected. Her right knee is the worse side.The pain is severe, causing tears and disrupting sleep, especially during weather changes.  She has a history of fibromyalgia and attributes some symptoms to this condition. She experiences insomnia and manic episodes, which she believes are related to seasonal changes. Besides medication she finds some relief with massage and cold packs although muscles are very sore when manipulated.  Her knee pain is particularly severe when descending stairs, described as a 'knife' stabbing sensation. She has previously received steroid injections in her knees, which provided relief for a few months, but her insurance no longer covers them. She also mentions having received injections in her lower back, which provided temporary relief.  Her social history includes having a grandson in the third grade who stays with her on  weekends. She is concerned about being susceptible to infections, especially with her grandson attending school.       Previous HPI 01/30/24 Meeting today by telemedicine visit to follow-up on her rheumatoid arthritis and methotrexate .  After starting medication she experienced worsening symptoms with joint pain in multiple areas but particularly breaking out with erythematous and painful rash on the face and scalp.  She received a course of oral prednisone  from the emergency department for this which partially improved her symptoms.   She has been experiencing significant scalp pain and a burning sensation on her face for the past month. The scalp pain is severe, affecting her entire head, and is associated with hair loss. She has a history of scaly patches on her scalp previously managed with medicated shampoos, but currently, there are no visible lesions despite the pain.   The burning sensation on her face is described as similar to a chemical burn, although there is no visible redness. Cold applications provide some relief. She has been unable to perform her skincare routine for over two weeks due to these symptoms.   She has a history of tick bites, with one bite from two years ago causing localized alopecia and inflammation. The area of the bite becomes raised, sore, and itchy, leading to hair loss. A recent tick bite about a month ago was treated with prednisone  and doxycycline  after the area developed several rings and rashes and became very painful and itchy; the  patient reports that the symptoms have improved since treatment.   She reports systemic symptoms including joint pain, particularly in her fingers, which are swollen and stiff in the morning but improve with massage and temperature therapy. She experiences generalized body aches, headaches, and fatigue, which have been exacerbated since the tick bite. She also mentions difficulty sleeping due to pain and discomfort, impacting her  ability to care for her grandson.   She is concerned about weight gain associated with prednisone  use, as she is trying to manage her weight and maintain her non-diabetic status. She is cautious about introducing new medications due to her extensive medication regimen.   12/28/23 [name] is a 53 year old female with rheumatoid arthritis and fibromyalgia who presents with worsening joint pain and swelling.   She has a long-standing history of joint pain initially attributed to fibromyalgia, affecting multiple joints including wrists, knees, hips, shoulders, and ankles. The pain is exacerbated by physical activity and weather changes, particularly cold and rainy conditions. She experiences difficulty with daily activities such as opening jars, tying shoes, and personal grooming due to weakness and swelling in her hands and wrists.   She has been on methotrexate  for almost two years, currently 25 mg PO weekly, but feels it is no longer effective in managing her symptoms. She also takes Cymbalta  60 mg daily, which she started about six months ago, and has previously been on prednisone , which she discontinued due to weight gain. She uses various topical treatments like lidocaine  patches and Voltaren gel to manage pain.   She has a history of joint injections in her knees and back, which were helpful but are no longer covered by her insurance. She has not had recent imaging studies but recalls previous evaluations where her bloodwork and possibly ultrasounds were abnormal. She has not had fluid removed from her knees despite significant swelling.   Additional symptoms include anxiety, swelling of glands, and numbness and tingling in her hands and feet. She has a history of falling, including a recent fall in a doctor's office, and uses a cane primarily in her left hand to assist with mobility. She has also been diagnosed with plantar fasciitis, which complicates her ability to wear supportive  footwear. No specific joint injuries or surgeries.    11/2023 CBC wnl CMP unremarkable   05/2020 HBV neg HCV neg   10/2022 ESR 50 ANA neg RF neg CCP neg   05/2018 Vit D 11.3  Review of Systems  Constitutional:  Positive for fatigue.  HENT:  Positive for mouth dryness. Negative for mouth sores.   Eyes:  Negative for dryness.  Respiratory:  Positive for shortness of breath.   Cardiovascular:  Positive for palpitations. Negative for chest pain.  Gastrointestinal:  Negative for blood in stool, constipation and diarrhea.  Endocrine: Positive for increased urination.  Genitourinary:  Negative for involuntary urination.  Musculoskeletal:  Positive for joint pain, gait problem, joint pain, joint swelling, myalgias, muscle weakness, morning stiffness, muscle tenderness and myalgias.  Skin:  Negative for color change, rash, hair loss and sensitivity to sunlight.  Allergic/Immunologic: Negative for susceptible to infections.  Neurological:  Positive for dizziness. Negative for headaches.  Hematological:  Negative for swollen glands.  Psychiatric/Behavioral:  Positive for depressed mood and sleep disturbance. The patient is nervous/anxious.     PMFS History:  Patient Active Problem List   Diagnosis Date Noted   High risk medication use 04/03/2024   Class 1 obesity with body mass index (BMI)  of 31.0 to 31.9 in adult 02/15/2024   Vitamin D  deficiency 12/28/2023   Bilateral primary osteoarthritis of knee 12/28/2023   Insomnia 09/01/2023   DOE (dyspnea on exertion) 09/01/2023   CAP (community acquired pneumonia) 12/29/2022   Essential hypertension 12/29/2022   Hypokalemia 12/29/2022   Pneumonia 12/29/2022   Mild obstructive sleep apnea 10/12/2022   Allergic rhinitis 10/12/2022   Respiratory syncytial virus (RSV) infection 05/25/2022   Gastroesophageal reflux disease 05/25/2022   Depression 05/25/2022   Rheumatoid arthritis (HCC) 05/25/2022   Impaired glucose tolerance 05/24/2022    Severe persistent asthma 05/23/2022   Hyperglycemia 05/23/2022   Mixed hyperlipidemia 05/23/2022   Uterine cramping 09/05/2016   Moderate mixed bipolar I disorder (HCC) 11/07/2013    Past Medical History:  Diagnosis Date   Anemia    Anxiety    Arthritis    orthopedic   Asthma    Bipolar disorder (HCC)    Depression    Fibromyalgia    GERD (gastroesophageal reflux disease)    Gout    Hypotension    IBS (irritable bowel syndrome)    Lyme disease 01/16/2024   Migraine    PTSD (post-traumatic stress disorder)    on prazosin    Sleep apnea    Tonsillar hypertrophy    Uterine fibroid     Family History  Problem Relation Age of Onset   Depression Mother    Hypertension Mother    Hyperlipidemia Mother    Other Mother        bone degeneration   Hypertension Father    Hyperlipidemia Father    Heart attack Father    Diabetes Father    COPD Father    Depression Brother    Alcohol abuse Brother    Alcohol abuse Brother    Bipolar disorder Daughter    Bipolar disorder Son    ADD / ADHD Son    Bipolar disorder Son    ADD / ADHD Son    Asperger's syndrome Son    Breast cancer Neg Hx    Past Surgical History:  Procedure Laterality Date   ENDOMETRIAL ABLATION     TONSILLECTOMY AND ADENOIDECTOMY N/A 02/13/2018   Procedure: TONSILLECTOMY AND ADENOIDECTOMY;  Surgeon: Karis Clunes, MD;  Location: [facility];  Service: ENT;  Laterality: N/A;   TUBAL LIGATION     Social History   Social History Narrative   Not on file   There is no immunization history for the selected administration types on file for this patient.   Objective: Vital Signs: BP 117/76   Pulse 75   Temp 97.9 F (36.6 C)   Resp 16   Ht 5' 8 (1.727 m)   Wt 211 lb (95.7 kg)   BMI 32.08 kg/m    Physical Exam Eyes:     Conjunctiva/sclera: Conjunctivae normal.  Cardiovascular:     Rate and Rhythm: Normal rate and regular rhythm.  Pulmonary:     Effort: Pulmonary effort is normal.      Breath sounds: Normal breath sounds.  Musculoskeletal:     Right lower leg: No edema.     Left lower leg: No edema.  Lymphadenopathy:     Cervical: No cervical adenopathy.  Skin:    General: Skin is warm and dry.     Findings: No rash.  Neurological:     Mental Status: She is alert.  Psychiatric:        Mood and Affect: Mood normal.      Musculoskeletal  Exam:  Widespread tenderness to pressure throughout upper and middle back, increased muscle tone, some pain radiation Shoulders full ROM no tenderness or swelling Elbows full ROM, right elbow tenderness on medial epicondyle Wrists full ROM no tenderness or swelling Fingers full ROM no tenderness or swelling Knees full ROM right knee joint line tenderness to pressure, no effusion Ankles full ROM no tenderness or swelling   Investigation: No additional findings.  Imaging: No results found.  Recent Labs: Lab Results  Component Value Date   WBC 10.6 (H) 12/30/2022   HGB 11.0 (L) 12/30/2022   PLT 281 12/30/2022   NA 133 (L) 12/30/2022   K 4.5 12/30/2022   CL 102 12/30/2022   CO2 23 12/30/2022   GLUCOSE 196 (H) 12/30/2022   BUN 13 12/30/2022   CREATININE 0.68 12/30/2022   BILITOT 0.2 (L) 12/29/2022   ALKPHOS 77 12/29/2022   AST 14 (L) 12/29/2022   ALT 16 12/29/2022   PROT 6.5 12/29/2022   ALBUMIN 3.1 (L) 12/29/2022   CALCIUM  9.9 06/22/2023   GFRAA 76 12/15/2023    Speciality Comments: No specialty comments available.  Procedures:  Large Joint Inj: R knee on 04/03/2024 9:10 AM Indications: pain Details: 27 G 1.5 in needle, anterolateral approach Medications: 2 mL lidocaine  1 %; 40 mg triamcinolone  acetonide 40 MG/ML Outcome: tolerated well, no immediate complications Procedure, treatment alternatives, risks and benefits explained, specific risks discussed. Consent was given by the patient. Immediately prior to procedure a time out was called to verify the correct patient, procedure, equipment, support staff and  site/side marked as required. Patient was prepped and draped in the usual sterile fashion.     Allergies: Peanut-containing drug products, Tegretol [carbamazepine], Beef allergy, Lithium, Pork-derived products, Sweet potato, and Penicillins   Assessment / Plan:     Visit Diagnoses: Rheumatoid arthritis of multiple sites with negative rheumatoid factor (HCC) - Plan: Sedimentation rate, methotrexate  (RHEUMATREX) 2.5 MG tablet RA well-controlled. No significant erosive damage or active systemic inflammation. Symptoms likely due to osteoarthritis and myofascial pain plus possibly continue low disease activity.  No specific indication for increasing to more aggressive immunosuppression. - Continue methotrexate  25 mg PO weekly and folic acid  1 mg daily. - Rechecking sedimentation rate for disease activity monitoring  High risk medication use - Plan: CBC with Differential/Platelet, Comprehensive metabolic panel with GFR Tolerating methotrexate  without specific complaint.  No serious interval infections. - Checking CBC CMP for medication monitoring on long-term use of methotrexate   Bilateral knee osteoarthritis Bilateral knee osteoarthritis with more symptoms in the left knee. Imaging shows joint space narrowing and bone spurring, especially in the left knee's medial compartment. Steroid injections beneficial for symptom relief. - Administer steroid injection in the rightnee.  Myofascial pain syndrome and fibromyalgia Chronic myofascial pain and fibromyalgia with widespread musculoskeletal pain. Current management includes Cymbalta  and Lyrica . Magnesium  oil suggested for elbow pain. - Add tizanidine  4 mg at bedtime PRN - Recommend trial of magnesium  oil for elbow pain. - Agree with ongoing current medications (Cymbalta  and Lyrica ). - Consider massage therapy and exercises as tolerated  Insomnia Insomnia likely related to chronic pain and fibromyalgia and may be worsening these.       Orders: Orders Placed This Encounter  Procedures   Sedimentation rate   CBC with Differential/Platelet   Comprehensive metabolic panel with GFR   Meds ordered this encounter  Medications   methotrexate  (RHEUMATREX) 2.5 MG tablet    Sig: Take 10 tablets (25 mg total) by mouth once a week.  Dispense:  130 tablet    Refill:  0   folic acid  (FOLVITE ) 1 MG tablet    Sig: Take 1 tablet (1 mg total) by mouth daily.    Dispense:  90 tablet    Refill:  3     Follow-Up Instructions: Return in about 3 months (around 07/03/2024) for RA/OA/FMS on MTX/inj f/u 3mos.   Lonni LELON Ester, MD  Note - This record has been created using [employer].  Chart creation errors have been sought, but may not always  have been located. Such creation errors do not reflect on  the standard of medical care.

## 2024-04-03 NOTE — Patient Instructions (Signed)
 Consider trying topical magnesium  oil for elbow pain and see if this helps.  I recommend checking out the [employer]  patient-centered guide for fibromyalgia and chronic pain management: https://howell-gardner.net/

## 2024-04-04 LAB — CBC WITH DIFFERENTIAL/PLATELET
Absolute Lymphocytes: 1817 {cells}/uL (ref 850–3900)
Absolute Monocytes: 468 {cells}/uL (ref 200–950)
Basophils Absolute: 31 {cells}/uL (ref 0–200)
Basophils Relative: 0.4 %
Eosinophils Absolute: 39 {cells}/uL (ref 15–500)
Eosinophils Relative: 0.5 %
HCT: 39.6 % (ref 35.0–45.0)
Hemoglobin: 12.9 g/dL (ref 11.7–15.5)
MCH: 29.9 pg (ref 27.0–33.0)
MCHC: 32.6 g/dL (ref 32.0–36.0)
MCV: 91.9 fL (ref 80.0–100.0)
MPV: 11.5 fL (ref 7.5–12.5)
Monocytes Relative: 6 %
Neutro Abs: 5444 {cells}/uL (ref 1500–7800)
Neutrophils Relative %: 69.8 %
Platelets: 228 Thousand/uL (ref 140–400)
RBC: 4.31 Million/uL (ref 3.80–5.10)
RDW: 15.3 % — ABNORMAL HIGH (ref 11.0–15.0)
Total Lymphocyte: 23.3 %
WBC: 7.8 Thousand/uL (ref 3.8–10.8)

## 2024-04-04 LAB — COMPREHENSIVE METABOLIC PANEL WITH GFR
AG Ratio: 1.5 (calc) (ref 1.0–2.5)
ALT: 7 U/L (ref 6–29)
AST: 9 U/L — ABNORMAL LOW (ref 10–35)
Albumin: 4.2 g/dL (ref 3.6–5.1)
Alkaline phosphatase (APISO): 86 U/L (ref 37–153)
BUN: 17 mg/dL (ref 7–25)
CO2: 25 mmol/L (ref 20–32)
Calcium: 10.2 mg/dL (ref 8.6–10.4)
Chloride: 106 mmol/L (ref 98–110)
Creat: 0.76 mg/dL (ref 0.50–1.03)
Globulin: 2.8 g/dL (ref 1.9–3.7)
Glucose, Bld: 76 mg/dL (ref 65–99)
Potassium: 4.1 mmol/L (ref 3.5–5.3)
Sodium: 137 mmol/L (ref 135–146)
Total Bilirubin: 0.3 mg/dL (ref 0.2–1.2)
Total Protein: 7 g/dL (ref 6.1–8.1)
eGFR: 94 mL/min/1.73m2 (ref >=60)

## 2024-04-04 LAB — SEDIMENTATION RATE: Sed Rate: 29 mm/h (ref 0–30)

## 2024-04-17 ENCOUNTER — Other Ambulatory Visit: Payer: Self-pay | Admitting: Nurse Practitioner

## 2024-04-17 DIAGNOSIS — J309 Allergic rhinitis, unspecified: Secondary | ICD-10-CM

## 2024-04-17 DIAGNOSIS — K219 Gastro-esophageal reflux disease without esophagitis: Secondary | ICD-10-CM

## 2024-04-17 DIAGNOSIS — J4540 Moderate persistent asthma, uncomplicated: Secondary | ICD-10-CM

## 2024-04-17 DIAGNOSIS — F5101 Primary insomnia: Secondary | ICD-10-CM

## 2024-04-19 DIAGNOSIS — M542 Cervicalgia: Secondary | ICD-10-CM | POA: Diagnosis not present

## 2024-04-19 DIAGNOSIS — M9901 Segmental and somatic dysfunction of cervical region: Secondary | ICD-10-CM | POA: Diagnosis not present

## 2024-04-19 DIAGNOSIS — M9903 Segmental and somatic dysfunction of lumbar region: Secondary | ICD-10-CM | POA: Diagnosis not present

## 2024-04-19 DIAGNOSIS — M546 Pain in thoracic spine: Secondary | ICD-10-CM | POA: Diagnosis not present

## 2024-04-19 DIAGNOSIS — M62830 Muscle spasm of back: Secondary | ICD-10-CM | POA: Diagnosis not present

## 2024-04-19 DIAGNOSIS — M9902 Segmental and somatic dysfunction of thoracic region: Secondary | ICD-10-CM | POA: Diagnosis not present

## 2024-04-19 MED ORDER — FAMOTIDINE 20 MG PO TABS: 20.0000 mg | ORAL_TABLET | Freq: Every day | ORAL | 11 refills | Status: AC

## 2024-04-19 MED ORDER — MONTELUKAST SODIUM 10 MG PO TABS: 10.0000 mg | ORAL_TABLET | Freq: Every day | ORAL | 11 refills | Status: AC

## 2024-04-25 DIAGNOSIS — G4733 Obstructive sleep apnea (adult) (pediatric): Secondary | ICD-10-CM | POA: Diagnosis not present

## 2024-04-26 DIAGNOSIS — M62830 Muscle spasm of back: Secondary | ICD-10-CM | POA: Diagnosis not present

## 2024-04-26 DIAGNOSIS — M9901 Segmental and somatic dysfunction of cervical region: Secondary | ICD-10-CM | POA: Diagnosis not present

## 2024-04-26 DIAGNOSIS — M546 Pain in thoracic spine: Secondary | ICD-10-CM | POA: Diagnosis not present

## 2024-04-26 DIAGNOSIS — M9902 Segmental and somatic dysfunction of thoracic region: Secondary | ICD-10-CM | POA: Diagnosis not present

## 2024-04-26 DIAGNOSIS — M9903 Segmental and somatic dysfunction of lumbar region: Secondary | ICD-10-CM | POA: Diagnosis not present

## 2024-04-26 DIAGNOSIS — M542 Cervicalgia: Secondary | ICD-10-CM | POA: Diagnosis not present

## 2024-04-30 ENCOUNTER — Emergency Department (HOSPITAL_COMMUNITY)

## 2024-04-30 ENCOUNTER — Encounter: Admitting: Nutrition

## 2024-04-30 ENCOUNTER — Telehealth: Payer: Self-pay | Admitting: Internal Medicine

## 2024-04-30 ENCOUNTER — Emergency Department (HOSPITAL_COMMUNITY)
Admission: EM | Admit: 2024-04-30 | Discharge: 2024-04-30 | Disposition: A | Discharge status: Home / Self Care | Source: Ambulatory Visit | Attending: Emergency Medicine | Admitting: Emergency Medicine

## 2024-04-30 ENCOUNTER — Other Ambulatory Visit: Payer: Self-pay

## 2024-04-30 ENCOUNTER — Encounter (HOSPITAL_COMMUNITY): Payer: Self-pay

## 2024-04-30 ENCOUNTER — Ambulatory Visit: Payer: Self-pay

## 2024-04-30 DIAGNOSIS — R0602 Shortness of breath: Secondary | ICD-10-CM | POA: Insufficient documentation

## 2024-04-30 DIAGNOSIS — Z7951 Long term (current) use of inhaled steroids: Secondary | ICD-10-CM | POA: Diagnosis not present

## 2024-04-30 DIAGNOSIS — J449 Chronic obstructive pulmonary disease, unspecified: Secondary | ICD-10-CM | POA: Insufficient documentation

## 2024-04-30 DIAGNOSIS — E042 Nontoxic multinodular goiter: Secondary | ICD-10-CM | POA: Diagnosis not present

## 2024-04-30 DIAGNOSIS — Z794 Long term (current) use of insulin: Secondary | ICD-10-CM | POA: Insufficient documentation

## 2024-04-30 DIAGNOSIS — Z91010 Allergy to peanuts: Secondary | ICD-10-CM | POA: Insufficient documentation

## 2024-04-30 DIAGNOSIS — Z79899 Other long term (current) drug therapy: Secondary | ICD-10-CM | POA: Insufficient documentation

## 2024-04-30 DIAGNOSIS — M1909 Primary osteoarthritis, other specified site: Secondary | ICD-10-CM | POA: Diagnosis not present

## 2024-04-30 LAB — CBC
HCT: 39.8 % (ref 36.0–46.0)
Hemoglobin: 13 g/dL (ref 12.0–15.0)
MCH: 29.9 pg (ref 26.0–34.0)
MCHC: 32.7 g/dL (ref 30.0–36.0)
MCV: 91.5 fL (ref 80.0–100.0)
Platelets: 191 K/uL (ref 150–400)
RBC: 4.35 MIL/uL (ref 3.87–5.11)
RDW: 15.2 % (ref 11.5–15.5)
WBC: 5.7 K/uL (ref 4.0–10.5)
nRBC: 0 % (ref 0.0–0.2)

## 2024-04-30 LAB — TSH: TSH: 0.909 u[IU]/mL (ref 0.350–4.500)

## 2024-04-30 LAB — BASIC METABOLIC PANEL WITH GFR
Anion gap: 10 (ref 5–15)
BUN: 12 mg/dL (ref 6–20)
CO2: 24 mmol/L (ref 22–32)
Calcium: 10.6 mg/dL — ABNORMAL HIGH (ref 8.9–10.3)
Chloride: 108 mmol/L (ref 98–111)
Creatinine, Ser: 0.96 mg/dL (ref 0.44–1.00)
GFR, Estimated: >60 mL/min (ref >60)
Glucose, Bld: 84 mg/dL (ref 70–99)
Potassium: 3.5 mmol/L (ref 3.5–5.1)
Sodium: 141 mmol/L (ref 135–145)

## 2024-04-30 MED ORDER — GUAIFENESIN ER 600 MG PO TB12: 600.0000 mg | ORAL_TABLET | Freq: Two times a day (BID) | ORAL | 0 refills | Status: AC | PRN

## 2024-04-30 MED ORDER — SODIUM CHLORIDE 0.9 % IV BOLUS
500.0000 mL | Freq: Once | INTRAVENOUS | Status: AC
  Administered 2024-04-30: 500 mL via INTRAVENOUS

## 2024-04-30 NOTE — Telephone Encounter (Signed)
 Patient called wanting Dr. Jeannetta to order lab work to see if she has Sjogren syndrome. Pt states she has the same symptoms as Sjogren and would like to rule it out if possible.

## 2024-04-30 NOTE — ED Triage Notes (Signed)
 Pt to er, pt states that she has been short of breath for the past couple of days, states that she also has lots of fatigue and tremors.

## 2024-04-30 NOTE — Telephone Encounter (Signed)
 FYI Only or Action Required?: FYI only for provider.  Patient is followed in Pulmonology for Asthma, last seen on 02/14/2024 by Malachy Comer GAILS, NP.  Called Nurse Triage reporting Shortness of Breath.  Symptoms began several days ago.  Interventions attempted: Maintenance inhaler.  Symptoms are: gradually worsening.  Triage Disposition: Go to ED Now (Notify PCP)  Patient/caregiver understands and will follow disposition?: Yes  **Referred to ED**    Copied from CRM [id number]. Topic: Clinical - Red Word Triage >> Apr 30, 2024  3:03 PM Corean SAUNDERS wrote: Red Word that prompted transfer to Nurse Triage: Trouble breathing and extreme fatigue. Reason for Disposition  [1] MODERATE difficulty breathing (e.g., speaks in phrases, SOB even at rest, pulse 100-120) AND [2] NEW-onset or WORSE than normal  Answer Assessment - Initial Assessment Questions 1. RESPIRATORY STATUS: Describe your breathing? (e.g., wheezing, shortness of breath, unable to speak, severe coughing)      SOB on exertion, using inhaler more often, during call patient speaking in phrases, having a hard time catching her breath.    2. ONSET: When did this breathing problem begin?      X 4 days   3. PATTERN Does the difficult breathing come and go, or has it been constant since it started?      Intermittent   4. SEVERITY: How bad is your breathing? (e.g., mild, moderate, severe)       Moderate   5. RECURRENT SYMPTOM: Have you had difficulty breathing before? If Yes, ask: When was the last time? and What happened that time?      Yes, ongoing problem   6. CARDIAC HISTORY: Do you have any history of heart disease? (e.g., heart attack, angina, bypass surgery, angioplasty)      No   7. LUNG HISTORY: Do you have any history of lung disease?  (e.g., pulmonary embolus, asthma, emphysema)     asthma  8. CAUSE: What do you think is causing the breathing problem?      Unsure   9. OTHER SYMPTOMS: Do you  have any other symptoms? (e.g., chest pain, cough, dizziness, fever, runny nose)     Fatigue x 3 days, dizziness with standing, mild    10. O2 SATURATION MONITOR:  Do you use an oxygen saturation monitor (pulse oximeter) at home? If Yes, ask: What is your reading (oxygen level) today? What is your usual oxygen saturation reading? (e.g., 95%)  No   Referred to ED for symptoms, she will have her son take her now, and will follow up post hospitalization.  Protocols used: Breathing Difficulty-A-AH

## 2024-04-30 NOTE — Discharge Instructions (Signed)
 Follow-up with your pulmonologist in the next couple weeks

## 2024-05-03 NOTE — ED Provider Notes (Signed)
 [place] EMERGENCY DEPARTMENT AT [facility] Provider Note   CSN: [id number] Arrival date & time: 04/30/24  1650     Patient presents with: Shortness of Breath   [name] is a 53 y.o. female.   Patient complains of shortness of breath and feeling something stuck in her throat.  Patient has a history of COPD  The history is provided by the patient and medical records. No language interpreter was used.  Shortness of Breath Severity:  Mild Onset quality:  Sudden Timing:  Intermittent Progression:  Waxing and waning Chronicity:  New Context: not activity   Relieved by:  Nothing Ineffective treatments:  None tried Associated symptoms: no abdominal pain, no chest pain, no cough, no headaches and no rash        Prior to Admission medications   Medication Sig Start Date End Date Taking? Authorizing Provider  guaiFENesin (MUCINEX) 600 MG 12 hr tablet Take 1 tablet (600 mg total) by mouth 2 (two) times daily as needed for to loosen phlegm. 04/30/24  Yes Srikar Chiang, MD  acetaminophen  (TYLENOL ) 325 MG tablet Take 2 tablets (650 mg total) by mouth every 6 (six) hours as needed for mild pain (or Fever >/= 101). 12/30/22   Pearlean Manus, MD  albuterol  (PROVENTIL ) (2.5 MG/3ML) 0.083% nebulizer solution Take 3 mLs (2.5 mg total) by nebulization every 4 (four) hours as needed for wheezing or shortness of breath. 02/14/24 02/13/25  Cobb, Comer GAILS, NP  albuterol  (VENTOLIN  HFA) 108 (90 Base) MCG/ACT inhaler Inhale 2 puffs into the lungs every 4 (four) hours as needed for wheezing or shortness of breath. 12/30/22   Pearlean Manus, MD  allopurinol  (ZYLOPRIM ) 300 MG tablet Take 300 mg by mouth daily. 04/21/24   [provider]  ALPRAZolam  (XANAX ) 0.5 MG tablet Take 0.5 mg by mouth 2 (two) times daily as needed for anxiety. 06/29/19   [provider]  atorvastatin  (LIPITOR) 40 MG tablet Take 40 mg by mouth daily. Patient not taking: Reported on  04/03/2024    [provider]  azelastine  (ASTELIN ) 0.1 % nasal spray Place 2 sprays into both nostrils 2 (two) times daily. Use in each nostril as directed Patient not taking: Reported on 04/03/2024 06/07/23   Malachy Comer GAILS, NP  azithromycin  (ZITHROMAX ) 250 MG tablet Azithromycin  500mg  x 1 daily and then next 4 days is 250mg  daily x 4 days Patient not taking: Reported on 04/03/2024 08/19/23   Geronimo Amel, MD  BELSOMRA 20 MG TABS Take 1 tablet by mouth at bedtime as needed. 03/22/24   [provider]  budeson-glycopyrrolate-formoterol  (BREZTRI  AEROSPHERE) 160-9-4.8 MCG/ACT AERO inhaler Inhale 2 puffs into the lungs in the morning and at bedtime. 11/27/23   Cobb, Comer GAILS, NP  cholecalciferol (VITAMIN D3) 25 MCG (1000 UNIT) tablet Take 4,000 Units by mouth daily. Patient not taking: Reported on 04/03/2024    [provider]  colchicine  0.6 MG tablet Take 0.6 mg by mouth daily. 05/11/22   [provider]  diclofenac Sodium (VOLTAREN) 1 % GEL Apply 1 Application topically 4 (four) times daily. 10/10/22   [provider]  DULoxetine  (CYMBALTA ) 60 MG capsule Take 60 mg by mouth daily. 06/29/19   [provider]  famotidine  (PEPCID ) 20 MG tablet Take 1 tablet (20 mg total) by mouth daily. 04/19/24   Cobb, Comer GAILS, NP  folic acid  (FOLVITE ) 1 MG tablet Take 1 tablet (1 mg total) by mouth daily. 04/03/24   Rice, Lonni ORN, MD  furosemide  (  LASIX ) 20 MG tablet Take 1 tablet (20 mg total) by mouth daily. 07/15/22   Gladis Leonor HERO, MD  hydrocortisone  2.5 % cream Apply topically 2 (two) times daily as needed. 01/30/24   Rice, Lonni ORN, MD  hydrOXYzine  (ATARAX ) 25 MG tablet Take 25 mg by mouth 2 (two) times daily. 04/21/24   [provider]  ipratropium-albuterol  (DUONEB) 0.5-2.5 (3) MG/3ML SOLN Take 3 mLs by nebulization every 6 (six) hours as needed. Patient not taking: Reported on 04/03/2024 12/30/22   Pearlean Manus, MD  LATUDA  40  MG TABS tablet Take 40 mg by mouth at bedtime. Patient not taking: Reported on 04/03/2024 06/29/19   [provider]  levocetirizine (XYZAL ) 5 MG tablet take 1 tablet (5 MILLIGRAM total) by mouth daily. 05/15/23   Kara Dorn NOVAK, MD  Lurasidone  HCl 60 MG TABS Take 1 tablet by mouth daily. 12/20/23   [provider]  metFORMIN  (GLUCOPHAGE ) 500 MG tablet Take 1 tablet (500 mg total) by mouth daily with breakfast. Patient not taking: Reported on 04/03/2024 12/30/22   Pearlean Manus, MD  methotrexate  (RHEUMATREX) 2.5 MG tablet Take 10 tablets (25 mg total) by mouth once a week. 04/03/24   Jeannetta Lonni ORN, MD  mirabegron  ER (MYRBETRIQ ) 25 MG TB24 tablet Take 25 mg by mouth daily.    [provider]  montelukast  (SINGULAIR ) 10 MG tablet Take 1 tablet (10 mg total) by mouth at bedtime. 04/19/24   Cobb, Comer GAILS, NP  MOUNJARO 7.5 MG/0.5ML Pen  12/06/23   [provider]  oxcarbazepine  (TRILEPTAL ) 600 MG tablet Take 600 mg by mouth daily. 04/21/24   [provider]  pantoprazole  (PROTONIX ) 40 MG tablet Take 1 tablet (40 mg total) by mouth daily. 12/30/22 04/03/24  Pearlean Manus, MD  prazosin  (MINIPRESS ) 5 MG capsule Take 5 mg by mouth 3 (three) times daily. @ Bedtime    [provider]  pregabalin  (LYRICA ) 150 MG capsule Take 150 mg by mouth 2 (two) times daily. 12/01/23   [provider]  promethazine (PHENERGAN) 25 MG tablet Take 25 mg by mouth every 6 (six) hours as needed. Patient not taking: Reported on 04/03/2024 12/09/22   [provider]  tiZANidine  (ZANAFLEX ) 4 MG tablet Take 1 tablet (4 mg total) by mouth at bedtime as needed for muscle spasms. 04/03/24   Rice, Lonni ORN, MD  Turmeric (QC TUMERIC COMPLEX) 500 MG CAPS Take by mouth.    [provider]  UNABLE TO FIND daily. Med Name: Vitaglobe-Mushroom gummie Patient not taking: Reported on 04/03/2024    [provider]  vitamin B-12 (CYANOCOBALAMIN ) 100  MCG tablet Take 100 mcg by mouth daily.    [provider]  Vitamin D -Vitamin K (D3 + K2 PO) Take by mouth.    [provider]    Allergies: Peanut-containing drug products, Tegretol [carbamazepine], Beef allergy, Porcine (pork) protein-containing drug products, Lithium, Penicillins, and Sweet potato    Review of Systems  Constitutional:  Negative for appetite change and fatigue.  HENT:  Negative for congestion, ear discharge and sinus pressure.   Eyes:  Negative for discharge.  Respiratory:  Positive for shortness of breath. Negative for cough.   Cardiovascular:  Negative for chest pain.  Gastrointestinal:  Negative for abdominal pain and diarrhea.  Genitourinary:  Negative for frequency and hematuria.  Musculoskeletal:  Negative for back pain.  Skin:  Negative for rash.  Neurological:  Negative for seizures and headaches.  Psychiatric/Behavioral:  Negative for hallucinations.  Updated Vital Signs BP 123/74   Pulse 73   Temp 98 F (36.7 C)   Resp 17   Ht 5' 8 (1.727 m)   Wt 93 kg   SpO2 100%   BMI 31.17 kg/m   Physical Exam Vitals and nursing note reviewed.  Constitutional:      Appearance: She is well-developed.  HENT:     Head: Normocephalic.     Nose: Nose normal.  Eyes:     General: No scleral icterus.    Conjunctiva/sclera: Conjunctivae normal.  Neck:     Thyroid : No thyromegaly.  Cardiovascular:     Rate and Rhythm: Normal rate and regular rhythm.     Heart sounds: No murmur heard.    No friction rub. No gallop.  Pulmonary:     Breath sounds: No stridor. No wheezing or rales.  Chest:     Chest wall: No tenderness.  Abdominal:     General: There is no distension.     Tenderness: There is no abdominal tenderness. There is no rebound.  Musculoskeletal:        General: Normal range of motion.     Cervical back: Neck supple.  Lymphadenopathy:     Cervical: No cervical adenopathy.  Skin:    Findings: No erythema or rash.   Neurological:     Mental Status: She is alert and oriented to person, place, and time.     Motor: No abnormal muscle tone.     Coordination: Coordination normal.  Psychiatric:        Behavior: Behavior normal.     (all labs ordered are listed, but only abnormal results are displayed) Labs Reviewed  BASIC METABOLIC PANEL WITH GFR - Abnormal; Notable for the following components:      Result Value   Calcium  10.6 (*)    All other components within normal limits  CBC  TSH    EKG: EKG Interpretation Date/Time:  Tuesday April 30 2024 17:04:52 EDT Ventricular Rate:  85 PR Interval:  168 QRS Duration:  78 QT Interval:  334 QTC Calculation: 397 R Axis:   16  Text Interpretation: Normal sinus rhythm Cannot rule out Anterior infarct , age undetermined ST & T wave abnormality, consider inferior ischemia Abnormal ECG When compared with ECG of 29-Dec-2022 00:35, PREVIOUS ECG IS PRESENT Confirmed by Suzette Pac [phone]) on 04/30/2024 10:11:37 PM  Radiology: No results found.   Procedures   Medications Ordered in the ED  sodium chloride  0.9 % bolus 500 mL (0 mLs Intravenous Stopped 04/30/24 2142)                                    Medical Decision Making Amount and/or Complexity of Data Reviewed Labs: ordered. Radiology: ordered.  Risk OTC drugs.   Patient with normal CT of the neck and chest x-ray.  She is placed on Mucinex and will follow-up with her pulmonology     Final diagnoses:  Shortness of breath    ED Discharge Orders          Ordered    guaiFENesin (MUCINEX) 600 MG 12 hr tablet  2 times daily PRN        04/30/24 2212               Milo Schreier, MD 05/03/24 1449

## 2024-05-16 ENCOUNTER — Ambulatory Visit: Admitting: Nurse Practitioner

## 2024-05-16 ENCOUNTER — Encounter: Payer: Self-pay | Admitting: Nutrition

## 2024-05-16 ENCOUNTER — Encounter: Attending: Family Medicine | Admitting: Nutrition

## 2024-05-16 VITALS — Ht 68.0 in | Wt 203.0 lb

## 2024-05-16 DIAGNOSIS — E11649 Type 2 diabetes mellitus with hypoglycemia without coma: Secondary | ICD-10-CM | POA: Insufficient documentation

## 2024-05-16 DIAGNOSIS — E6609 Other obesity due to excess calories: Secondary | ICD-10-CM | POA: Insufficient documentation

## 2024-05-16 DIAGNOSIS — G4733 Obstructive sleep apnea (adult) (pediatric): Secondary | ICD-10-CM | POA: Diagnosis not present

## 2024-05-16 NOTE — Progress Notes (Signed)
 Medical Nutrition Therapy  Appointment Start time:  1455  Appointment End time:  1530  Primary concerns today: Dm Type 2, Obesity,   Referral diagnosis: E11.8, E66.9 Preferred learning style: No preference. Learning readiness: Changes in progress    NUTRITION ASSESSMENT Follow up Lost 13 lbs since last visit 5 months ago. Has been work on meal planning and meal prepping. Weighing just a week now. Gardening and eating foods from garden. Working on increasing exercise. No recent A1C . Last was 5.5%. No longer prediabetic or diabetic.   >> A1C reported to be 5.5% per patient., down from 6.3%. Anthropometrics    Wt Readings from Last 3 Encounters:  04/30/24 205 lb (93 kg)  04/03/24 211 lb (95.7 kg)  02/14/24 210 lb (95.3 kg)   Ht Readings from Last 3 Encounters:  04/30/24 5' 8 (1.727 m)  04/03/24 5' 8 (1.727 m)  02/14/24 5' 8 (1.727 m)   There is no height or weight on file to calculate BMI. @BMIFA @ Facility age limit for growth %iles is 20 years. Facility age limit for growth %iles is 20 years.   Clinical Lab Results  Component Value Date   HGBA1C 6.3 (H) 12/30/2022      Latest Ref Rng & Units 04/30/2024    6:02 PM 04/03/2024    9:17 AM 06/22/2023   12:00 AM  CMP  Glucose 70 - 99 mg/dL 84  76    BUN 6 - 20 mg/dL 12  17    Creatinine 9.55 - 1.00 mg/dL 9.03  9.23    Sodium 864 - 145 mmol/L 141  137    Potassium 3.5 - 5.1 mmol/L 3.5  4.1    Chloride 98 - 111 mmol/L 108  106    CO2 22 - 32 mmol/L 24  25    Calcium  8.9 - 10.3 mg/dL 89.3  89.7  9.9      Total Protein 6.1 - 8.1 g/dL  7.0    Total Bilirubin 0.2 - 1.2 mg/dL  0.3    AST 10 - 35 U/L  9    ALT 6 - 29 U/L  7       This result is from an external source.   Medical Hx: Bipolar disease, high cholesterol, incontinence, anxiety, depression, OCD Medications: See Chart Labs: Last A1c 6 ,  Notable Signs/Symptoms: see cahrt  Lifestyle & Dietary Hx   Estimated daily fluid intake: 40-64 oz Supplements:  Folic acid , Vit B 12, MVI, Vit E Sleep: 6 Stress / self-care: Financial  Current average weekly physical activity: Has beem walking some.  24-Hr Dietary Recall Noon pasta with broccoli and tea and tumeric teat with honey 1015 pm; 6 sub turkey, lettuce, chceese, o/l, fry an water   Estimated Energy Needs Calories: 1200 Carbohydrate: 135g Protein: 90g Fat: 33g   NUTRITION DIAGNOSIS  NB-1.1 Food and nutrition-related knowledge deficit As related to Obesity.  As evidenced by BMI 37 .   NUTRITION INTERVENTION  Nutrition education (E-1) on the following topics:  Lifestyle Medicine  - Whole Food, Plant Predominant Nutrition is highly recommended: Eat Plenty of vegetables, Mushrooms, fruits, Legumes, Whole Grains, Nuts, seeds in lieu of processed meats, processed snacks/pastries red meat, poultry, eggs.    -It is better to avoid simple carbohydrates including: Cakes, Sweet Desserts, Ice Cream, Soda (diet and regular), Sweet Tea, Candies, Chips, Cookies, Store Bought Juices, Alcohol in Excess of  1-2 drinks a day, Lemonade,  Artificial Sweeteners, Doughnuts, Coffee Creamers, Sugar-free Products, etc, etc.  This is not a complete list.....  Exercise: If you are able: 30 -60 minutes a day ,4 days a week, or 150 minutes a week.  The longer the better.  Combine stretch, strength, and aerobic activities.  If you were told in the past that you have high risk for cardiovascular diseases, you may seek evaluation by your heart doctor prior to initiating moderate to intense exercise programs.   Handouts Provided Include  Lifestyle Medicine Meal Plan Card  Learning Style & Readiness for Change Teaching method utilized: Visual & Auditory  Demonstrated degree of understanding via: Teach Back  Barriers to learning/adherence to lifestyle change: none  Goals Established by Pt Goals  Do the chair exercises and walking videos 3 times per week and add 1 day per week Get in 150 minutes of exercise  per week. Continue gardening Keep up the great job!   MONITORING & EVALUATION Dietary intake, weekly physical activity, and weight in 6 months.  Next Steps  Patient is to work on meal planning and exercise.SABRA

## 2024-05-16 NOTE — Patient Instructions (Addendum)
 Goals  Do the chair exercises and walking videos 3 times per week and add 1 day per week Get in 150 minutes of exercise per week. Continue gardening Keep up the great job!

## 2024-05-18 ENCOUNTER — Other Ambulatory Visit: Payer: Self-pay | Admitting: Pulmonary Disease

## 2024-05-18 DIAGNOSIS — J302 Other seasonal allergic rhinitis: Secondary | ICD-10-CM

## 2024-06-12 ENCOUNTER — Ambulatory Visit (HOSPITAL_COMMUNITY): Admission: RE | Admit: 2024-06-12 | Source: Ambulatory Visit

## 2024-06-18 ENCOUNTER — Ambulatory Visit: Payer: 59 | Admitting: Nurse Practitioner

## 2024-06-24 NOTE — Progress Notes (Deleted)
 Office Visit Note  Patient: [name]             Date of Birth: [birth year]           MRN: [id number]             PCP: Maree Isles, MD Referring: Maree Isles, MD Visit Date: 07/03/2024   Subjective:  No chief complaint on file.   History of Present Illness: [name] is a 53 y.o. female here for follow up with rheumatoid arthritis and osteoarthritis on methotrexate  25 mg PO weekly, folic acid  1mg  daily who presents with ongoing joint pain and stiffness.   Previous HPI 04/03/2024 [name] is a 53 year old female with rheumatoid arthritis and osteoarthritis on methotrexate  25 mg PO weekly, folic acid  1mg  daily who presents with ongoing joint pain and stiffness. She also takes cymbalta  60 mg daily and lyrica  150 mg BID along with as needed tylenol , turmeric, and topical diclofenac.   She experiences persistent pain in her elbows, wrists, knees, and neck, occurring daily. She reports pain in her elbows, describing it as hurting around the 'funny bone' area worse on the right side. There is no numbness and no radiation of this pain. Her wrists and knees are also affected. Her right knee is the worse side.The pain is severe, causing tears and disrupting sleep, especially during weather changes.   She has a history of fibromyalgia and attributes some symptoms to this condition. She experiences insomnia and manic episodes, which she believes are related to seasonal changes. Besides medication she finds some relief with massage and cold packs although muscles are very sore when manipulated.   Her knee pain is particularly severe when descending stairs, described as a 'knife' stabbing sensation. She has previously received steroid injections in her knees, which provided relief for a few months, but her insurance no longer covers them. She also mentions having received injections in her lower back, which provided temporary relief.   Her social history includes having  a grandson in the third grade who stays with her on weekends. She is concerned about being susceptible to infections, especially with her grandson attending school.         Previous HPI 01/30/24 Meeting today by telemedicine visit to follow-up on her rheumatoid arthritis and methotrexate .  After starting medication she experienced worsening symptoms with joint pain in multiple areas but particularly breaking out with erythematous and painful rash on the face and scalp.  She received a course of oral prednisone  from the emergency department for this which partially improved her symptoms.   She has been experiencing significant scalp pain and a burning sensation on her face for the past month. The scalp pain is severe, affecting her entire head, and is associated with hair loss. She has a history of scaly patches on her scalp previously managed with medicated shampoos, but currently, there are no visible lesions despite the pain.   The burning sensation on her face is described as similar to a chemical burn, although there is no visible redness. Cold applications provide some relief. She has been unable to perform her skincare routine for over two weeks due to these symptoms.   She has a history of tick bites, with one bite from two years ago causing localized alopecia and inflammation. The area of the bite becomes raised, sore, and itchy, leading to hair loss. A recent tick bite about a month ago was treated with prednisone  and doxycycline  after the  area developed several rings and rashes and became very painful and itchy; the patient reports that the symptoms have improved since treatment.   She reports systemic symptoms including joint pain, particularly in her fingers, which are swollen and stiff in the morning but improve with massage and temperature therapy. She experiences generalized body aches, headaches, and fatigue, which have been exacerbated since the tick bite. She also mentions difficulty  sleeping due to pain and discomfort, impacting her ability to care for her grandson.   She is concerned about weight gain associated with prednisone  use, as she is trying to manage her weight and maintain her non-diabetic status. She is cautious about introducing new medications due to her extensive medication regimen.    12/28/23 [name] is a 53 year old female with rheumatoid arthritis and fibromyalgia who presents with worsening joint pain and swelling.   She has a long-standing history of joint pain initially attributed to fibromyalgia, affecting multiple joints including wrists, knees, hips, shoulders, and ankles. The pain is exacerbated by physical activity and weather changes, particularly cold and rainy conditions. She experiences difficulty with daily activities such as opening jars, tying shoes, and personal grooming due to weakness and swelling in her hands and wrists.   She has been on methotrexate  for almost two years, currently 25 mg PO weekly, but feels it is no longer effective in managing her symptoms. She also takes Cymbalta  60 mg daily, which she started about six months ago, and has previously been on prednisone , which she discontinued due to weight gain. She uses various topical treatments like lidocaine  patches and Voltaren gel to manage pain.   She has a history of joint injections in her knees and back, which were helpful but are no longer covered by her insurance. She has not had recent imaging studies but recalls previous evaluations where her bloodwork and possibly ultrasounds were abnormal. She has not had fluid removed from her knees despite significant swelling.   Additional symptoms include anxiety, swelling of glands, and numbness and tingling in her hands and feet. She has a history of falling, including a recent fall in a doctor's office, and uses a cane primarily in her left hand to assist with mobility. She has also been diagnosed with plantar fasciitis,  which complicates her ability to wear supportive footwear. No specific joint injuries or surgeries.    11/2023 CBC wnl CMP unremarkable   05/2020 HBV neg HCV neg   10/2022 ESR 50 ANA neg RF neg CCP neg   05/2018 Vit D 11.3   No Rheumatology ROS completed.   PMFS History:  Patient Active Problem List   Diagnosis Date Noted   High risk medication use 04/03/2024   Myofascial pain 04/03/2024   Class 1 obesity with body mass index (BMI) of 31.0 to 31.9 in adult 02/15/2024   Vitamin D  deficiency 12/28/2023   Bilateral primary osteoarthritis of knee 12/28/2023   Insomnia 09/01/2023   DOE (dyspnea on exertion) 09/01/2023   CAP (community acquired pneumonia) 12/29/2022   Essential hypertension 12/29/2022   Hypokalemia 12/29/2022   Pneumonia 12/29/2022   Mild obstructive sleep apnea 10/12/2022   Allergic rhinitis 10/12/2022   Respiratory syncytial virus (RSV) infection 05/25/2022   Gastroesophageal reflux disease 05/25/2022   Depression 05/25/2022   Rheumatoid arthritis (HCC) 05/25/2022   Impaired glucose tolerance 05/24/2022   Severe persistent asthma (HCC) 05/23/2022   Hyperglycemia 05/23/2022   Mixed hyperlipidemia 05/23/2022   Uterine cramping 09/05/2016   Moderate mixed bipolar I  disorder (HCC) 11/07/2013    Past Medical History:  Diagnosis Date   Anemia    Anxiety    Arthritis    orthopedic   Asthma    Bipolar disorder (HCC)    Depression    Fibromyalgia    GERD (gastroesophageal reflux disease)    Gout    Hypotension    IBS (irritable bowel syndrome)    Lyme disease 01/16/2024   Migraine    PTSD (post-traumatic stress disorder)    on prazosin    Sleep apnea    Tonsillar hypertrophy    Uterine fibroid     Family History  Problem Relation Age of Onset   Depression Mother    Hypertension Mother    Hyperlipidemia Mother    Other Mother        bone degeneration   Hypertension Father    Hyperlipidemia Father    Heart attack Father    Diabetes  Father    COPD Father    Depression Brother    Alcohol abuse Brother    Alcohol abuse Brother    Bipolar disorder Daughter    Bipolar disorder Son    ADD / ADHD Son    Bipolar disorder Son    ADD / ADHD Son    Asperger's syndrome Son    Breast cancer Neg Hx    Past Surgical History:  Procedure Laterality Date   ENDOMETRIAL ABLATION     TONSILLECTOMY AND ADENOIDECTOMY N/A 02/13/2018   Procedure: TONSILLECTOMY AND ADENOIDECTOMY;  Surgeon: Karis Clunes, MD;  Location: [facility];  Service: ENT;  Laterality: N/A;   TUBAL LIGATION     Social History   Social History Narrative   Not on file   There is no immunization history for the selected administration types on file for this patient.   Objective: Vital Signs: There were no vitals taken for this visit.   Physical Exam   Musculoskeletal Exam: ***  CDAI Exam: CDAI Score: -- Patient Global: --; Provider Global: -- Swollen: --; Tender: -- Joint Exam 07/03/2024   No joint exam has been documented for this visit   There is currently no information documented on the homunculus. Go to the Rheumatology activity and complete the homunculus joint exam.  Investigation: No additional findings.  Imaging: No results found.  Recent Labs: Lab Results  Component Value Date   WBC 5.7 04/30/2024   HGB 13.0 04/30/2024   PLT 191 04/30/2024   NA 141 04/30/2024   K 3.5 04/30/2024   CL 108 04/30/2024   CO2 24 04/30/2024   GLUCOSE 84 04/30/2024   BUN 12 04/30/2024   CREATININE 0.96 04/30/2024   BILITOT 0.3 04/03/2024   ALKPHOS 77 12/29/2022   AST 9 (L) 04/03/2024   ALT 7 04/03/2024   PROT 7.0 04/03/2024   ALBUMIN 3.1 (L) 12/29/2022   CALCIUM  10.6 (H) 04/30/2024   GFRAA 76 12/15/2023    Speciality Comments: No specialty comments available.  Procedures:  No procedures performed Allergies: Peanut-containing drug products, Tegretol [carbamazepine], Beef allergy, Porcine (pork) protein-containing drug products,  Lithium, Penicillins, and Sweet potato   Assessment / Plan:     Visit Diagnoses: No diagnosis found.  ***  Orders: No orders of the defined types were placed in this encounter.  No orders of the defined types were placed in this encounter.    Follow-Up Instructions: No follow-ups on file.   Eliora Nienhuis M Makyla Bye, CMA  Note - This record has been created using Animal nutritionist.  Chart creation errors have been sought, but may not always  have been located. Such creation errors do not reflect on  the standard of medical care.

## 2024-06-26 ENCOUNTER — Ambulatory Visit (INDEPENDENT_AMBULATORY_CARE_PROVIDER_SITE_OTHER): Admitting: Nurse Practitioner

## 2024-06-26 ENCOUNTER — Encounter: Payer: Self-pay | Admitting: Nurse Practitioner

## 2024-06-26 VITALS — BP 110/78 | HR 67 | Temp 97.7°F | Ht 68.0 in | Wt 199.8 lb

## 2024-06-26 DIAGNOSIS — J4550 Severe persistent asthma, uncomplicated: Secondary | ICD-10-CM | POA: Diagnosis not present

## 2024-06-26 DIAGNOSIS — M069 Rheumatoid arthritis, unspecified: Secondary | ICD-10-CM | POA: Diagnosis not present

## 2024-06-26 DIAGNOSIS — G4700 Insomnia, unspecified: Secondary | ICD-10-CM | POA: Diagnosis not present

## 2024-06-26 DIAGNOSIS — M0609 Rheumatoid arthritis without rheumatoid factor, multiple sites: Secondary | ICD-10-CM

## 2024-06-26 DIAGNOSIS — G4733 Obstructive sleep apnea (adult) (pediatric): Secondary | ICD-10-CM | POA: Diagnosis not present

## 2024-06-26 DIAGNOSIS — F5104 Psychophysiologic insomnia: Secondary | ICD-10-CM

## 2024-06-26 NOTE — Patient Instructions (Addendum)
 Continue Breztri  2 puffs Twice daily. Brush tongue and rinse mouth afterwards Continue Albuterol  inhaler 2 puffs or 3 mL every 6 hours as needed for shortness of breath or wheezing. Notify if symptoms persist despite rescue inhaler/neb use. Continue levocetirizine 5 mg daily for allergies - new prescription sent to pharmacy  Continue Saline nasal rinses (Netti Pot or [employer]) 1-2 times a day. Use the flonase  about 20-30 min after Continue Flonase  nasal spray 1-2 sprays each nostril daily for nasal congestion/drainage  Continue Astelin  nasal spray 2 sprays each nostril Twice daily for sinus drainage, allergies   Asthma action plan: START nebs up to four times a day for worsening shortness of breath, wheezing and cough. If you symptoms do not improve in 24-48 hours, contact us  for steroid course. If your symptoms rapidly worsen, you have trouble talking or extreme difficulty breathing, or you're not getting relief from your nebulizer/rescue, go to the emergency department.   Attend CT chest as previously ordered and lung function testing  Continue prednisone  and methotrexate  prescribed by rheumatology in interim   Increase use CPAP every night, minimum of 4-6 hours a night.  Change equipment as directed. Wash your tubing with warm soap and water daily, hang to dry. Wash humidifier portion weekly. Use bottled, distilled water and change daily Be aware of reduced alertness and do not drive or operate heavy machinery if experiencing this or drowsiness.  Exercise encouraged, as tolerated. Healthy weight management discussed.  Avoid or decrease alcohol consumption and medications that make you more sleepy, if possible. Notify if persistent daytime sleepiness occurs even with consistent use of PAP therapy.   Follow up in 3 months after PFT and CT with Dr. Kara. If symptoms do not improve or worsen, please contact office for sooner follow up or seek emergency care

## 2024-06-26 NOTE — Progress Notes (Signed)
 @Patient  ID: [name], female    DOB: 01/21/1971, 53 y.o.   MRN: [id number]  Chief Complaint  Patient presents with   Medical Management of Chronic Issues    OSA and asthma f.u Pt states seasonal changes were difficult for her asthma. Pt states she is still having SOB and is unable to exercise.  States she has not used her CPAP machine recently because of broken section and traveling.      Referring provider: Maree Isles, MD  HPI: 53 year old patient, never smoker followed for asthma and OSA. She is a patient of Dr. Luann and last seen 02/14/2024 by [facility]. Past medical history significant for GERD, RA on methotrexate , bipolar, depression.   TESTS/EVENTS: 10/06/2022 PFT: FVC 65, FEV1 68, ratio 88, TLC 74, DLCOcor 72 12/28/2022 CTA chest: No PE.  Moderate sized hiatal hernia, stable.  Borderline sized bilateral hilar and mediastinal lymph nodes.  1 cm low-density lesion in the right thyroid  lobe, smaller than prior study.  Parabronchial thickening.  Nodular groundglass opacities throughout the right lung, most pronounced in right lower lobe and posterior right upper lobe.  Few scattered groundglass nodularities in the left lung. Likely infectious/inflammatory 03/29/2023 HST: AHI 10.6/h, spO2 low 70% 04/30/2024 CXR: clear  06/28/2022: OV with Dr. Kara for hospital follow up. She was admitted 11/6-11/8 for asthma exacerbation due to RSV infection. She had been having DOE since    10/10/2022: Ov with Milady Fleener NP for follow up after undergoing PFTs and HST. She was found to have a restrictive defect and minimal diffusion defect on her pulmonary function testing. Her home sleep study revealed mildly moderate OSA. She has never been treated for this with CPAP before.  Today, she tells me that she was doing much better after she started the Breztri  inhaler. Over the past few days (starting approx 3/21), she developed sinus congestion, sneezing, watery eyes. She thought it was allergies.  She now has a cough and is feeling like her chest is a little tighter, especially when she lays back. She also notices more wheezing at night. She feels like she gets more short winded with activities around the house but doesn't feel like her shortness of breath is severe. She denies any fevers, chills, hemoptysis, leg swelling. She is using her Breztri  twice a day. Increased use of albuterol  over the past few days. She is not currently on anything for allergies.  Regarding her sleep, she feels very tired during the day. She snores at night. She denies any drowsy driving or morning headaches. She would like to know what treatment options there are.   02/01/2023: OV with Baley Shands NP for follow up.  She was recently hospitalized from 12/28/2022-12/30/2022 for pneumonia treated with cefdinir  and azithromycin .  Prior to this she had been seen in the emergency department 12/25/2022 for myalgias/arthralgias.  She had, had tick bites prior to this and had been on doxycycline .  She was provided with Norco and discharged home.  She also had viral testing that was negative. She tells me today that she is finally starting to feel better.  She is still having some more shortness of breath compared to her baseline and she does not feel like her stamina is quite back to where it was before.  She does still have an occasional cough but it is primarily dry.  Feels like it is improving.  She has not noticed quite as much wheezing.  Denies any fevers, chills, hemoptysis.  Myalgias and arthralgias are  better as well.  She is still using her nebs twice a day.  She is been able to back off from 4 times a day.  Still on Breztri  twice a day.  Is having difficulties with her taste and smell.  Does not feel like she can taste much of anything and she is not able to smell her candles like she usually is.  She did test negative for COVID so she is not sure why this is.  Does have some sinus symptoms which do not feel much worse than her baseline.   She was using Flonase  a few times a day but then it made her nosebleed. She has been having trouble with her CPAP due to not feeling like she is getting enough air.  She says that she thought this was related to recovering from pneumonia but then she also thinks that it might be a mass problem.  She is used a couple different masks.  Currently settled on nasal pillow which she still does not always feel it gives her enough air at times.  She tends to have to open her mouth to breathe more easily.  She denies any drowsy driving or morning headaches. 01/01/2023-01/30/2023: CPAP auto 5-15 cmH2O 13/30 days; 17% > 4 hours; average use 3 hours 28 minutes Pressure 95th 10.1 Leaks 95th 8.2 AHI 1.9  03/09/2023: OV with Wyat Infinger NP for follow-up.  Has been doing better since she was here last.  She does still have some occasional shortness of breath and sometimes feels like her breath catches.  Notices it more so when she is outside and it is hot.  Cough is mostly gone.  Not noticing any wheezing or chest congestion.  She did have an RA flare and is currently on prednisone  for this.  Denies any fevers, chills, hemoptysis, lower extremity swelling, orthopnea.  She is on Breztri  twice a day.  Taste and smell is getting better.  She has been using Flonase , which helped her sinus symptoms but she does occasionally still have some postnasal drainage.  She feels like this is primarily her allergies that bother her.  She is taking a daily allergy pill which does help. She feels like she has been doing better with her CPAP.  Wearing it most nights.  Occasionally will wake up with it off.  She has been getting calls from [employer] about not wearing her CPAP enough.  She is confused about this because she has been wearing it more consistently.  She was in the hospital before so she is not sure if this is the compliance problem they are talking about.  Denies any drowsy driving or morning headaches.  She does receive benefit from using  her CPAP. 02/07/2023-03/08/2023: CPAP 8-20 cmH2O 29/30 days; 83% >4 hr; average use 6 hr 28 min Pressure 95th 15 Leaks 12.8 AHI 2.3   02/14/2024: OV with Annalucia Laino NP [name] is a 53 year old female with sleep apnea who presents for follow-up on CPAP usage and sleep issues. She has been inconsistent with CPAP usage due to issues with replacement parts and the machine being unplugged. Her grandson recently helped fix the machine, improving its function, but she still requires a new nose piece and hose, which have not been sent by the medical supply company despite multiple calls. When she uses the CPAP, she wakes up with more energy and feels more active. She has a history of Lyme disease and rheumatoid arthritis, which sometimes causes pain at night, leading  her to remove the CPAP mask. Her grandson has noticed that she stops breathing during the night, which concerns him. She experiences sleep disturbances, including nightmares, so they have adjusted her prazosin . She has tried taking Lunesta  for sleep, which helps if she does not eat before taking it. However, eating heavier meals at night reduces its effectiveness and this is typically when she eats. She has bipolar disorder and tends to sleep more during the day and is awake more at night. She also reports seasonal changes affecting her sleep patterns, with increased mania and reduced sleep duration, sometimes only sleeping three hours a night. She denies any SI/HI. No worsening mood symptoms. She has not discussed sleep medications with her psychiatrist. No drowsy driving or sleep parasomnias.  She reports being more active when her rheumatoid arthritis symptoms are not flaring. Her breathing is feeling stable for the most part. No recent flares requiring steroids or abx. No hospitalizations. No cough, wheezing, chest congestion, fevers, hemoptysis. She occasionally uses a nebulizer and rescue inhaler but reports not needing them frequently.  She continues to use Breztri  for her breathing issues. The heat tends to make her breathing worse, so she will work in her garden in the evenings/night. She has a light in her yard.   06/26/2024: Today - follow up Discussed the use of AI scribe software for clinical note transcription with the patient, who gave verbal consent to proceed.  History of Present Illness  [name] is a 53 year old female with asthma and sleep apnea who presents for follow-up.   She had experienced exacerbations of asthma symptoms due to seasonal changes, particularly during the fall pollen season. She has not needed any steroids for her asthma. She is feeling much better going into winter. She currently uses albuterol  once or twice a week, but during peak symptoms, she required daily use due to shortness of breath and phlegm. No significant cough or wheezing now. No chest tightness or PND.   She is experiencing issues with her CPAP machine, specifically a broken clip causing air leakage leading to decreased usage. She contacted her medical supply company about this issue approximately five days ago but has not received a response or replacement part. She sometimes struggles to get air when the machine starts, possibly related to the ramp setting. Despite these issues, she has adjusted to using the CPAP and notices improved rest and energy levels. No drowsy driving. Her bipolar does impact her sleep cycle. Mood has been stable recently.   She has not completed the high-resolution CT scan and repeat lung function testing that were previously ordered. She does have a history of RA. No recent flares since our last visit. Follows with rheumatology.   Socially, her sister has moved in from [place] and is staying with her. Her father is improving in health, now walking without assistance and talking more.    Allergies  Allergen Reactions   Peanut-Containing Drug Products Anaphylaxis    Swelling    Tegretol  [Carbamazepine] Shortness Of Breath and Swelling   Beef Allergy Other (See Comments)    Does not eat red meat   Porcine (Pork) Protein-Containing Drug Products Other (See Comments)    Does not eat pork products   Tramadol Itching   Lithium Other (See Comments)    Bad acne    Penicillins Rash   Sweet Potato Itching    There is no immunization history for the selected administration types on file for this patient.  Past  Medical History:  Diagnosis Date   Anemia    Anxiety    Arthritis    orthopedic   Asthma    Bipolar disorder (HCC)    Depression    Fibromyalgia    GERD (gastroesophageal reflux disease)    Gout    Hypotension    IBS (irritable bowel syndrome)    Lyme disease 01/16/2024   Migraine    PTSD (post-traumatic stress disorder)    on prazosin    Sleep apnea    Tonsillar hypertrophy    Uterine fibroid     Tobacco History: Social History   Tobacco Use  Smoking Status Never   Passive exposure: Past  Smokeless Tobacco Never   Counseling given: Not Answered   Outpatient Medications Prior to Visit  Medication Sig Dispense Refill   acetaminophen  (TYLENOL ) 325 MG tablet Take 2 tablets (650 mg total) by mouth every 6 (six) hours as needed for mild pain (or Fever >/= 101). 100 tablet 0   albuterol  (PROVENTIL ) (2.5 MG/3ML) 0.083% nebulizer solution Take 3 mLs (2.5 mg total) by nebulization every 4 (four) hours as needed for wheezing or shortness of breath. 75 mL 3   albuterol  (VENTOLIN  HFA) 108 (90 Base) MCG/ACT inhaler Inhale 2 puffs into the lungs every 4 (four) hours as needed for wheezing or shortness of breath. 18 g 2   allopurinol  (ZYLOPRIM ) 300 MG tablet Take 300 mg by mouth daily.     ALPRAZolam  (XANAX ) 0.5 MG tablet Take 0.5 mg by mouth 2 (two) times daily as needed for anxiety.     azelastine  (ASTELIN ) 0.1 % nasal spray Place 2 sprays into both nostrils 2 (two) times daily. Use in each nostril as directed 30 mL 5   BELSOMRA 20 MG TABS Take 1 tablet by  mouth at bedtime as needed.     budeson-glycopyrrolate-formoterol  (BREZTRI  AEROSPHERE) 160-9-4.8 MCG/ACT AERO inhaler Inhale 2 puffs into the lungs in the morning and at bedtime. 10.7 g 3   colchicine  0.6 MG tablet Take 0.6 mg by mouth daily.     diclofenac Sodium (VOLTAREN) 1 % GEL Apply 1 Application topically 4 (four) times daily.     DULoxetine  (CYMBALTA ) 60 MG capsule Take 60 mg by mouth daily.     famotidine  (PEPCID ) 20 MG tablet Take 1 tablet (20 mg total) by mouth daily. 30 tablet 11   folic acid  (FOLVITE ) 1 MG tablet Take 1 tablet (1 mg total) by mouth daily. 90 tablet 3   guaiFENesin  (MUCINEX ) 600 MG 12 hr tablet Take 1 tablet (600 mg total) by mouth 2 (two) times daily as needed for to loosen phlegm. 60 tablet 0   hydrocortisone  2.5 % cream Apply topically 2 (two) times daily as needed. 30 g 0   hydrOXYzine  (ATARAX ) 25 MG tablet Take 25 mg by mouth 2 (two) times daily.     ipratropium-albuterol  (DUONEB) 0.5-2.5 (3) MG/3ML SOLN Take 3 mLs by nebulization every 6 (six) hours as needed. 360 mL 3   LATUDA  40 MG TABS tablet Take 40 mg by mouth at bedtime.     levocetirizine (XYZAL ) 5 MG tablet take 1 tablet (5 MILLIGRAM total) by mouth daily. 30 tablet 11   methotrexate  (RHEUMATREX) 2.5 MG tablet Take 10 tablets (25 mg total) by mouth once a week. 130 tablet 0   montelukast  (SINGULAIR ) 10 MG tablet Take 1 tablet (10 mg total) by mouth at bedtime. 30 tablet 11   MOUNJARO 7.5 MG/0.5ML Pen      oxcarbazepine  (TRILEPTAL ) 600  MG tablet Take 600 mg by mouth daily.     pantoprazole  (PROTONIX ) 40 MG tablet Take 1 tablet (40 mg total) by mouth daily. 30 tablet 1   prazosin  (MINIPRESS ) 5 MG capsule Take 5 mg by mouth 3 (three) times daily. @ Bedtime     pregabalin  (LYRICA ) 150 MG capsule Take 150 mg by mouth 2 (two) times daily.     promethazine (PHENERGAN) 25 MG tablet Take 25 mg by mouth every 6 (six) hours as needed.     vitamin B-12 (CYANOCOBALAMIN ) 100 MCG tablet Take 100 mcg by mouth daily.      Vitamin D -Vitamin K (D3 + K2 PO) Take by mouth.     atorvastatin  (LIPITOR) 40 MG tablet Take 40 mg by mouth daily. (Patient not taking: Reported on 06/26/2024)     cholecalciferol (VITAMIN D3) 25 MCG (1000 UNIT) tablet Take 4,000 Units by mouth daily. (Patient not taking: Reported on 06/26/2024)     furosemide  (LASIX ) 20 MG tablet Take 1 tablet (20 mg total) by mouth daily. (Patient not taking: Reported on 06/26/2024) 30 tablet 1   Lurasidone  HCl 60 MG TABS Take 1 tablet by mouth daily. (Patient not taking: Reported on 06/26/2024)     metFORMIN  (GLUCOPHAGE ) 500 MG tablet Take 1 tablet (500 mg total) by mouth daily with breakfast. (Patient not taking: Reported on 06/26/2024) 90 tablet 3   mirabegron  ER (MYRBETRIQ ) 25 MG TB24 tablet Take 25 mg by mouth daily. (Patient not taking: Reported on 06/26/2024)     tiZANidine  (ZANAFLEX ) 4 MG tablet Take 1 tablet (4 mg total) by mouth at bedtime as needed for muscle spasms. (Patient not taking: Reported on 06/26/2024) 30 tablet 1   Turmeric (QC TUMERIC COMPLEX) 500 MG CAPS Take by mouth. (Patient not taking: Reported on 06/26/2024)     UNABLE TO FIND daily. Med Name: Vitaglobe-Mushroom gummie (Patient not taking: Reported on 06/26/2024)     azithromycin  (ZITHROMAX ) 250 MG tablet Azithromycin  500mg  x 1 daily and then next 4 days is 250mg  daily x 4 days (Patient not taking: Reported on 04/03/2024) 6 tablet 0   No facility-administered medications prior to visit.     Review of Systems: as above    Physical Exam:  BP 110/78   Pulse 67   Temp 97.7 F (36.5 C)   Ht 5' 8 (1.727 m) Comment: Per pt  Wt 199 lb 12.8 oz (90.6 kg)   SpO2 98% Comment: RA  BMI 30.38 kg/m   GEN: Pleasant, interactive, well-appearing; obese; in no acute distress. HEENT:  Normocephalic and atraumatic. PERRLA. Sclera white. Nasal turbinates pink, moist and patent bilaterally. No rhinorrhea present. Oropharynx pink and moist, without exudate or edema. No lesions, ulcerations,  or postnasal drip.  NECK:  Supple w/ fair ROM. No lymphadenopathy.   CV: RRR, no m/r/g, no peripheral edema. Pulses intact, +2 bilaterally. No cyanosis, pallor or clubbing. PULMONARY:  Unlabored, regular breathing. Clear bilaterally A&P w/o wheezes/rales/rhonchi. No accessory muscle use.  GI: BS present and normoactive. Soft, non-tender to palpation. MSK: No erythema, warmth or tenderness. Cap refil <2 sec all extrem.  Neuro: A/Ox3. No focal deficits noted.   Skin: Warm, no lesions or rashe Psych: Normal affect and behavior. Judgement and thought content appropriate.     Lab Results:  CBC    Component Value Date/Time   WBC 5.7 04/30/2024 1802   RBC 4.35 04/30/2024 1802   HGB 13.0 04/30/2024 1802   HCT 39.8 04/30/2024 1802   PLT 191 04/30/2024 1802  MCV 91.5 04/30/2024 1802   MCH 29.9 04/30/2024 1802   MCHC 32.7 04/30/2024 1802   RDW 15.2 04/30/2024 1802   LYMPHSABS 1.7 12/29/2022 0554   MONOABS 1.3 (H) 12/29/2022 0554   EOSABS 39 04/03/2024 0917   BASOSABS 31 04/03/2024 0917    BMET    Component Value Date/Time   NA 141 04/30/2024 1802   K 3.5 04/30/2024 1802   CL 108 04/30/2024 1802   CO2 24 04/30/2024 1802   GLUCOSE 84 04/30/2024 1802   BUN 12 04/30/2024 1802   CREATININE 0.96 04/30/2024 1802   CREATININE 0.76 04/03/2024 0917   CALCIUM  10.6 (H) 04/30/2024 1802   CALCIUM  9.9 06/22/2023 0000   GFRNONAA >60 04/30/2024 1802   GFRAA 76 12/15/2023 0733    BNP    Component Value Date/Time   BNP 13.0 12/28/2022 2019     Imaging:  No results found.  Administration History     None          Latest Ref Rng & Units 10/06/2022    2:23 PM  PFT Results  FVC-Pre L 2.63   FVC-Predicted Pre % 65   FVC-Post L 2.64   FVC-Predicted Post % 65   Pre FEV1/FVC % % 84   Post FEV1/FCV % % 88   FEV1-Pre L 2.20   FEV1-Predicted Pre % 68   FEV1-Post L 2.33   DLCO uncorrected ml/min/mmHg 17.40   DLCO UNC% % 72   DLCO corrected ml/min/mmHg 17.40   DLCO COR  %Predicted % 72   DLVA Predicted % 116   TLC L 4.18   TLC % Predicted % 74   RV % Predicted % 76     No results found for: NITRICOXIDE      Assessment & Plan:   Severe persistent asthma Improved following fall allergy season. Clinically stable. She was supposed to have HRCT and PFT to rule out RA ILD given prior complaints of DOE but these were not completed. Seems to have improved but encouraged to obtain these for monitoring/baseline. Will follow up on scheduling. Continue aggressive maintenance regimen. Action plan in place. Avoid triggers.   Patient Instructions  Continue Breztri  2 puffs Twice daily. Brush tongue and rinse mouth afterwards Continue Albuterol  inhaler 2 puffs or 3 mL every 6 hours as needed for shortness of breath or wheezing. Notify if symptoms persist despite rescue inhaler/neb use. Continue levocetirizine 5 mg daily for allergies - new prescription sent to pharmacy  Continue Saline nasal rinses (Netti Pot or [employer]) 1-2 times a day. Use the flonase  about 20-30 min after Continue Flonase  nasal spray 1-2 sprays each nostril daily for nasal congestion/drainage  Continue Astelin  nasal spray 2 sprays each nostril Twice daily for sinus drainage, allergies   Asthma action plan: START nebs up to four times a day for worsening shortness of breath, wheezing and cough. If you symptoms do not improve in 24-48 hours, contact us  for steroid course. If your symptoms rapidly worsen, you have trouble talking or extreme difficulty breathing, or you're not getting relief from your nebulizer/rescue, go to the emergency department.   Attend CT chest as previously ordered and lung function testing  Continue prednisone  and methotrexate  prescribed by rheumatology in interim   Increase use CPAP every night, minimum of 4-6 hours a night.  Change equipment as directed. Wash your tubing with warm soap and water daily, hang to dry. Wash humidifier portion weekly. Use bottled,  distilled water and change daily Be aware of  reduced alertness and do not drive or operate heavy machinery if experiencing this or drowsiness.  Exercise encouraged, as tolerated. Healthy weight management discussed.  Avoid or decrease alcohol consumption and medications that make you more sleepy, if possible. Notify if persistent daytime sleepiness occurs even with consistent use of PAP therapy.   Follow up in 3 months after PFT and CT with Dr. Kara. If symptoms do not improve or worsen, please contact office for sooner follow up or seek emergency care   Mild obstructive sleep apnea Mild OSA on CPAP. She has suboptimal compliance due to difficulties with her machine and sleep disturbances related to bipolar disorder. Advised to follow up with DME on supplies order. Receives benefit from use. Encouraged to increase usage. Minimal cardiovascular risks associated with mild OSA. Advised to discuss sleep disturbances and pharmacological therapy options with psychiatrist. Aware of proper care/use. Safe driving practices reviewed.    Insomnia See above. Sleep hygiene reviewed  Rheumatoid arthritis (HCC) See above. Follow up with rheumatology as scheduled       I spent 35 minutes of dedicated to the care of this patient on the date of this encounter to include pre-visit review of records, face-to-face time with the patient discussing conditions above, post visit ordering of testing, clinical documentation with the electronic health record, making appropriate referrals as documented, and communicating necessary findings to members of the patients care team.  Comer Jordan Rouleau, NP 06/27/2024  Pt aware and understands NP's role.

## 2024-06-27 ENCOUNTER — Encounter: Payer: Self-pay | Admitting: Nurse Practitioner

## 2024-06-27 NOTE — Assessment & Plan Note (Signed)
 Improved following fall allergy season. Clinically stable. She was supposed to have HRCT and PFT to rule out RA ILD given prior complaints of DOE but these were not completed. Seems to have improved but encouraged to obtain these for monitoring/baseline. Will follow up on scheduling. Continue aggressive maintenance regimen. Action plan in place. Avoid triggers.   Patient Instructions  Continue Breztri  2 puffs Twice daily. Brush tongue and rinse mouth afterwards Continue Albuterol  inhaler 2 puffs or 3 mL every 6 hours as needed for shortness of breath or wheezing. Notify if symptoms persist despite rescue inhaler/neb use. Continue levocetirizine 5 mg daily for allergies - new prescription sent to pharmacy  Continue Saline nasal rinses (Netti Pot or [employer]) 1-2 times a day. Use the flonase  about 20-30 min after Continue Flonase  nasal spray 1-2 sprays each nostril daily for nasal congestion/drainage  Continue Astelin  nasal spray 2 sprays each nostril Twice daily for sinus drainage, allergies   Asthma action plan: START nebs up to four times a day for worsening shortness of breath, wheezing and cough. If you symptoms do not improve in 24-48 hours, contact us  for steroid course. If your symptoms rapidly worsen, you have trouble talking or extreme difficulty breathing, or you're not getting relief from your nebulizer/rescue, go to the emergency department.   Attend CT chest as previously ordered and lung function testing  Continue prednisone  and methotrexate  prescribed by rheumatology in interim   Increase use CPAP every night, minimum of 4-6 hours a night.  Change equipment as directed. Wash your tubing with warm soap and water daily, hang to dry. Wash humidifier portion weekly. Use bottled, distilled water and change daily Be aware of reduced alertness and do not drive or operate heavy machinery if experiencing this or drowsiness.  Exercise encouraged, as tolerated. Healthy weight management  discussed.  Avoid or decrease alcohol consumption and medications that make you more sleepy, if possible. Notify if persistent daytime sleepiness occurs even with consistent use of PAP therapy.   Follow up in 3 months after PFT and CT with Dr. Kara. If symptoms do not improve or worsen, please contact office for sooner follow up or seek emergency care

## 2024-06-27 NOTE — Assessment & Plan Note (Signed)
 See above. Follow up with rheumatology as scheduled.

## 2024-06-27 NOTE — Assessment & Plan Note (Signed)
 Mild OSA on CPAP. She has suboptimal compliance due to difficulties with her machine and sleep disturbances related to bipolar disorder. Advised to follow up with DME on supplies order. Receives benefit from use. Encouraged to increase usage. Minimal cardiovascular risks associated with mild OSA. Advised to discuss sleep disturbances and pharmacological therapy options with psychiatrist. Aware of proper care/use. Safe driving practices reviewed.

## 2024-06-27 NOTE — Assessment & Plan Note (Signed)
See above. Sleep hygiene reviewed.

## 2024-07-03 ENCOUNTER — Ambulatory Visit: Admitting: Internal Medicine

## 2024-07-03 DIAGNOSIS — Z79899 Other long term (current) drug therapy: Secondary | ICD-10-CM

## 2024-07-03 DIAGNOSIS — M0609 Rheumatoid arthritis without rheumatoid factor, multiple sites: Secondary | ICD-10-CM

## 2024-08-16 ENCOUNTER — Telehealth: Payer: Self-pay | Admitting: Nurse Practitioner

## 2024-08-16 NOTE — Telephone Encounter (Signed)
 Copied from CRM [id number]. Topic: Appointments - Appointment Scheduling >> Aug 15, 2024  4:47 PM Rilla B wrote: Reason for CRM: Patient returning call from office. Patient has order for CT @ [facility]. Per CS, they do not schedule these @ [facility]. Please call patient to schedule [phone]  Returned patient's call to discuss scheduling---no answer and voice mail is full---the only CT order I see for this patient is from February 2025

## 2024-08-20 NOTE — Telephone Encounter (Signed)
 Patient scheduled for Tuesday 08/27/24 at 6:00 pm at [facility] time is 5:45 pm 1st floor registration desk for check in---she voiced her understanding

## 2024-08-22 ENCOUNTER — Encounter

## 2024-08-27 ENCOUNTER — Ambulatory Visit (HOSPITAL_COMMUNITY)

## 2024-09-24 ENCOUNTER — Ambulatory Visit: Admitting: Nurse Practitioner

## 2024-09-26 ENCOUNTER — Encounter: Admitting: Nutrition

## 2024-10-03 ENCOUNTER — Ambulatory Visit: Admitting: Pulmonary Disease
